# Patient Record
Sex: Female | Born: 1980
Health system: Southern US, Community
[De-identification: ages and names within clinical notes are randomized; demographics above are authoritative.]

## PROBLEM LIST (undated history)

## (undated) DIAGNOSIS — F3181 Bipolar II disorder: Secondary | ICD-10-CM

## (undated) DIAGNOSIS — J302 Other seasonal allergic rhinitis: Secondary | ICD-10-CM

## (undated) DIAGNOSIS — F319 Bipolar disorder, unspecified: Secondary | ICD-10-CM

## (undated) DIAGNOSIS — R519 Headache, unspecified: Secondary | ICD-10-CM

## (undated) DIAGNOSIS — F32A Depression, unspecified: Secondary | ICD-10-CM

## (undated) DIAGNOSIS — T7840XA Allergy, unspecified, initial encounter: Secondary | ICD-10-CM

## (undated) DIAGNOSIS — E785 Hyperlipidemia, unspecified: Secondary | ICD-10-CM

## (undated) DIAGNOSIS — F909 Attention-deficit hyperactivity disorder, unspecified type: Secondary | ICD-10-CM

## (undated) DIAGNOSIS — F419 Anxiety disorder, unspecified: Secondary | ICD-10-CM

## (undated) DIAGNOSIS — F329 Major depressive disorder, single episode, unspecified: Secondary | ICD-10-CM

## (undated) DIAGNOSIS — O139 Gestational [pregnancy-induced] hypertension without significant proteinuria, unspecified trimester: Secondary | ICD-10-CM

## (undated) DIAGNOSIS — B999 Unspecified infectious disease: Secondary | ICD-10-CM

## (undated) HISTORY — PX: APPENDECTOMY: SHX54

## (undated) HISTORY — DX: Attention-deficit hyperactivity disorder, unspecified type: F90.9

## (undated) HISTORY — DX: Depression, unspecified: F32.A

## (undated) HISTORY — PX: TONSILLECTOMY: SUR1361

## (undated) HISTORY — PX: BUNIONECTOMY: SHX129

## (undated) HISTORY — DX: Bipolar II disorder: F31.81

## (undated) HISTORY — PX: TUBAL LIGATION: SHX77

## (undated) HISTORY — DX: Other seasonal allergic rhinitis: J30.2

## (undated) HISTORY — DX: Hyperlipidemia, unspecified: E78.5

## (undated) HISTORY — DX: Anxiety disorder, unspecified: F41.9

## (undated) HISTORY — DX: Allergy, unspecified, initial encounter: T78.40XA

## (undated) HISTORY — DX: Major depressive disorder, single episode, unspecified: F32.9

---

## 1999-11-01 ENCOUNTER — Emergency Department (HOSPITAL_COMMUNITY): Admission: EM | Admit: 1999-11-01 | Discharge: 1999-11-01 | Payer: Self-pay | Admitting: Emergency Medicine

## 1999-11-01 ENCOUNTER — Encounter: Payer: Self-pay | Admitting: Emergency Medicine

## 2000-05-22 ENCOUNTER — Encounter (INDEPENDENT_AMBULATORY_CARE_PROVIDER_SITE_OTHER): Payer: Self-pay

## 2000-05-23 ENCOUNTER — Encounter: Payer: Self-pay | Admitting: Emergency Medicine

## 2000-05-23 ENCOUNTER — Inpatient Hospital Stay (HOSPITAL_COMMUNITY): Admission: EM | Admit: 2000-05-23 | Discharge: 2000-05-25 | Payer: Self-pay | Admitting: Emergency Medicine

## 2003-04-27 ENCOUNTER — Other Ambulatory Visit: Admission: RE | Admit: 2003-04-27 | Discharge: 2003-04-27 | Payer: Self-pay | Admitting: Unknown Physician Specialty

## 2004-05-21 ENCOUNTER — Other Ambulatory Visit: Admission: RE | Admit: 2004-05-21 | Discharge: 2004-05-21 | Payer: Self-pay | Admitting: Family Medicine

## 2005-09-16 ENCOUNTER — Other Ambulatory Visit: Admission: RE | Admit: 2005-09-16 | Discharge: 2005-09-16 | Payer: Self-pay | Admitting: Family Medicine

## 2006-09-13 ENCOUNTER — Other Ambulatory Visit: Admission: RE | Admit: 2006-09-13 | Discharge: 2006-09-13 | Payer: Self-pay | Admitting: Family Medicine

## 2007-04-05 ENCOUNTER — Ambulatory Visit (HOSPITAL_BASED_OUTPATIENT_CLINIC_OR_DEPARTMENT_OTHER): Admission: RE | Admit: 2007-04-05 | Discharge: 2007-04-05 | Payer: Self-pay | Admitting: Orthopedic Surgery

## 2007-10-24 ENCOUNTER — Other Ambulatory Visit: Admission: RE | Admit: 2007-10-24 | Discharge: 2007-10-24 | Payer: Self-pay | Admitting: Family Medicine

## 2007-12-26 ENCOUNTER — Other Ambulatory Visit: Admission: RE | Admit: 2007-12-26 | Discharge: 2007-12-26 | Payer: Self-pay | Admitting: Family Medicine

## 2008-10-29 ENCOUNTER — Other Ambulatory Visit: Admission: RE | Admit: 2008-10-29 | Discharge: 2008-10-29 | Payer: Self-pay | Admitting: Family Medicine

## 2009-03-11 ENCOUNTER — Encounter: Admission: RE | Admit: 2009-03-11 | Discharge: 2009-03-11 | Payer: Self-pay | Admitting: Family Medicine

## 2009-12-31 ENCOUNTER — Other Ambulatory Visit: Admission: RE | Admit: 2009-12-31 | Discharge: 2009-12-31 | Payer: Self-pay | Admitting: Family Medicine

## 2010-12-23 NOTE — Op Note (Signed)
Tanya Moore, Tanya Moore               ACCOUNT NO.:  [id number]   MEDICAL RECORD NO.:  [id number]          PATIENT TYPE:  AMB   LOCATION:  DSC                          FACILITY:  [facility]   PHYSICIAN:  Leonides Grills, M.D.     DATE OF BIRTH:  [birth year]   DATE OF PROCEDURE:  04/04/2007  DATE OF DISCHARGE:                               OPERATIVE REPORT   PREOPERATIVE DIAGNOSIS:  Left hallux valgus.   POSTOPERATIVE DIAGNOSIS:  Left hallux valgus.   OPERATION:  1. Left Chevron bunionectomy.  2. Stress x-rays, left foot.   ANESTHESIA:  General.   SURGEON:  Leonides Grills, MD   ASSISTANT:  Evlyn Kanner, P.A.-C   ESTIMATED BLOOD LOSS:  Minimal.   TOURNIQUET TIME:  Approximately 40 minutes.   COMPLICATIONS:  None.   DISPOSITION:  Stable to PAR.   INDICATIONS:  This is a 30 year old female who has had longstanding left  bunion pain that is interfering with her life.  We were contemplating  doing a proximal metatarsal osteotomy if her correction was not  adequately corrected with a distal metatarsal osteotomy.  She had an  elevated DMMA angle to the metatarsal head as well, and this would also  have to be factored into the correction.  She was consented for the  above procedure.  All risks, which include infection, nerve or vessel  injury, nonunion, malunion, hardware rotation, hardware failure,  persistent pain, worse pain, prolonged recovery, stiffness, and  arthritis, were all explained.  Questions were encouraged and answered.   OPERATION:  The patient was brought to the operating and placed in  supine position after adequate general endotracheal tube anesthesia was  administered as well as Ancef 1 g IV piggyback.  The left lower  extremity is then prepped and draped in a sterile manner over a  proximally-placed thigh tourniquet.  The limb was gravity-exsanguinated.  The tourniquet was elevated to 290 mmHg.  A longitudinal incision over  the medial midline over the medial aspect of  the left great toe MTP  joint was then made.  Dissection was carried down through skin.  Hemostasis was obtained.  Neurovascular structures were identified both  superiorly and inferiorly and protected throughout the case.  L-shaped  capsulotomy was then made.  Simple bunionectomy was then performed with  a sagittal saw.  Rocky Link Johnson's ridge was then rounded off with a  rongeur.  The area and joint were copiously irrigated with be normal  saline.  The lateral capsule was then released with a curved Beaver  blade.  This had an excellent release of the tight capsule.  The area  was copiously irrigated with normal saline.  We then found the center of  the head medially.  A Chevron osteotomy was then made with a sagittal  saw.  We then took off more bone medially to correct the DMMA angle, and  this closed down beautifully.  The osteotomy was then verified to be in  a proper position with x-ray.  We then fixed this was a 2.0 mm fully-  threaded cortical screw with a 1.5-mm drill hole, respectively.  This  was countersunk.  This had excellent purchase and maintenance of the  correction.  The redundant bone medially was then trimmed off with a  sagittal saw.  The joint and area were copiously irrigated with normal  saline.  The capsule was advanced both superiorly and proximally and  repaired with a 2-0 Vicryl stitch.  This had an Conservation officer, historic buildings.  We  then obtained stress x-ray in AP and lateral views that showed no gross  motion across the osteotomy site, fixation, proper position and  excellent alignment as well.  Sesamoids were located.  It was the  determination at this point that we made over-correct with a proximal  metatarsal osteotomy.  We decided not to perform this.  This was  discussed with the patient as well preoperatively.  The tourniquet was  deflated.  Hemostasis was obtained.  The skin was closed with 4-0 nylon  stitch.  Sterile dressing was applied.  Roger Mann dressing was  applied.  Hard-sole shoe was applied.  The patient was stable to the PAR.      Leonides Grills, M.D.  Electronically Signed     PB/MEDQ  D:  04/05/2007  T:  04/05/2007  Job:  [id number]

## 2010-12-26 NOTE — Op Note (Signed)
[facility]  Patient:    Tanya Moore, Tanya Moore                      MRN: [id number] Proc. Date: 05/23/00 Adm. Date:  [id number] Attending:  Carson Myrtle                           Operative Report  PREOPERATIVE DIAGNOSIS:  Acute appendicitis.  POSTOPERATIVE DIAGNOSIS:  Acute appendicitis.  PROCEDURE:  Laparoscopic appendectomy.  SURGEON:  Timothy E. Earlene Plater, M.D.  ANESTHESIA:  General.  INDICATION:  [name] was seen and evaluated in the emergency room, admitted directly to the operating room for appendectomy.  She is a 30 year old Caucasian female from [place], in school at [employer], otherwise healthy, no prior surgeries, and no contributing comorbidities.  DESCRIPTION OF PROCEDURE:  The patient was brought to the operating room, placed supine, and general endotracheal anesthesia administered, a Foley catheter and orogastric tube applied.  The abdomen was scrubbed, prepped and draped in the usual fashion.  Marcaine 0.25% was used prior to each puncture. The infraumbilical incision made horizontally, fascia identified, and opened vertically, peritoneum entered without complication.  Hasson catheter placed, tied in place, the abdomen insufflated.  Peritoneoscopy revealed some omental caking in the right lower quadrant over the cecum.  The gallbladder was intrahepatic.  The uterus and both tubes and ovaries appeared normal.  No other pathology was noted.  A 5 mm trocar placed in the right upper quadrant and a 12 mm trocar placed in the left lower quadrant.  The omentum was removed.  The appendix was inflamed at its midportion.  The appendix had some congenital adhesions to the right abdominal wall.  These were taken down sharply with cautery.  The appendix was then free.  A window was made in the mesentery and using two firings of the GIA staple device, the mesentery and then the appendix was cut across and divided.  The appendix was placed  in an Endocatch bag.  Then attention was turned to the operative sites.  There was a bit of bleeding at the suture line on the appendix.  This was touched with cautery and stopped immediately.  Copious irrigation was carried out.  The appendix was then removed in the Endocatch bag through the left lower quadrant puncture site.  There was some bleeding on the peritoneal side.  This was cauterized under direct vision.  Irrigation, CO2, instruments, and trocars all removed under direct vision.  The left lower quadrant site was then inspected. Some bleeding at the skin level was cauterized, and the fascia was closed with a single suture of #1 Vicryl.  The infraumbilical site was closed with a #1 Vicryl as well.  Some bleeding at the skin site there was cauterized, and some bleeding at the right upper quadrant puncture site was cauterized.  These wounds were now dry.  Counts were correct.  All the skin incisions were closed with interrupted 4-0 Vicryl.  Steri-Strips applied.  Dry sterile dressings applied.  She tolerated it well, was awakened, and sent to the recovery room in good condition. DD:  05/23/00 TD:  05/24/00 Job: [id number]

## 2012-01-25 ENCOUNTER — Other Ambulatory Visit: Payer: Self-pay | Admitting: Family Medicine

## 2012-01-25 ENCOUNTER — Other Ambulatory Visit (HOSPITAL_COMMUNITY)
Admission: RE | Admit: 2012-01-25 | Discharge: 2012-01-25 | Disposition: A | Discharge status: Home / Self Care | Payer: BC Managed Care – PPO | Source: Ambulatory Visit | Attending: Family Medicine | Admitting: Family Medicine

## 2012-01-25 DIAGNOSIS — Z124 Encounter for screening for malignant neoplasm of cervix: Secondary | ICD-10-CM | POA: Insufficient documentation

## 2012-01-25 DIAGNOSIS — Z1159 Encounter for screening for other viral diseases: Secondary | ICD-10-CM | POA: Insufficient documentation

## 2012-01-25 DIAGNOSIS — Z113 Encounter for screening for infections with a predominantly sexual mode of transmission: Secondary | ICD-10-CM | POA: Insufficient documentation

## 2013-05-06 ENCOUNTER — Ambulatory Visit (INDEPENDENT_AMBULATORY_CARE_PROVIDER_SITE_OTHER): Payer: BC Managed Care – PPO | Admitting: Family Medicine

## 2013-05-06 VITALS — BP 110/62 | HR 82 | Temp 98.9°F | Resp 16 | Wt 143.2 lb

## 2013-05-06 DIAGNOSIS — J329 Chronic sinusitis, unspecified: Secondary | ICD-10-CM

## 2013-05-06 MED ORDER — AMOXICILLIN 875 MG PO TABS: 875.0000 mg | ORAL_TABLET | Freq: Two times a day (BID) | ORAL | Status: DC

## 2013-05-06 MED ORDER — HYDROCODONE-HOMATROPINE 5-1.5 MG/5ML PO SYRP: 5.0000 mL | ORAL_SOLUTION | Freq: Three times a day (TID) | ORAL | Status: DC | PRN

## 2013-05-06 NOTE — Progress Notes (Signed)
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   The vomiting has cleared now, but nausea still comes on occasionally.  F/H:  Heart disease PMHx:  High cholesterol  Objective:  NAD HEENT:  Swollen nasal passages, mildly erythematous throat Chest:  No rales, few ronchi Heart:  Reg, no murmur Skin:  Diffuse tattoos, no rash  Assessment: sinus infection  Plan:  Amoxicillin and hydromet Off work until Monday. Sinusitis - Plan: amoxicillin (AMOXIL) 875 MG tablet, HYDROcodone-homatropine (HYCODAN) 5-1.5 MG/5ML syrup  Signed, Elvina Sidle, MD

## 2018-08-10 DIAGNOSIS — L409 Psoriasis, unspecified: Secondary | ICD-10-CM

## 2018-08-10 HISTORY — DX: Psoriasis, unspecified: L40.9

## 2018-10-26 ENCOUNTER — Telehealth: Payer: Self-pay | Admitting: Family Medicine

## 2018-10-26 DIAGNOSIS — F319 Bipolar disorder, unspecified: Secondary | ICD-10-CM

## 2018-10-26 NOTE — Telephone Encounter (Signed)
Patient called and left message on her phone explaining that we are trying to offer options to avoid unnecessary in person visits if there needs could be met over the phone given everything going on with a coronavirus.  Explained that we are clearly not refusing to see them that we are still welcome to see them in person if that is what they want but if they think that we can manage some issues over the phone they can call and we can reschedule her visit to a later time when there is less risk here in the office.  Dr.  

## 2018-10-26 NOTE — Telephone Encounter (Signed)
Patient was scheduled for establishing care visit as a new patient.  She had moved away years ago and has her new PCP for moving back in town.  Main concern was around trying to transition her primary mental health care, she is currently at [facility] but is not pleased with her experience there and would like to try and get a new psychiatrist.  She says that she is bipolar 1, says she is safe with no SI HI and is on multiple medications which she cannot remember all of them off the top of her head but that the psychiatric team will continue to manage her psychiatric medications.  I told her that we could place a referral for that and that it might take weeks to months to get a new person as she is on Medicaid and she is comfortable without currently and will continue with [facility] until that happens.  Only other medical term that she mentions is that she has a history of high blood cholesterol she said that she used to be on atorvastatin which she has not had lab work in years.  She does not think that this is an immediate concern enough that we need to stress over this in the next 5 or 6 weeks while the coronavirus crisis is going on.  So she will schedule an appointment at a later date to come in and get her establishing blood work set.  She understands that she is able to come and see [place] in person if she has a visit or need that should happen face-to-face she is comfortable with that at this time.  Dr. Parke Simmers

## 2018-10-28 ENCOUNTER — Ambulatory Visit: Payer: Self-pay | Admitting: Family Medicine

## 2018-11-07 ENCOUNTER — Telehealth: Payer: Self-pay | Admitting: Family Medicine

## 2018-11-07 DIAGNOSIS — Z3490 Encounter for supervision of normal pregnancy, unspecified, unspecified trimester: Secondary | ICD-10-CM

## 2018-11-07 NOTE — Telephone Encounter (Signed)
Patient calls in requesting referral to OB because she has found she is pregnant.  -referal placed  Dr. Parke Simmers

## 2018-11-07 NOTE — Telephone Encounter (Signed)
Patient called stating that she just found out that she's pregnant. Patient also stated that she doesn't have an OB and would like a referral.

## 2018-11-08 ENCOUNTER — Other Ambulatory Visit: Payer: Self-pay

## 2018-11-08 ENCOUNTER — Encounter (HOSPITAL_COMMUNITY): Payer: Self-pay | Admitting: *Deleted

## 2018-11-08 ENCOUNTER — Inpatient Hospital Stay (HOSPITAL_COMMUNITY)
Admission: AD | Admit: 2018-11-08 | Discharge: 2018-11-08 | Disposition: A | Discharge status: Home / Self Care | Payer: Medicaid Other | Attending: Obstetrics & Gynecology | Admitting: Obstetrics & Gynecology

## 2018-11-08 ENCOUNTER — Telehealth: Payer: Self-pay

## 2018-11-08 ENCOUNTER — Inpatient Hospital Stay (HOSPITAL_COMMUNITY): Payer: Medicaid Other

## 2018-11-08 DIAGNOSIS — Z882 Allergy status to sulfonamides status: Secondary | ICD-10-CM | POA: Diagnosis not present

## 2018-11-08 DIAGNOSIS — Z87891 Personal history of nicotine dependence: Secondary | ICD-10-CM | POA: Insufficient documentation

## 2018-11-08 DIAGNOSIS — F419 Anxiety disorder, unspecified: Secondary | ICD-10-CM | POA: Insufficient documentation

## 2018-11-08 DIAGNOSIS — E785 Hyperlipidemia, unspecified: Secondary | ICD-10-CM | POA: Diagnosis not present

## 2018-11-08 DIAGNOSIS — Z3A01 Less than 8 weeks gestation of pregnancy: Secondary | ICD-10-CM | POA: Insufficient documentation

## 2018-11-08 DIAGNOSIS — F319 Bipolar disorder, unspecified: Secondary | ICD-10-CM | POA: Diagnosis not present

## 2018-11-08 DIAGNOSIS — O4691 Antepartum hemorrhage, unspecified, first trimester: Secondary | ICD-10-CM | POA: Insufficient documentation

## 2018-11-08 DIAGNOSIS — Z79899 Other long term (current) drug therapy: Secondary | ICD-10-CM | POA: Diagnosis not present

## 2018-11-08 DIAGNOSIS — Z8042 Family history of malignant neoplasm of prostate: Secondary | ICD-10-CM | POA: Insufficient documentation

## 2018-11-08 DIAGNOSIS — O99281 Endocrine, nutritional and metabolic diseases complicating pregnancy, first trimester: Secondary | ICD-10-CM | POA: Insufficient documentation

## 2018-11-08 DIAGNOSIS — O99341 Other mental disorders complicating pregnancy, first trimester: Secondary | ICD-10-CM | POA: Insufficient documentation

## 2018-11-08 DIAGNOSIS — Z792 Long term (current) use of antibiotics: Secondary | ICD-10-CM | POA: Insufficient documentation

## 2018-11-08 DIAGNOSIS — Z6740 Type O blood, Rh positive: Secondary | ICD-10-CM | POA: Diagnosis not present

## 2018-11-08 DIAGNOSIS — O3680X0 Pregnancy with inconclusive fetal viability, not applicable or unspecified: Secondary | ICD-10-CM | POA: Insufficient documentation

## 2018-11-08 DIAGNOSIS — Z818 Family history of other mental and behavioral disorders: Secondary | ICD-10-CM | POA: Insufficient documentation

## 2018-11-08 DIAGNOSIS — O4690 Antepartum hemorrhage, unspecified, unspecified trimester: Secondary | ICD-10-CM

## 2018-11-08 HISTORY — DX: Bipolar disorder, unspecified: F31.9

## 2018-11-08 LAB — CBC
HCT: 40.8 % (ref 36.0–46.0)
Hemoglobin: 13.3 g/dL (ref 12.0–15.0)
MCH: 28.5 pg (ref 26.0–34.0)
MCHC: 32.6 g/dL (ref 30.0–36.0)
MCV: 87.4 fL (ref 80.0–100.0)
NRBC: 0 % (ref 0.0–0.2)
PLATELETS: 315 10*3/uL (ref 150–400)
RBC: 4.67 MIL/uL (ref 3.87–5.11)
RDW: 13 % (ref 11.5–15.5)
WBC: 9.3 10*3/uL (ref 4.0–10.5)

## 2018-11-08 LAB — URINALYSIS, ROUTINE W REFLEX MICROSCOPIC
BILIRUBIN URINE: NEGATIVE
Bacteria, UA: NONE SEEN
GLUCOSE, UA: NEGATIVE mg/dL
Ketones, ur: NEGATIVE mg/dL
Leukocytes,Ua: NEGATIVE
NITRITE: NEGATIVE
PH: 7 (ref 5.0–8.0)
Protein, ur: NEGATIVE mg/dL
SPECIFIC GRAVITY, URINE: 1.012 (ref 1.005–1.030)

## 2018-11-08 LAB — HCG, QUANTITATIVE, PREGNANCY: hCG, Beta Chain, Quant, S: 131 m[IU]/mL — ABNORMAL HIGH (ref <5)

## 2018-11-08 LAB — ABO/RH: ABO/RH(D): O POS

## 2018-11-08 LAB — WET PREP, GENITAL
Clue Cells Wet Prep HPF POC: NONE SEEN
SPERM: NONE SEEN
Trich, Wet Prep: NONE SEEN
Yeast Wet Prep HPF POC: NONE SEEN

## 2018-11-08 LAB — POCT PREGNANCY, URINE: Preg Test, Ur: POSITIVE — AB

## 2018-11-08 NOTE — MAU Note (Signed)
Is 6 wks preg. Has not been seen by a doc yet.  Has been spotting for the past wk and a half. Called primary for a referral , was instructed to come here. No pain, a little pressure.

## 2018-11-08 NOTE — MAU Provider Note (Signed)
History     CSN: [id number]  Arrival date and time: 11/08/18 1546   First Provider Initiated Contact with Patient 11/08/18 1641      Chief Complaint  Patient presents with  . Vaginal Bleeding   Tanya Moore is a 38 y.o. G2P1001 at [redacted]w[redacted]d (by lmp) who presents today with spotting and cramping.   Vaginal Bleeding  The patient's primary symptoms include vaginal bleeding. The patient's pertinent negatives include no pelvic pain. This is a new problem. Episode onset: two weeks  The problem occurs intermittently. The problem has been unchanged. The patient is experiencing no pain ("some uncomfortable sensation in lower abdomen"). The problem affects both sides. She is pregnant. Associated symptoms include nausea. Pertinent negatives include no chills, dysuria, fever, frequency or vomiting. The vaginal discharge was bloody and brown. The vaginal bleeding is spotting. She has not been passing clots. She has not been passing tissue. Nothing aggravates the symptoms. She has tried nothing for the symptoms. Contraceptive use: Mirena IUD removed in January  Her menstrual history has been regular (LMP 09/25/2018).    OB History    Gravida  2   Para  1   Term  1   Preterm      AB      Living  1     SAB      TAB      Ectopic      Multiple      Live Births              Past Medical History:  Diagnosis Date  . Allergy   . Anxiety   . Bipolar 1 disorder (HCC)   . Depression   . Hyperlipidemia     Past Surgical History:  Procedure Laterality Date  . APPENDECTOMY    . BUNIONECTOMY      Family History  Problem Relation Age of Onset  . Hypertension Mother   . Hypertension Father   . Cancer Father        prostate  . Depression Sister     Social History   Tobacco Use  . Smoking status: Former Smoker    Last attempt to quit: 2017    Years since quitting: 3.2  . Smokeless tobacco: Never Used  Substance Use Topics  . Alcohol use: No  . Drug use: No     Allergies:  Allergies  Allergen Reactions  . Lamictal [Lamotrigine] Rash    Patient reported    Medications Prior to Admission  Medication Sig Dispense Refill Last Dose  . amoxicillin (AMOXIL) 875 MG tablet Take 1 tablet (875 mg total) by mouth 2 (two) times daily. 20 tablet 0   . atorvastatin (LIPITOR) 10 MG tablet Take 10 mg by mouth daily.   Taking  . HYDROcodone-homatropine (HYCODAN) 5-1.5 MG/5ML syrup Take 5 mLs by mouth every 8 (eight) hours as needed for cough. 120 mL 0   . Multiple Vitamins-Minerals (MULTIVITAMIN WITH MINERALS) tablet Take 1 tablet by mouth daily.   Taking  . Norgestimate-Ethinyl Estradiol Triphasic (TRINESSA, 28,) 0.18/0.215/0.25 MG-35 MCG tablet Take 1 tablet by mouth daily.   Taking  . VITAMIN D, CHOLECALCIFEROL, PO Take by mouth.   Taking    Review of Systems  Constitutional: Negative for chills and fever.  Gastrointestinal: Positive for nausea. Negative for vomiting.  Genitourinary: Positive for vaginal bleeding. Negative for dysuria, frequency and pelvic pain.   Physical Exam   Blood pressure (!) 144/86, pulse 78, temperature 98.9 F (37.2 C), temperature  source Oral, resp. rate 18, height 5\' 4"  (1.626 m), last menstrual period 09/25/2018, SpO2 100 %.  Physical Exam  Nursing note and vitals reviewed. Constitutional: She is oriented to person, place, and time. She appears well-developed and well-nourished. No distress.  HENT:  Head: Normocephalic.  Cardiovascular: Normal rate.  Respiratory: Effort normal.  GI: Soft. There is no abdominal tenderness. There is no rebound.  Neurological: She is alert and oriented to person, place, and time.  Skin: Skin is warm and dry.  Psychiatric: She has a normal mood and affect.   Results for orders placed or performed during the hospital encounter of 11/08/18 (from the past 24 hour(s))  Urinalysis, Routine w reflex microscopic     Status: Abnormal   Collection Time: 11/08/18  4:04 PM  Result Value Ref  Range   Color, Urine YELLOW YELLOW   APPearance HAZY (A) CLEAR   Specific Gravity, Urine 1.012 1.005 - 1.030   pH 7.0 5.0 - 8.0   Glucose, UA NEGATIVE NEGATIVE mg/dL   Hgb urine dipstick MODERATE (A) NEGATIVE   Bilirubin Urine NEGATIVE NEGATIVE   Ketones, ur NEGATIVE NEGATIVE mg/dL   Protein, ur NEGATIVE NEGATIVE mg/dL   Nitrite NEGATIVE NEGATIVE   Leukocytes,Ua NEGATIVE NEGATIVE   RBC / HPF 0-5 0 - 5 RBC/hpf   WBC, UA 0-5 0 - 5 WBC/hpf   Bacteria, UA NONE SEEN NONE SEEN   Squamous Epithelial / LPF 0-5 0 - 5   Mucus PRESENT   Pregnancy, urine POC     Status: Abnormal   Collection Time: 11/08/18  4:13 PM  Result Value Ref Range   Preg Test, Ur POSITIVE (A) NEGATIVE  CBC     Status: None   Collection Time: 11/08/18  4:48 PM  Result Value Ref Range   WBC 9.3 4.0 - 10.5 K/uL   RBC 4.67 3.87 - 5.11 MIL/uL   Hemoglobin 13.3 12.0 - 15.0 g/dL   HCT 74.0 81.4 - 48.1 %   MCV 87.4 80.0 - 100.0 fL   MCH 28.5 26.0 - 34.0 pg   MCHC 32.6 30.0 - 36.0 g/dL   RDW 85.6 31.4 - 97.0 %   Platelets 315 150 - 400 K/uL   nRBC 0.0 0.0 - 0.2 %  hCG, quantitative, pregnancy     Status: Abnormal   Collection Time: 11/08/18  4:48 PM  Result Value Ref Range   hCG, Beta Chain, Quant, S 131 (H) <5 mIU/mL  Wet prep, genital     Status: Abnormal   Collection Time: 11/08/18  5:00 PM  Result Value Ref Range   Yeast Wet Prep HPF POC NONE SEEN NONE SEEN   Trich, Wet Prep NONE SEEN NONE SEEN   Clue Cells Wet Prep HPF POC NONE SEEN NONE SEEN   WBC, Wet Prep HPF POC FEW (A) NONE SEEN   Sperm NONE SEEN   ABO/Rh     Status: None   Collection Time: 11/08/18  5:18 PM  Result Value Ref Range   ABO/RH(D)      O POS Performed at [facility], [address], [place], [place] [address]    US Ob Less Than 14 Weeks With Ob Transvaginal  Result Date: 11/08/2018 CLINICAL DATA:  Vaginal bleeding in first trimester of pregnancy, spotting for 1.5 weeks; EGA [redacted] weeks 2 days by LMP of 09/25/2018; no quantitative  beta HCG for correlation EXAM: OBSTETRIC <14 WK [place] AND TRANSVAGINAL OB US TECHNIQUE: Both transabdominal and transvaginal ultrasound examinations were  performed for complete evaluation of the gestation as well as the maternal uterus, adnexal regions, and pelvic cul-de-sac. Transvaginal technique was performed to assess early pregnancy. COMPARISON:  None FINDINGS: Intrauterine gestational sac: None identified Yolk sac:  N/A Embryo:  N/A Cardiac Activity: N/A Heart Rate: N/A  bpm MSD:   mm    w     d CRL:    mm    w    d                  [place] EDC: Subchorionic hemorrhage:  N/A Maternal uterus/adnexae: Uterus normal morphology without mass or fluid collection. No gestational sac identified. LEFT ovary suboptimally visualized due to position, 2.0 x 1.4 x 1.4 cm without focal mass. RIGHT ovary normal size and morphology 2.0 x 2.3 x 1.3 cm. No adnexal masses. Trace free pelvic fluid. IMPRESSION: No intrauterine gestation identified. Findings are compatible with pregnancy of unknown location. Differential diagnosis includes early intrauterine pregnancy too early to visualize, spontaneous abortion, and ectopic pregnancy. Serial quantitative beta hCG and or followup ultrasound recommended to definitively exclude ectopic pregnancy. Electronically Signed   By: Ulyses Southward M.D.   On: 11/08/2018 17:48    MAU Course  Procedures  MDM   Assessment and Plan   1. Pregnancy, location unknown   2. Vaginal bleeding in pregnancy   3. Type O blood, Rh positive    DC home Comfort measures reviewed  1st Trimester precautions  Bleeding precautions Ectopic precautions RX: none  Return to MAU as needed Repeat hcg in 48 hours at the [facility]  Follow-up Information    [facility] Follow up.   Specialty:  Obstetrics and Gynecology Why:  FRIDAY 11/11/2018 FOR REPEAT BLOOD WORK  Contact information: [address] [place] [address] [phone]         Rosann Gorum DNP, CNM   11/08/18  6:32 PM

## 2018-11-08 NOTE — Discharge Instructions (Signed)

## 2018-11-08 NOTE — Telephone Encounter (Signed)
Pt called nurse line stating she is newly pregnant and has been having some "continuous" spotting. Pt has not established care here yet, due to covid. Informed pt she should go to MAU. Pt verbalized understanding.

## 2018-11-09 LAB — GC/CHLAMYDIA PROBE AMP (~~LOC~~) NOT AT ARMC
CHLAMYDIA, DNA PROBE: NEGATIVE
NEISSERIA GONORRHEA: NEGATIVE

## 2018-11-11 ENCOUNTER — Ambulatory Visit (INDEPENDENT_AMBULATORY_CARE_PROVIDER_SITE_OTHER): Payer: Medicaid Other

## 2018-11-11 ENCOUNTER — Other Ambulatory Visit: Payer: Self-pay

## 2018-11-11 ENCOUNTER — Inpatient Hospital Stay (HOSPITAL_COMMUNITY)
Admission: AD | Admit: 2018-11-11 | Discharge: 2018-11-11 | Disposition: A | Discharge status: Home / Self Care | Payer: Medicaid Other | Attending: Obstetrics & Gynecology | Admitting: Obstetrics & Gynecology

## 2018-11-11 DIAGNOSIS — F319 Bipolar disorder, unspecified: Secondary | ICD-10-CM | POA: Diagnosis not present

## 2018-11-11 DIAGNOSIS — Z6790 Unspecified blood type, Rh positive: Secondary | ICD-10-CM | POA: Insufficient documentation

## 2018-11-11 DIAGNOSIS — F419 Anxiety disorder, unspecified: Secondary | ICD-10-CM | POA: Insufficient documentation

## 2018-11-11 DIAGNOSIS — O3680X0 Pregnancy with inconclusive fetal viability, not applicable or unspecified: Secondary | ICD-10-CM

## 2018-11-11 DIAGNOSIS — O009 Unspecified ectopic pregnancy without intrauterine pregnancy: Secondary | ICD-10-CM | POA: Diagnosis not present

## 2018-11-11 DIAGNOSIS — Z3A01 Less than 8 weeks gestation of pregnancy: Secondary | ICD-10-CM

## 2018-11-11 DIAGNOSIS — O360910 Maternal care for other rhesus isoimmunization, first trimester, not applicable or unspecified: Secondary | ICD-10-CM | POA: Diagnosis not present

## 2018-11-11 LAB — COMPREHENSIVE METABOLIC PANEL
ALT: 19 U/L (ref 0–44)
AST: 20 U/L (ref 15–41)
Albumin: 4.1 g/dL (ref 3.5–5.0)
Alkaline Phosphatase: 78 U/L (ref 38–126)
Anion gap: 9 (ref 5–15)
BUN: 5 mg/dL — ABNORMAL LOW (ref 6–20)
CO2: 28 mmol/L (ref 22–32)
Calcium: 9.2 mg/dL (ref 8.9–10.3)
Chloride: 103 mmol/L (ref 98–111)
Creatinine, Ser: 0.67 mg/dL (ref 0.44–1.00)
GFR calc Af Amer: >60 mL/min (ref >60)
GFR calc non Af Amer: >60 mL/min (ref >60)
Glucose, Bld: 105 mg/dL — ABNORMAL HIGH (ref 70–99)
Potassium: 4.1 mmol/L (ref 3.5–5.1)
Sodium: 140 mmol/L (ref 135–145)
Total Bilirubin: 0.4 mg/dL (ref 0.3–1.2)
Total Protein: 6.9 g/dL (ref 6.5–8.1)

## 2018-11-11 LAB — BETA HCG QUANT (REF LAB): hCG Quant: 147 m[IU]/mL

## 2018-11-11 MED ORDER — METHOTREXATE FOR ECTOPIC PREGNANCY
50.0000 mg/m2 | Freq: Once | INTRAMUSCULAR | Status: AC
  Administered 2018-11-11: 90 mg via INTRAMUSCULAR
  Filled 2018-11-11: qty 1

## 2018-11-11 NOTE — Discharge Instructions (Signed)
Methotrexate Treatment for an Ectopic Pregnancy, Care After °This sheet gives you information about how to care for yourself after your procedure. Your health care provider may also give you more specific instructions. If you have problems or questions, contact your health care provider. °What can I expect after the procedure? °After the procedure, it is common to have: °· Abdominal cramping. °· Vaginal bleeding. °· Fatigue. °· Nausea. °· Vomiting. °· Diarrhea. °Blood tests will be taken at timed intervals for several days or weeks to check your pregnancy hormone levels. The blood tests will be done until the pregnancy hormone can no longer be detected in the blood. °Follow these instructions at home: °Activity °· Do not have sex until your health care provider approves. °· Limit activities that take a lot of effort as told by your health care provider. °Medicines °· Take over the counter and prescription medicines only as told by your health care provider. °· Do not take aspirin, ibuprofen, naproxen, or any other NSAIDs. °· Do not take folic acid, prenatal vitamins, or other vitamins that contain folic acid. °General instructions ° °· Do not drink alcohol. °· Follow instructions from your health care provider on how and when to report any symptoms that may indicate a ruptured ectopic pregnancy. °· Keep all follow-up visits as told by your health care provider. This is important. °Contact a health care provider if: °· You have persistent nausea and vomiting. °· You have persistent diarrhea. °· You are having a reaction to the medicine, such as: °? Tiredness. °? Skin rash. °? Hair loss. °Get help right away if: °· Your abdominal or pelvic pain gets worse. °· You have more vaginal bleeding. °· You feel light-headed or you faint. °· You have shortness of breath. °· Your heart rate increases. °· You develop a cough. °· You have chills. °· You have a fever. °Summary °· After the procedure, it is common to have symptoms  of abdominal cramping, vaginal bleeding and fatigue. You may also experience other symptoms. °· Blood tests will be taken at timed intervals for several days or weeks to check your pregnancy hormone levels. The blood tests will be done until the pregnancy hormone can no longer be detected in the blood. °· Limit strenuous activity as told by your health care provider. °· Follow instructions from your health care provider on how and when to report any symptoms that may indicate a ruptured ectopic pregnancy. °This information is not intended to replace advice given to you by your health care provider. Make sure you discuss any questions you have with your health care provider. °Document Released: 07/16/2011 Document Revised: 09/15/2016 Document Reviewed: 09/15/2016 °Elsevier Interactive Patient Education © 2019 [employer] ° °

## 2018-11-11 NOTE — MAU Provider Note (Addendum)
Tanya Moore  is a 38 y.o. G2P1001 at 47w5dwho presents to MAU today for MTX. [facility] rise in quant suspicious for ectopic. MTX was recommended by Dr. DRosana Hoes Pt reports light VB, no pain.   OB History  Gravida Para Term Preterm AB Living  2 1 1     1   SAB TAB Ectopic Multiple Live Births               # Outcome Date GA Lbr Len/2nd Weight Sex Delivery Anes PTL Lv  2 Current           1 Term      CS-Unspec       Past Medical History:  Diagnosis Date  . Allergy   . Anxiety   . Bipolar 1 disorder ([place]   . Depression   . Hyperlipidemia     ROS: + VB no pain  PE: BP 120/79   Pulse 91   Temp 98.3 F (36.8 C)   Resp 18   Ht 5' 4"  (1.626 m)   Wt 74.8 kg   LMP 09/25/2018   SpO2 100%   BMI 28.32 kg/m   CONSTITUTIONAL: Well-developed, well-nourished female in no acute distress.  MUSCULOSKELETAL: Normal range of motion.  CARDIOVASCULAR: Regular heart rate RESPIRATORY: Normal effort NEUROLOGICAL: Alert and oriented to person, place, and time.  SKIN: Not diaphoretic. No erythema. No pallor. PSYCH: Normal mood and affect. Normal behavior. Normal judgment and thought content.  Results for orders placed or performed during the hospital encounter of 11/11/18 (from the past 24 hour(s))  Comprehensive metabolic panel     Status: Abnormal   Collection Time: 11/11/18 12:49 PM  Result Value Ref Range   Sodium 140 135 - 145 mmol/L   Potassium 4.1 3.5 - 5.1 mmol/L   Chloride 103 98 - 111 mmol/L   CO2 28 22 - 32 mmol/L   Glucose, Bld 105 (H) 70 - 99 mg/dL   BUN 5 (L) 6 - 20 mg/dL   Creatinine, Ser 0.67 0.44 - 1.00 mg/dL   Calcium 9.2 8.9 - 10.3 mg/dL   Total Protein 6.9 6.5 - 8.1 g/dL   Albumin 4.1 3.5 - 5.0 g/dL   AST 20 15 - 41 U/L   ALT 19 0 - 44 U/L   Alkaline Phosphatase 78 38 - 126 U/L   Total Bilirubin 0.4 0.3 - 1.2 mg/dL   GFR calc non Af Amer >60 >60 mL/min   GFR calc Af Amer >60 >60 mL/min   Anion gap 9 5 - 15   MDM:   Orders Placed This Encounter  Procedures  . Comprehensive metabolic panel    Standing Status:   Standing    Number of Occurrences:   1  . Discharge patient    Order Specific Question:   Discharge disposition    Answer:   01-Home or Self Care [1]    Order Specific Question:   Discharge patient date    Answer:   11/11/2018   Meds ordered this encounter  Medications  . methotrexate ([facility] chemo injection kit 90 mg   Labs ordered and reviewed. MTX given. Stable for discharge home.  A: 1. Ectopic pregnancy without intrauterine pregnancy, unspecified location   2. Blood type, Rh positive    P: Discharge home Ectopic precautions discussed [place]  Post MTX instructions provided Patient may return to MAU as needed  or if her condition were to change or worsen   Julianne Handler, [place] 11/11/2018 2:07 PM

## 2018-11-11 NOTE — Progress Notes (Signed)
Pt here today for STAT Beta Lab.  Pt reports mild vaginal bleeding that she wears a panty liner.  Pt advised to wait in the car for two hours for her results and f/u.  Pt agreed.   Received critical value 147 for beta lab from [facility].  Notified Dr. Earlene Plater, who recommends pt to receive MTX tx.  Notified pt provider's recommendation.  MAU charge nurse notified.   Pt notified to go to MAU now for MTX tx.  Pt verbalized understanding with no further questions.    Addison Naegeli, RN

## 2018-11-11 NOTE — MAU Note (Signed)
Pt sent to MAU for methotrexate injection. Denies pain, scant amount of vaginal bleeding

## 2018-11-12 NOTE — Progress Notes (Signed)
I have reviewed this chart and agree with the RN/CMA assessment and management.    K. Meryl Kimi Bordeau, M.D. [facility] (Faculty Practice)   

## 2018-11-14 ENCOUNTER — Telehealth: Payer: Self-pay | Admitting: General Practice

## 2018-11-14 ENCOUNTER — Other Ambulatory Visit: Payer: Self-pay

## 2018-11-14 ENCOUNTER — Other Ambulatory Visit (INDEPENDENT_AMBULATORY_CARE_PROVIDER_SITE_OTHER): Payer: Medicaid Other | Admitting: General Practice

## 2018-11-14 DIAGNOSIS — O00109 Unspecified tubal pregnancy without intrauterine pregnancy: Secondary | ICD-10-CM

## 2018-11-14 LAB — BETA HCG QUANT (REF LAB): hCG Quant: 180 m[IU]/mL

## 2018-11-14 NOTE — Telephone Encounter (Signed)
Called patient with bhcg results & discussed follow up on 4/9 in MAU for day #7 labs. Discussed returning to MAU sooner for severe pain or heavy bleeding. Patient verbalized understanding & had no questions.

## 2018-11-14 NOTE — Progress Notes (Signed)
Patient presents to office today for stat bhcg day #4 labs following MTX on 3/31. Patient reports bleeding has stopped but has had some continued left lower quadrant cramping. Discussed with patient we are monitoring your bhcg levels today, results/history will be reviewed with a provider in the office and we will call her with results around 1030. Patient verbalized understanding & reports [phone] is the best contact number.   Reviewed results with Dr Adrian Blackwater who finds slight increase in bhcg levels- patient should follow up on 4/9 for day #7 labs. Will call patient with results/follow up information.  Chase Caller RN BSN 11/14/18

## 2018-11-17 ENCOUNTER — Inpatient Hospital Stay (HOSPITAL_COMMUNITY)
Admission: AD | Admit: 2018-11-17 | Discharge: 2018-11-17 | Disposition: A | Discharge status: Home / Self Care | Payer: Medicaid Other | Attending: Obstetrics & Gynecology | Admitting: Obstetrics & Gynecology

## 2018-11-17 ENCOUNTER — Other Ambulatory Visit: Payer: Self-pay

## 2018-11-17 DIAGNOSIS — O009 Unspecified ectopic pregnancy without intrauterine pregnancy: Secondary | ICD-10-CM | POA: Diagnosis not present

## 2018-11-17 DIAGNOSIS — O26891 Other specified pregnancy related conditions, first trimester: Secondary | ICD-10-CM | POA: Insufficient documentation

## 2018-11-17 DIAGNOSIS — R109 Unspecified abdominal pain: Secondary | ICD-10-CM | POA: Diagnosis not present

## 2018-11-17 DIAGNOSIS — O0281 Inappropriate change in quantitative human chorionic gonadotropin (hCG) in early pregnancy: Secondary | ICD-10-CM | POA: Insufficient documentation

## 2018-11-17 DIAGNOSIS — Z3A01 Less than 8 weeks gestation of pregnancy: Secondary | ICD-10-CM | POA: Diagnosis not present

## 2018-11-17 DIAGNOSIS — R252 Cramp and spasm: Secondary | ICD-10-CM | POA: Diagnosis not present

## 2018-11-17 LAB — COMPREHENSIVE METABOLIC PANEL
ALT: 17 U/L (ref 0–44)
AST: 25 U/L (ref 15–41)
Albumin: 4 g/dL (ref 3.5–5.0)
Alkaline Phosphatase: 73 U/L (ref 38–126)
Anion gap: 10 (ref 5–15)
BUN: 8 mg/dL (ref 6–20)
CO2: 26 mmol/L (ref 22–32)
Calcium: 9.3 mg/dL (ref 8.9–10.3)
Chloride: 104 mmol/L (ref 98–111)
Creatinine, Ser: 0.68 mg/dL (ref 0.44–1.00)
GFR calc Af Amer: >60 mL/min (ref >60)
GFR calc non Af Amer: >60 mL/min (ref >60)
Glucose, Bld: 99 mg/dL (ref 70–99)
Potassium: 3.6 mmol/L (ref 3.5–5.1)
Sodium: 140 mmol/L (ref 135–145)
Total Bilirubin: 0.7 mg/dL (ref 0.3–1.2)
Total Protein: 6.7 g/dL (ref 6.5–8.1)

## 2018-11-17 LAB — CBC WITH DIFFERENTIAL/PLATELET
Abs Immature Granulocytes: 0.04 10*3/uL (ref 0.00–0.07)
Basophils Absolute: 0.1 10*3/uL (ref 0.0–0.1)
Basophils Relative: 1 %
Eosinophils Absolute: 0.1 10*3/uL (ref 0.0–0.5)
Eosinophils Relative: 1 %
HCT: 39.8 % (ref 36.0–46.0)
Hemoglobin: 13.3 g/dL (ref 12.0–15.0)
Immature Granulocytes: 0 %
Lymphocytes Relative: 43 %
Lymphs Abs: 4 10*3/uL (ref 0.7–4.0)
MCH: 28.9 pg (ref 26.0–34.0)
MCHC: 33.4 g/dL (ref 30.0–36.0)
MCV: 86.3 fL (ref 80.0–100.0)
Monocytes Absolute: 0.5 10*3/uL (ref 0.1–1.0)
Monocytes Relative: 5 %
Neutro Abs: 4.5 10*3/uL (ref 1.7–7.7)
Neutrophils Relative %: 50 %
Platelets: 263 10*3/uL (ref 150–400)
RBC: 4.61 MIL/uL (ref 3.87–5.11)
RDW: 13.1 % (ref 11.5–15.5)
WBC: 9.2 10*3/uL (ref 4.0–10.5)
nRBC: 0 % (ref 0.0–0.2)

## 2018-11-17 LAB — HCG, QUANTITATIVE, PREGNANCY: hCG, Beta Chain, Quant, S: 193 m[IU]/mL — ABNORMAL HIGH (ref <5)

## 2018-11-17 MED ORDER — METHOTREXATE FOR ECTOPIC PREGNANCY
50.0000 mg/m2 | Freq: Once | INTRAMUSCULAR | Status: AC
  Administered 2018-11-17: 90 mg via INTRAMUSCULAR
  Filled 2018-11-17: qty 1

## 2018-11-17 NOTE — Discharge Instructions (Signed)
Methotrexate Treatment for an Ectopic Pregnancy, Care After °This sheet gives you information about how to care for yourself after your procedure. Your health care provider may also give you more specific instructions. If you have problems or questions, contact your health care provider. °What can I expect after the procedure? °After the procedure, it is common to have: °· Abdominal cramping. °· Vaginal bleeding. °· Fatigue. °· Nausea. °· Vomiting. °· Diarrhea. °Blood tests will be taken at timed intervals for several days or weeks to check your pregnancy hormone levels. The blood tests will be done until the pregnancy hormone can no longer be detected in the blood. °Follow these instructions at home: °Activity °· Do not have sex until your health care provider approves. °· Limit activities that take a lot of effort as told by your health care provider. °Medicines °· Take over the counter and prescription medicines only as told by your health care provider. °· Do not take aspirin, ibuprofen, naproxen, or any other NSAIDs. °· Do not take folic acid, prenatal vitamins, or other vitamins that contain folic acid. °General instructions ° °· Do not drink alcohol. °· Follow instructions from your health care provider on how and when to report any symptoms that may indicate a ruptured ectopic pregnancy. °· Keep all follow-up visits as told by your health care provider. This is important. °Contact a health care provider if: °· You have persistent nausea and vomiting. °· You have persistent diarrhea. °· You are having a reaction to the medicine, such as: °? Tiredness. °? Skin rash. °? Hair loss. °Get help right away if: °· Your abdominal or pelvic pain gets worse. °· You have more vaginal bleeding. °· You feel light-headed or you faint. °· You have shortness of breath. °· Your heart rate increases. °· You develop a cough. °· You have chills. °· You have a fever. °Summary °· After the procedure, it is common to have symptoms  of abdominal cramping, vaginal bleeding and fatigue. You may also experience other symptoms. °· Blood tests will be taken at timed intervals for several days or weeks to check your pregnancy hormone levels. The blood tests will be done until the pregnancy hormone can no longer be detected in the blood. °· Limit strenuous activity as told by your health care provider. °· Follow instructions from your health care provider on how and when to report any symptoms that may indicate a ruptured ectopic pregnancy. °This information is not intended to replace advice given to you by your health care provider. Make sure you discuss any questions you have with your health care provider. °Document Released: 07/16/2011 Document Revised: 09/15/2016 Document Reviewed: 09/15/2016 °Elsevier Interactive Patient Education © 2019 [employer] ° °

## 2018-11-17 NOTE — MAU Note (Signed)
Post MTX, has had cramping some pressure, and no nausea, has had NO bleeding since injection.

## 2018-11-17 NOTE — MAU Provider Note (Signed)
[name]  is a 38 y.o. G2P1001  at [redacted]w[redacted]d who presents to MAU today for follow-up quant hCG. This is her day 7 lab s/p MTX for ectopic pregnancy. Reports continued mild abdominal cramping & fullness. Denies vaginal bleeding.  Lower abdominal cramping and heaviness. Rates pain 4/10. Pain started a week ago & is intermittent. Nothing makes better or worse.   BP 123/86 (BP Location: Right Arm)   Pulse 83   Temp 98.6 F (37 C) (Oral)   Resp 16   LMP 09/25/2018   SpO2 100%   GENERAL: Well-developed, well-nourished female in no acute distress.  HEENT: Normocephalic, atraumatic.   LUNGS: Effort normal HEART: Regular rate  SKIN: Warm, dry and without erythema PSYCH: Normal mood and affect   A: 1. Ectopic pregnancy without intrauterine pregnancy, unspecified location   2. Inappropriate change in quantitative hCG in early pregnancy   -HCG with slight increase from day 4 to day 7 (180<193) -VSS & pt with minimal cramping. C/w Dr. Earlene Plater. Will give 2nd dose of MTX -rechecked CMP & CBC, reassuring -Discussed plan with patient, she is agreeable   P: Discharge home Ectopic precautions Pt to return to MAU on Sunday for her day 4 labs   Judeth Horn, NP  11/17/2018 6:02 PM

## 2018-11-20 ENCOUNTER — Other Ambulatory Visit: Payer: Self-pay

## 2018-11-20 ENCOUNTER — Inpatient Hospital Stay (HOSPITAL_COMMUNITY)
Admission: AD | Admit: 2018-11-20 | Discharge: 2018-11-20 | Disposition: A | Discharge status: Home / Self Care | Payer: Medicaid Other | Source: Ambulatory Visit | Attending: Obstetrics and Gynecology | Admitting: Obstetrics and Gynecology

## 2018-11-20 ENCOUNTER — Encounter (HOSPITAL_COMMUNITY): Payer: Self-pay | Admitting: Obstetrics and Gynecology

## 2018-11-20 DIAGNOSIS — R110 Nausea: Secondary | ICD-10-CM | POA: Diagnosis not present

## 2018-11-20 DIAGNOSIS — O009 Unspecified ectopic pregnancy without intrauterine pregnancy: Secondary | ICD-10-CM

## 2018-11-20 DIAGNOSIS — Z8759 Personal history of other complications of pregnancy, childbirth and the puerperium: Secondary | ICD-10-CM

## 2018-11-20 DIAGNOSIS — O26891 Other specified pregnancy related conditions, first trimester: Secondary | ICD-10-CM | POA: Insufficient documentation

## 2018-11-20 DIAGNOSIS — Z3A08 8 weeks gestation of pregnancy: Secondary | ICD-10-CM | POA: Diagnosis not present

## 2018-11-20 DIAGNOSIS — O00102 Left tubal pregnancy without intrauterine pregnancy: Secondary | ICD-10-CM | POA: Diagnosis not present

## 2018-11-20 HISTORY — DX: Personal history of other complications of pregnancy, childbirth and the puerperium: Z87.59

## 2018-11-20 LAB — HCG, QUANTITATIVE, PREGNANCY: hCG, Beta Chain, Quant, S: 153 m[IU]/mL — ABNORMAL HIGH (ref <5)

## 2018-11-20 NOTE — MAU Note (Signed)
Tanya Moore is a 38 y.o. at [redacted]w[redacted]d here in MAU reporting: here for follow up hcg after 2nd dose of MTX, no pain or bleeding today, reports some nausea  Pain score: 0/10  Vitals:   11/20/18 1131  BP: 108/81  Pulse: 73  Resp: 18  Temp: (!) 97.3 F (36.3 C)  SpO2: 100%      Lab orders placed from triage: hcg order released

## 2018-11-20 NOTE — Discharge Instructions (Signed)
Ectopic Pregnancy ° °An ectopic pregnancy is when the fertilized egg attaches (implants) outside the uterus. Most ectopic pregnancies occur in one of the tubes where eggs travel from the ovary to the uterus (fallopian tubes), but the implanting can occur in other locations. In rare cases, ectopic pregnancies occur on the ovary, intestine, pelvis, abdomen, or cervix. In an ectopic pregnancy, the fertilized egg does not have the ability to develop into a normal, healthy baby. °A ruptured ectopic pregnancy is one in which tearing or bursting of a fallopian tube causes internal bleeding. Often, there is intense lower abdominal pain, and vaginal bleeding sometimes occurs. Having an ectopic pregnancy can be life-threatening. If this dangerous condition is not treated, it can lead to blood loss, shock, or even death. °What are the causes? °The most common cause of this condition is damage to one of the fallopian tubes. A fallopian tube may be narrowed or blocked, and that keeps the fertilized egg from reaching the uterus. °What increases the risk? °This condition is more likely to develop in women of childbearing age who have different levels of risk. The levels of risk can be divided into three categories. °High risk °· You have gone through infertility treatment. °· You have had an ectopic pregnancy before. °· You have had surgery on the fallopian tubes, or another surgical procedure, such as an abortion. °· You have had surgery to have the fallopian tubes tied (tubal ligation). °· You have problems or diseases of the fallopian tubes. °· You have been exposed to diethylstilbestrol (DES). This medicine was used until 1971, and it had effects on babies whose mothers took the medicine. °· You become pregnant while using an IUD (intrauterine device) for birth control. °Moderate risk °· You have a history of infertility. °· You have had an STI (sexually transmitted infection). °· You have a history of pelvic inflammatory  disease (PID). °· You have scarring from endometriosis. °· You have multiple sexual partners. °· You smoke. °Low risk °· You have had pelvic surgery. °· You use vaginal douches. °· You became sexually active before age 18. °What are the signs or symptoms? °Common symptoms of this condition include normal pregnancy symptoms, such as missing a period, nausea, tiredness, abdominal pain, breast tenderness, and bleeding. However, ectopic pregnancy will have additional symptoms, such as: °· Pain with intercourse. °· Irregular vaginal bleeding or spotting. °· Cramping or pain on one side or in the lower abdomen. °· Fast heartbeat, low blood pressure, and sweating. °· Passing out while having a bowel movement. °Symptoms of a ruptured ectopic pregnancy and internal bleeding may include: °· Sudden, severe pain in the abdomen and pelvis. °· Dizziness, weakness, light-headedness, or fainting. °· Pain in the shoulder or neck area. °How is this diagnosed? °This condition is diagnosed by: °· A pelvic exam to locate pain or a mass in the abdomen. °· A pregnancy test. This blood test checks for the presence as well as the specific level of pregnancy hormone in the bloodstream. °· Ultrasound. This is performed if a pregnancy test is positive. In this test, a probe is inserted into the vagina. The probe will detect a fetus, possibly in a location other than the uterus. °· Taking a sample of uterus tissue (dilation and curettage, or D&C). °· Surgery to perform a visual exam of the inside of the abdomen using a thin, lighted tube that has a tiny camera on the end (laparoscope). °· Culdocentesis. This procedure involves inserting a needle at the top of   the vagina, behind the uterus. If blood is present in this area, it may indicate that a fallopian tube is torn. How is this treated? This condition is treated with medicine or surgery. Medicine  An injection of a medicine (methotrexate) may be given to cause the pregnancy tissue to be  absorbed. This medicine may save your fallopian tube. It may be given if: ? The diagnosis is made early, with no signs of active bleeding. ? The fallopian tube has not ruptured. ? You are considered to be a good candidate for the medicine. Usually, pregnancy hormone blood levels are checked after methotrexate treatment. This is to be sure that the medicine is effective. It may take 4-6 weeks for the pregnancy to be absorbed. Most pregnancies will be absorbed by 3 weeks. Surgery  A laparoscope may be used to remove the pregnancy tissue.  If severe internal bleeding occurs, a larger cut (incision) may be made in the lower abdomen (laparotomy) to remove the fetus and placenta. This is done to stop the bleeding.  Part or all of the fallopian tube may be removed (salpingectomy) along with the fetus and placenta. The fallopian tube may also be repaired during the surgery.  In very rare circumstances, removal of the uterus (hysterectomy) may be required.  After surgery, pregnancy hormone testing may be done to be sure that there is no pregnancy tissue left. Whether your treatment is medicine or surgery, you may receive a Rho (D) immune globulin shot to prevent problems with any future pregnancy. This shot may be given if:  You are Rh-negative and the baby's father is Rh-positive.  You are Rh-negative and you do not know the Rh type of the baby's father. Follow these instructions at home:  Rest and limit your activity after the procedure for as long as told by your health care provider.  Until your health care provider says that it is safe: ? Do not lift anything that is heavier than 10 lb (4.5 kg), or the limit that your health care provider tells you. ? Avoid physical exercise and any movement that requires effort (is strenuous).  To help prevent constipation: ? Eat a healthy diet that includes fruits, vegetables, and whole grains. ? Drink 6-8 glasses of water per day. Get help right away  if:  You develop worsening pain that is not relieved by medicine.  You have: ? A fever or chills. ? Vaginal bleeding. ? Redness and swelling at the incision site. ? Nausea and vomiting.  You feel dizzy or weak.  You feel light-headed or you faint. This information is not intended to replace advice given to you by your health care provider. Make sure you discuss any questions you have with your health care provider. Document Released: 09/03/2004 Document Revised: 03/25/2016 Document Reviewed: 02/26/2016 Elsevier Interactive Patient Education  2019 Elsevier Inc.   Methotrexate Treatment for an Ectopic Pregnancy  Methotrexate is a medicine that treats an ectopic pregnancy. An ectopic pregnancy is a pregnancy in which the fetus develops outside the uterus. This kind of pregnancy can be dangerous. Methotrexate works by stopping the growth of the fertilized egg. It also helps your body absorb tissue from the egg. This takes between 2-6 weeks. Most ectopic pregnancies can be successfully treated with methotrexate if they are diagnosed early. Tell a health care provider about:  Any allergies you have.  All medicines you are taking, including vitamins, herbs, eye drops, creams, and over-the-counter medicines.  Any medical conditions you have. What are the  risks? Generally, this is a safe treatment. However, problems may occur, including:  Nausea or vomiting or both.  Vaginal bleeding or spotting.  Diarrhea.  Abdominal cramping.  Dizziness or feeling lightheaded.  Mouth sores.  Swelling or irritation of the lining of your lungs (pneumonitis).  Liver damage.  Hair loss. There is a risk that methotrexate treatment will fail and your pregnancy will continue. There is also a risk that the ectopic pregnancy might rupture while you are using this medicine. What happens before the procedure?  Liver tests, kidney tests, and a complete blood test will be done.  Blood tests will be  done to measure the pregnancy hormone levels and to determine your blood type.  If you are Rh-negative and the father is Rh-positive or his Rh type is not known, you will be given a Rho (D) immune globulin shot. What happens during the procedure? Your health care provider may give you methotrexate by injection or in the form of a pill. Methotrexate may be given as a single dose of medicine or a series of doses, depending on your response to the treatment.  Methotrexate injections will be given by your health care provider. This is the most common way that methotrexate is used to treat an ectopic pregnancy.  If you are prescribed oral methotrexate, it is very important that you follow your health care provider's instructions on how to take oral methotrexate. Additional medicines may be needed to manage an ectopic pregnancy. The procedure may vary among health care providers and hospitals. What happens after the procedure?  You may have abdominal cramping, vaginal bleeding, and fatigue.  Blood tests will be taken at timed intervals for several days or weeks to check your pregnancy hormone levels. The blood tests will be done until the pregnancy hormone can no longer be detected in the blood.  You may need to have a surgical procedure to remove the ectopic pregnancy if methotrexate treatment fails.  Follow instructions from your health care provider on how and when to report any symptoms that may indicate a ruptured ectopic pregnancy. Summary  Methotrexate is a medicine that treats an ectopic pregnancy.  Methotrexate may be given in a single dose or a series of doses over time.  Blood tests will be taken at timed intervals for several days or weeks to check your pregnancy hormone levels. The blood tests will be done until no more pregnancy hormone is detected in the blood.  There is a risk that methotrexate treatment will fail and your pregnancy will continue. There is also a risk that the  ectopic pregnancy might rupture while you are using this medicine. This information is not intended to replace advice given to you by your health care provider. Make sure you discuss any questions you have with your health care provider. Document Released: 07/21/2001 Document Revised: 09/15/2016 Document Reviewed: 09/15/2016 Elsevier Interactive Patient Education  2019 Elsevier Inc.  **FOR INFORMATION ONLY**   Ruptured Ectopic Pregnancy  An ectopic pregnancy is when a fertilized egg attaches (implants) outside of the uterus, usually in a fallopian tube. A ruptured ectopic pregnancy is when the fallopian tube tears or bursts. This results in internal bleeding, intense abdominal pain, and sometimes, vaginal bleeding. Most ectopic pregnancies occur in the fallopian tube. In rare cases, it may occur on the ovary, intestine, pelvis, or cervix. An ectopic pregnancy does not have the ability to develop into a normal, healthy baby. A ruptured ectopic pregnancy can affect your ability to have children (fertility),  depending on damage it causes to your reproductive organs. Ruptured ectopic pregnancy is a medical emergency. If not treated immediately, it can lead to blood loss, shock, or even death. What are the causes? Most ectopic pregnancies are caused by damage to the fallopian tubes. The damage prevents the fertilized egg from implanting in the uterus. In some cases, the cause may not be known. What increases the risk? You are at increased risk for an ectopic pregnancy if:  You have had a previous ectopic pregnancy.  You have had previous fallopian tube surgery.  You have had previous surgery to have the fallopian tubes tied (tubal ligation).  You have had infertility treatments or have a history of infertility.  You have been exposed to DES. DES is a medicine that was used until 1971 and had effects on babies whose mothers took the medicine.  You use an IUD (intrauterine device) for birth  control.  You use progestin-only oral contraception for birth control.  You have a history of pelvic inflammatory disease (PID).  You have a history of endometriosis.  You smoke.  You became sexually active before 39 years of age.  You have multiple sexual partners. What are the signs or symptoms? Symptoms of a ruptured ectopic pregnancy and internal bleeding may include:  Sudden, severe pain in the abdomen and pelvis.  Dizziness or fainting.  Pain in the shoulder area.  Vaginal bleeding. How is this diagnosed? This condition is diagnosed based on your medical history, symptoms, a physical exam, and tests, which may include:  A pregnancy test.  An ultrasound.  Measuring the levels of the pregnancy hormone in the bloodstream.  Taking a sample of tissue from the uterus (dilation and curettage, D&C).  Surgery to visually examine the inside of the abdomen using a lighted tube (laparoscopy). How is this treated? This condition is treated with IV fluids and emergency surgery to remove the ectopic pregnancy and repair the area where the rupture occured. If you have lost a lot of blood, you may need a blood transfusion. If you are Rh negative and your baby's father is Rh positive, or the Rh type of the father is unknown, you may receive a Rho (D) immune globulin shot. This is to prevent Rh problems in future pregnancies. Additional medicines may be given. Get help right away if:  You are taking medicines to treat an ectopic pregnancy and you develop symptoms of a rupture. These include: ? Fever or chills. ? Shoulder pain. ? Vaginal bleeding. ? Nausea and vomiting. ? Severe abdominal pain or cramping. ? Feeling light-headed or fainting. Summary  An ectopic pregnancy is when a fertilized egg attaches (implants) outside of the uterus, usually in a fallopian tube. A ruptured ectopic pregnancy is when the fallopian tube tears or bursts.  Ruptured ectopic pregnancy is a medical  emergency. If not treated immediately, it can lead to blood loss, shock, or even death.  This condition is treated with IV fluids and emergency surgery to remove the ectopic pregnancy and repair the area where the rupture occured. If you have lost a lot of blood, you may need a blood transfusion. This information is not intended to replace advice given to you by your health care provider. Make sure you discuss any questions you have with your health care provider. Document Released: 07/24/2000 Document Revised: 10/14/2016 Document Reviewed: 10/14/2016 Elsevier Interactive Patient Education  2019 [employer].

## 2018-11-20 NOTE — MAU Provider Note (Signed)
History   Chief Complaint:  Follow-up   Tanya Moore is  38 y.o. G2P1001 Patient's last menstrual period was 09/25/2018.Marland Kitchen Patient is here for follow up of quantitative HCG for Day 4 post MTX injection #2.  She is [redacted]w[redacted]d weeks gestation.    Since her last visit, the patient is without new complaint.   The patient reports bleeding as  none now.  She denies pain.  General ROS:  positive for nausea  Her previous Quantitative HCG values are: (3/31) 131  ((4/3) 147  (4/6) 180  (4/9) 193  (4/12) 153   Physical Exam   Blood pressure 108/81, pulse 73, temperature (!) 97.3 F (36.3 C), temperature source Oral, resp. rate 18, height 5\' 4"  (1.626 m), weight 74.4 kg, last menstrual period 09/25/2018, SpO2 100 %.  Focused Gynecological Exam: normal external genitalia, vulva, vagina, cervix, uterus and adnexa, examination not indicated  Labs: Results for orders placed or performed during the hospital encounter of 11/20/18 (from the past 24 hour(s))  hCG, quantitative, pregnancy   Collection Time: 11/20/18 11:45 AM  Result Value Ref Range   hCG, Beta Chain, Quant, S 153 (H) <5 mIU/mL    Ultrasound Studies:   US Ob Less Than 14 Weeks With Ob Transvaginal  Result Date: 11/08/2018 CLINICAL DATA:  Vaginal bleeding in first trimester of pregnancy, spotting for 1.5 weeks; EGA [redacted] weeks 2 days by LMP of 09/25/2018; no quantitative beta HCG for correlation EXAM: OBSTETRIC <14 WK [place] AND TRANSVAGINAL OB US TECHNIQUE: Both transabdominal and transvaginal ultrasound examinations were performed for complete evaluation of the gestation as well as the maternal uterus, adnexal regions, and pelvic cul-de-sac. Transvaginal technique was performed to assess early pregnancy. COMPARISON:  None FINDINGS: Intrauterine gestational sac: None identified Yolk sac:  N/A Embryo:  N/A Cardiac Activity: N/A Heart Rate: N/A  bpm MSD:   mm    w     d CRL:    mm    w    d                  [place] EDC: Subchorionic hemorrhage:  N/A  Maternal uterus/adnexae: Uterus normal morphology without mass or fluid collection. No gestational sac identified. LEFT ovary suboptimally visualized due to position, 2.0 x 1.4 x 1.4 cm without focal mass. RIGHT ovary normal size and morphology 2.0 x 2.3 x 1.3 cm. No adnexal masses. Trace free pelvic fluid. IMPRESSION: No intrauterine gestation identified. Findings are compatible with pregnancy of unknown location. Differential diagnosis includes early intrauterine pregnancy too early to visualize, spontaneous abortion, and ectopic pregnancy. Serial quantitative beta hCG and or followup ultrasound recommended to definitively exclude ectopic pregnancy. Electronically Signed   By: Ulyses Southward M.D.   On: 11/08/2018 17:48    Assessment: [redacted]w[redacted]d weeks gestation   Ectopic Pregnancy S/P 2nd MTX (Day4)   Plan: The patient is instructed to have rpt HCG drawn in 3 days on Wednesday 11/23/2018 @ 0830 at [facility].  Raelyn Mora MSN, CNM 11/20/2018, 12:49 PM

## 2018-11-23 ENCOUNTER — Other Ambulatory Visit: Payer: Self-pay

## 2018-11-23 ENCOUNTER — Ambulatory Visit (INDEPENDENT_AMBULATORY_CARE_PROVIDER_SITE_OTHER): Payer: Medicaid Other

## 2018-11-23 DIAGNOSIS — O009 Unspecified ectopic pregnancy without intrauterine pregnancy: Secondary | ICD-10-CM

## 2018-11-23 LAB — BETA HCG QUANT (REF LAB): hCG Quant: 79 m[IU]/mL

## 2018-11-23 NOTE — Progress Notes (Signed)
Pt here today for STAT Beta Lab s/p second dose MTX tx day 7.  Pt denies any pain or bleeding at this time.  Pt stated that she needed to get back home because of child care issues.  I explained to the pt that she can leave however to make sure that she is by her phone for results and f/u in two hours.  Pt agreed.   Notified Dr. Vergie Living pt's beta results and sx's.  Provider recommendation is for pt to f/u beta level weekly until reach zero.  Notified pt provider's recommendation.  Pt stated that she will be able to come in for a lab draw on 11/30/18 @ 0800.  Front office notified.   Lambert Keto., RN

## 2018-11-28 ENCOUNTER — Telehealth: Payer: Self-pay | Admitting: *Deleted

## 2018-11-28 NOTE — Progress Notes (Signed)
I have reviewed the chart and agree with nursing staff's documentation of this patient's encounter.  [place], MD 11/28/2018 12:33 PM    

## 2018-11-28 NOTE — Telephone Encounter (Signed)
-----   Message from Oakley Bing, MD sent at 11/28/2018 12:23 PM EDT ----- hcg went down great. Please have her come in for another beta hcg (non stat) on 4/22. thanks

## 2018-11-28 NOTE — Telephone Encounter (Signed)
Attempted to call pt regarding bhcg level going down and the need for a follow up lab on 4/22.  Pt did not pick up.  Left message requesting pt contact the office.  Will send message to front office to schedule appointment.

## 2018-11-29 NOTE — Telephone Encounter (Signed)
Called pt to inform her that her bhcg level had decreased and that the provider requested a follow up lab on 4/22.  Pt verbalized understanding and stated that she has scheduled that appointment for 0800 on 4/22.

## 2018-11-30 ENCOUNTER — Other Ambulatory Visit: Payer: Self-pay

## 2018-11-30 ENCOUNTER — Other Ambulatory Visit: Payer: Medicaid Other

## 2018-11-30 DIAGNOSIS — O3680X0 Pregnancy with inconclusive fetal viability, not applicable or unspecified: Secondary | ICD-10-CM

## 2018-11-30 NOTE — Progress Notes (Unsigned)
betas

## 2018-12-01 LAB — BETA HCG QUANT (REF LAB): hCG Quant: 41 m[IU]/mL

## 2018-12-05 ENCOUNTER — Telehealth (HOSPITAL_COMMUNITY): Payer: Self-pay | Admitting: Lactation Services

## 2018-12-05 DIAGNOSIS — O00102 Left tubal pregnancy without intrauterine pregnancy: Secondary | ICD-10-CM

## 2018-12-05 NOTE — Telephone Encounter (Signed)
Pt called and wanted to know the results of her HCG on 4/22.

## 2018-12-08 NOTE — Telephone Encounter (Signed)
Per Dr Marice Potter, patient needs weekly bhcg.   Called patient and informed her of results & need for weekly bhcg draws until 0. Patient verbalized understanding and states she can come 5/4 @ 815. Patient had no questions.

## 2018-12-12 ENCOUNTER — Other Ambulatory Visit: Payer: Medicaid Other

## 2018-12-12 ENCOUNTER — Other Ambulatory Visit: Payer: Self-pay | Admitting: *Deleted

## 2018-12-12 ENCOUNTER — Other Ambulatory Visit: Payer: Self-pay

## 2018-12-12 DIAGNOSIS — O009 Unspecified ectopic pregnancy without intrauterine pregnancy: Secondary | ICD-10-CM

## 2018-12-13 LAB — BETA HCG QUANT (REF LAB): hCG Quant: 16 m[IU]/mL

## 2019-02-14 ENCOUNTER — Ambulatory Visit (INDEPENDENT_AMBULATORY_CARE_PROVIDER_SITE_OTHER): Payer: Medicaid Other | Admitting: Family Medicine

## 2019-02-14 ENCOUNTER — Other Ambulatory Visit: Payer: Self-pay

## 2019-02-14 VITALS — BP 124/80 | HR 92

## 2019-02-14 DIAGNOSIS — L989 Disorder of the skin and subcutaneous tissue, unspecified: Secondary | ICD-10-CM

## 2019-02-14 DIAGNOSIS — Z0000 Encounter for general adult medical examination without abnormal findings: Secondary | ICD-10-CM

## 2019-02-14 DIAGNOSIS — E7800 Pure hypercholesterolemia, unspecified: Secondary | ICD-10-CM | POA: Diagnosis not present

## 2019-02-14 DIAGNOSIS — O00102 Left tubal pregnancy without intrauterine pregnancy: Secondary | ICD-10-CM

## 2019-02-14 NOTE — Patient Instructions (Signed)
It was a pleasure to see you today! Thank you for choosing [facility] for your primary care. Laycee Fitzsimmons [place] was seen for physical. Come back to the clinic on Monday for ID.   1. Thank you coming in today we will get your lab results to you when they come back.  Do not forget to come in on Monday to get your IUD with Dr. Enid Derry.  If you don't hear from [place] in two weeks, please give [place] a call to verify your results. Otherwise, we look forward to seeing you again at your next visit. If you have any questions or concerns before then, please call the clinic at [phone].   Please bring all your medications to every doctors visit   Sign up for My Chart to have easy access to your labs results, and communication with your Primary care physician.     Please check-out at the front desk before leaving the clinic.     Best,  Dr. Sherene Sires FAMILY MEDICINE RESIDENT - PGY2 02/14/2019 3:47 PM

## 2019-02-15 LAB — LIPID PANEL
Chol/HDL Ratio: 5.1 ratio — ABNORMAL HIGH (ref 0.0–4.4)
Cholesterol, Total: 234 mg/dL — ABNORMAL HIGH (ref 100–199)
HDL: 46 mg/dL (ref >39)
LDL Calculated: 163 mg/dL — ABNORMAL HIGH (ref 0–99)
Triglycerides: 126 mg/dL (ref 0–149)
VLDL Cholesterol Cal: 25 mg/dL (ref 5–40)

## 2019-02-15 LAB — BETA HCG QUANT (REF LAB): hCG Quant: <1 m[IU]/mL

## 2019-02-19 DIAGNOSIS — L989 Disorder of the skin and subcutaneous tissue, unspecified: Secondary | ICD-10-CM | POA: Insufficient documentation

## 2019-02-19 DIAGNOSIS — E7800 Pure hypercholesterolemia, unspecified: Secondary | ICD-10-CM | POA: Insufficient documentation

## 2019-02-19 NOTE — Progress Notes (Signed)
    Subjective:  Tanya Moore is a 38 y.o. female who presents to the [facility] today with a chief complaint of annual checkup.   HPI: Patient presents for annual blood work at her request.  No new symptoms to report beyond a slightly increasing lesion on left deltoid.  No abdominal pain or abnormal bleeding since ectopic treated with methotrexatex2.Marland Kitchen Patient has since restarted normal periods.  She is requesting a cholesterol panel but has no chest pain or other obvious risk factors.  She would like time to think about birth control and is considering a longer term solution (not tubal at this time) but is not ready to make the decision.  She would like to come back and have that discussion at another time (maybe mirena).  [facility] will [facility] to get [facility] (no problems reported)  Objective:  Physical Exam: BP 124/80   Pulse 92   LMP 09/25/2018   SpO2 99%   Gen: NAD, resting comfortably CV: RRR with no murmurs appreciated Pulm: NWOB, CTAB with no crackles, wheezes, or rhonchi GI: Normal bowel sounds present. Soft, Nontender, Nondistended. MSK: no edema, cyanosis, or clubbing noted Skin: warm, dry. Likely sebaceous cyst ~1cm in lower portion of left delt tattoo.  Difficult to determine discoloration given tattoo pattern (pic in chart) Neuro: grossly normal, moves all extremities Psych: Normal affect and thought content  No results found for this or any previous visit (from the past 72 hour(s)).   Assessment/Plan:  Hypercholesteremia Elevated cholesterol on lipid panel requested by patient, ASCVD 20yr risk calculated at 1.1%, no indicationi for starting statin at this time  Ectopic pregnancy No symptoms, hcg quant now negative (had been lost to obgyn followup recently)  Health care maintenance She [facility], will try to get documentation (no problems reported)   Sherene Sires, [place] - PGY2 02/20/2019 2:08 PM

## 2019-02-19 NOTE — Assessment & Plan Note (Signed)
No symptoms, hcg quant now negative (had been lost to obgyn followup recently)

## 2019-02-19 NOTE — Assessment & Plan Note (Signed)
Elevated cholesterol on lipid panel requested by patient, ASCVD 92yr risk calculated at 1.1%, no indicationi for starting statin at this time

## 2019-02-20 ENCOUNTER — Ambulatory Visit: Payer: Medicaid Other | Admitting: Family Medicine

## 2019-02-20 NOTE — Assessment & Plan Note (Addendum)
[facility], will try to get documentation (no problems reported)

## 2019-05-03 ENCOUNTER — Inpatient Hospital Stay (HOSPITAL_COMMUNITY): Payer: Medicaid Other | Admitting: Anesthesiology

## 2019-05-03 ENCOUNTER — Encounter (HOSPITAL_COMMUNITY)
Admission: AD | Disposition: A | Discharge status: Home / Self Care | Payer: Self-pay | Source: Home / Self Care | Attending: Obstetrics and Gynecology

## 2019-05-03 ENCOUNTER — Other Ambulatory Visit: Payer: Self-pay | Admitting: Nurse Practitioner

## 2019-05-03 ENCOUNTER — Inpatient Hospital Stay (HOSPITAL_COMMUNITY): Payer: Medicaid Other

## 2019-05-03 ENCOUNTER — Encounter (HOSPITAL_COMMUNITY): Payer: Self-pay | Admitting: *Deleted

## 2019-05-03 ENCOUNTER — Ambulatory Visit (HOSPITAL_COMMUNITY)
Admission: AD | Admit: 2019-05-03 | Discharge: 2019-05-03 | Disposition: A | Discharge status: Home / Self Care | Payer: Medicaid Other | Attending: Obstetrics and Gynecology | Admitting: Obstetrics and Gynecology

## 2019-05-03 ENCOUNTER — Other Ambulatory Visit: Payer: Self-pay

## 2019-05-03 DIAGNOSIS — Z8759 Personal history of other complications of pregnancy, childbirth and the puerperium: Secondary | ICD-10-CM

## 2019-05-03 DIAGNOSIS — K661 Hemoperitoneum: Secondary | ICD-10-CM | POA: Diagnosis present

## 2019-05-03 DIAGNOSIS — Z20828 Contact with and (suspected) exposure to other viral communicable diseases: Secondary | ICD-10-CM | POA: Diagnosis not present

## 2019-05-03 DIAGNOSIS — O26891 Other specified pregnancy related conditions, first trimester: Secondary | ICD-10-CM

## 2019-05-03 DIAGNOSIS — O0091 Unspecified ectopic pregnancy with intrauterine pregnancy: Secondary | ICD-10-CM | POA: Diagnosis present

## 2019-05-03 DIAGNOSIS — O00111 Right tubal pregnancy with intrauterine pregnancy: Secondary | ICD-10-CM | POA: Insufficient documentation

## 2019-05-03 DIAGNOSIS — Z87891 Personal history of nicotine dependence: Secondary | ICD-10-CM | POA: Insufficient documentation

## 2019-05-03 DIAGNOSIS — O00101 Right tubal pregnancy without intrauterine pregnancy: Secondary | ICD-10-CM | POA: Diagnosis present

## 2019-05-03 DIAGNOSIS — R109 Unspecified abdominal pain: Secondary | ICD-10-CM

## 2019-05-03 HISTORY — DX: Right tubal pregnancy without intrauterine pregnancy: O00.101

## 2019-05-03 HISTORY — DX: Unspecified ectopic pregnancy with intrauterine pregnancy: O00.91

## 2019-05-03 HISTORY — DX: Hemoperitoneum: K66.1

## 2019-05-03 HISTORY — PX: DIAGNOSTIC LAPAROSCOPY WITH REMOVAL OF ECTOPIC PREGNANCY: SHX6449

## 2019-05-03 LAB — TYPE AND SCREEN
ABO/RH(D): O POS
Antibody Screen: NEGATIVE

## 2019-05-03 LAB — CBC
HCT: 39.5 % (ref 36.0–46.0)
Hemoglobin: 13.2 g/dL (ref 12.0–15.0)
MCH: 30.1 pg (ref 26.0–34.0)
MCHC: 33.4 g/dL (ref 30.0–36.0)
MCV: 90.2 fL (ref 80.0–100.0)
Platelets: 324 10*3/uL (ref 150–400)
RBC: 4.38 MIL/uL (ref 3.87–5.11)
RDW: 12.9 % (ref 11.5–15.5)
WBC: 21.7 10*3/uL — ABNORMAL HIGH (ref 4.0–10.5)
nRBC: 0 % (ref 0.0–0.2)

## 2019-05-03 LAB — URINALYSIS, ROUTINE W REFLEX MICROSCOPIC
Bilirubin Urine: NEGATIVE
Glucose, UA: NEGATIVE mg/dL
Hgb urine dipstick: NEGATIVE
Ketones, ur: 5 mg/dL — AB
Leukocytes,Ua: NEGATIVE
Nitrite: NEGATIVE
Protein, ur: NEGATIVE mg/dL
Specific Gravity, Urine: 1.018 (ref 1.005–1.030)
pH: 6 (ref 5.0–8.0)

## 2019-05-03 LAB — WET PREP, GENITAL
Clue Cells Wet Prep HPF POC: NONE SEEN
Sperm: NONE SEEN
Trich, Wet Prep: NONE SEEN
Yeast Wet Prep HPF POC: NONE SEEN

## 2019-05-03 LAB — HCG, QUANTITATIVE, PREGNANCY: hCG, Beta Chain, Quant, S: 83815 m[IU]/mL — ABNORMAL HIGH (ref <5)

## 2019-05-03 LAB — POCT PREGNANCY, URINE: Preg Test, Ur: POSITIVE — AB

## 2019-05-03 LAB — SARS CORONAVIRUS 2 BY RT PCR (HOSPITAL ORDER, PERFORMED IN ~~LOC~~ HOSPITAL LAB): SARS Coronavirus 2: NEGATIVE

## 2019-05-03 SURGERY — LAPAROSCOPY, WITH ECTOPIC PREGNANCY SURGICAL TREATMENT
Anesthesia: General

## 2019-05-03 MED ORDER — SUCCINYLCHOLINE CHLORIDE 200 MG/10ML IV SOSY
PREFILLED_SYRINGE | INTRAVENOUS | Status: AC
  Filled 2019-05-03: qty 10

## 2019-05-03 MED ORDER — ROCURONIUM BROMIDE 10 MG/ML (PF) SYRINGE
PREFILLED_SYRINGE | INTRAVENOUS | Status: AC
  Filled 2019-05-03: qty 10

## 2019-05-03 MED ORDER — BUPIVACAINE HCL (PF) 0.5 % IJ SOLN
INTRAMUSCULAR | Status: AC
  Filled 2019-05-03: qty 30

## 2019-05-03 MED ORDER — ACETAMINOPHEN 500 MG PO TABS: 1000.0000 mg | ORAL_TABLET | Freq: Once | ORAL | Status: DC | PRN

## 2019-05-03 MED ORDER — ROCURONIUM BROMIDE 10 MG/ML (PF) SYRINGE
PREFILLED_SYRINGE | INTRAVENOUS | Status: DC | PRN
  Administered 2019-05-03: 40 mg via INTRAVENOUS

## 2019-05-03 MED ORDER — FENTANYL CITRATE (PF) 100 MCG/2ML IJ SOLN
25.0000 ug | INTRAMUSCULAR | Status: DC | PRN
  Administered 2019-05-03 (×2): 50 ug via INTRAVENOUS

## 2019-05-03 MED ORDER — FENTANYL CITRATE (PF) 250 MCG/5ML IJ SOLN
INTRAMUSCULAR | Status: DC | PRN
  Administered 2019-05-03: 100 ug via INTRAVENOUS
  Administered 2019-05-03 (×3): 50 ug via INTRAVENOUS

## 2019-05-03 MED ORDER — OXYCODONE HCL 5 MG/5ML PO SOLN: 5.0000 mg | Freq: Once | ORAL | Status: AC | PRN

## 2019-05-03 MED ORDER — FENTANYL CITRATE (PF) 100 MCG/2ML IJ SOLN
INTRAMUSCULAR | Status: AC
  Filled 2019-05-03: qty 2

## 2019-05-03 MED ORDER — LIDOCAINE 2% (20 MG/ML) 5 ML SYRINGE
INTRAMUSCULAR | Status: AC
  Filled 2019-05-03: qty 5

## 2019-05-03 MED ORDER — BUPIVACAINE HCL (PF) 0.5 % IJ SOLN
INTRAMUSCULAR | Status: DC | PRN
  Administered 2019-05-03: 11 mL

## 2019-05-03 MED ORDER — CEFAZOLIN SODIUM-DEXTROSE 2-3 GM-%(50ML) IV SOLR
INTRAVENOUS | Status: DC | PRN
  Administered 2019-05-03: 2 g via INTRAVENOUS

## 2019-05-03 MED ORDER — ONDANSETRON HCL 4 MG/2ML IJ SOLN
INTRAMUSCULAR | Status: AC
  Filled 2019-05-03: qty 2

## 2019-05-03 MED ORDER — LIDOCAINE 2% (20 MG/ML) 5 ML SYRINGE
INTRAMUSCULAR | Status: DC | PRN
  Administered 2019-05-03: 50 mg via INTRAVENOUS

## 2019-05-03 MED ORDER — FENTANYL CITRATE (PF) 250 MCG/5ML IJ SOLN
INTRAMUSCULAR | Status: AC
  Filled 2019-05-03: qty 5

## 2019-05-03 MED ORDER — PROPOFOL 10 MG/ML IV BOLUS
INTRAVENOUS | Status: AC
  Filled 2019-05-03: qty 20

## 2019-05-03 MED ORDER — FENTANYL CITRATE (PF) 100 MCG/2ML IJ SOLN
100.0000 ug | Freq: Once | INTRAMUSCULAR | Status: AC
  Administered 2019-05-03: 19:00:00 100 ug via INTRAVENOUS

## 2019-05-03 MED ORDER — OXYCODONE HCL 5 MG PO CAPS: 5.0000 mg | ORAL_CAPSULE | ORAL | 0 refills | Status: DC | PRN

## 2019-05-03 MED ORDER — ACETAMINOPHEN 160 MG/5ML PO SOLN: 1000.0000 mg | Freq: Once | ORAL | Status: DC | PRN

## 2019-05-03 MED ORDER — DEXAMETHASONE SODIUM PHOSPHATE 10 MG/ML IJ SOLN
INTRAMUSCULAR | Status: AC
  Filled 2019-05-03: qty 1

## 2019-05-03 MED ORDER — ACETAMINOPHEN 10 MG/ML IV SOLN
1000.0000 mg | Freq: Once | INTRAVENOUS | Status: DC | PRN
  Administered 2019-05-03: 1000 mg via INTRAVENOUS

## 2019-05-03 MED ORDER — LACTATED RINGERS IV SOLN
INTRAVENOUS | Status: DC
  Administered 2019-05-03: 18:00:00 via INTRAVENOUS

## 2019-05-03 MED ORDER — DEXAMETHASONE SODIUM PHOSPHATE 10 MG/ML IJ SOLN
INTRAMUSCULAR | Status: DC | PRN
  Administered 2019-05-03: 10 mg via INTRAVENOUS

## 2019-05-03 MED ORDER — OXYCODONE HCL 5 MG PO TABS
ORAL_TABLET | ORAL | Status: AC
  Filled 2019-05-03: qty 1

## 2019-05-03 MED ORDER — FENTANYL CITRATE (PF) 100 MCG/2ML IJ SOLN
INTRAMUSCULAR | Status: AC
  Administered 2019-05-03: 19:00:00 100 ug via INTRAVENOUS
  Filled 2019-05-03: qty 2

## 2019-05-03 MED ORDER — SODIUM CHLORIDE 0.9 % IR SOLN
Status: DC | PRN
  Administered 2019-05-03: 3000 mL

## 2019-05-03 MED ORDER — ACETAMINOPHEN 10 MG/ML IV SOLN
INTRAVENOUS | Status: AC
  Filled 2019-05-03: qty 100

## 2019-05-03 MED ORDER — DOCUSATE SODIUM 100 MG PO CAPS: 100.0000 mg | ORAL_CAPSULE | Freq: Two times a day (BID) | ORAL | 2 refills | Status: DC | PRN

## 2019-05-03 MED ORDER — OXYCODONE HCL 5 MG PO TABS
5.0000 mg | ORAL_TABLET | Freq: Once | ORAL | Status: AC | PRN
  Administered 2019-05-03: 21:00:00 5 mg via ORAL

## 2019-05-03 MED ORDER — LACTATED RINGERS IV SOLN
INTRAVENOUS | Status: DC
  Administered 2019-05-03 (×2): via INTRAVENOUS

## 2019-05-03 MED ORDER — PROPOFOL 10 MG/ML IV BOLUS
INTRAVENOUS | Status: DC | PRN
  Administered 2019-05-03: 160 mg via INTRAVENOUS

## 2019-05-03 MED ORDER — SUCCINYLCHOLINE CHLORIDE 200 MG/10ML IV SOSY
PREFILLED_SYRINGE | INTRAVENOUS | Status: DC | PRN
  Administered 2019-05-03: 80 mg via INTRAVENOUS

## 2019-05-03 MED ORDER — ONDANSETRON HCL 4 MG/2ML IJ SOLN
INTRAMUSCULAR | Status: DC | PRN
  Administered 2019-05-03: 4 mg via INTRAVENOUS

## 2019-05-03 MED ORDER — SUGAMMADEX SODIUM 200 MG/2ML IV SOLN
INTRAVENOUS | Status: DC | PRN
  Administered 2019-05-03: 200 mg via INTRAVENOUS

## 2019-05-03 SURGICAL SUPPLY — 30 items
ADH SKN CLS APL DERMABOND .7 (GAUZE/BANDAGES/DRESSINGS) ×1
BAG SPEC RTRVL LRG 6X4 10 (ENDOMECHANICALS) ×1
CABLE HIGH FREQUENCY MONO STRZ (ELECTRODE) IMPLANT
CATH ROBINSON RED A/P 16FR (CATHETERS) ×2 IMPLANT
DERMABOND ADVANCED (GAUZE/BANDAGES/DRESSINGS) ×1
DERMABOND ADVANCED .7 DNX12 (GAUZE/BANDAGES/DRESSINGS) ×1 IMPLANT
DURAPREP 26ML APPLICATOR (WOUND CARE) ×2 IMPLANT
GLOVE BIO SURGEON STRL SZ 6.5 (GLOVE) ×2 IMPLANT
GLOVE BIOGEL PI IND STRL 7.0 (GLOVE) ×3 IMPLANT
GLOVE BIOGEL PI INDICATOR 7.0 (GLOVE) ×3
GLOVE ECLIPSE 7.0 STRL STRAW (GLOVE) ×2 IMPLANT
GOWN STRL REUS W/ TWL LRG LVL3 (GOWN DISPOSABLE) ×2 IMPLANT
GOWN STRL REUS W/TWL LRG LVL3 (GOWN DISPOSABLE) ×4
KIT TURNOVER KIT B (KITS) ×2 IMPLANT
NS IRRIG 1000ML POUR BTL (IV SOLUTION) ×2 IMPLANT
PACK LAPAROSCOPY BASIN (CUSTOM PROCEDURE TRAY) ×2 IMPLANT
PACK TRENDGUARD 450 HYBRID PRO (MISCELLANEOUS) IMPLANT
POUCH SPECIMEN RETRIEVAL 10MM (ENDOMECHANICALS) ×1 IMPLANT
PROTECTOR NERVE ULNAR (MISCELLANEOUS) ×4 IMPLANT
SET IRRIG TUBING LAPAROSCOPIC (IRRIGATION / IRRIGATOR) ×2 IMPLANT
SET TUBE SMOKE EVAC HIGH FLOW (TUBING) ×2 IMPLANT
SHEARS HARMONIC ACE PLUS 36CM (ENDOMECHANICALS) ×1 IMPLANT
SLEEVE ENDOPATH XCEL 5M (ENDOMECHANICALS) ×2 IMPLANT
SUT MNCRL AB 4-0 PS2 18 (SUTURE) ×2 IMPLANT
SUT VICRYL 0 UR6 27IN ABS (SUTURE) IMPLANT
TOWEL GREEN STERILE FF (TOWEL DISPOSABLE) ×4 IMPLANT
TRAY FOLEY W/BAG SLVR 14FR (SET/KITS/TRAYS/PACK) ×1 IMPLANT
TRENDGUARD 450 HYBRID PRO PACK (MISCELLANEOUS) ×2
TROCAR XCEL NON-BLD 11X100MML (ENDOMECHANICALS) ×1 IMPLANT
TROCAR XCEL NON-BLD 5MMX100MML (ENDOMECHANICALS) ×2 IMPLANT

## 2019-05-03 NOTE — Op Note (Signed)
Tiombe D Moore PROCEDURE DATE: 05/03/2019  PREOPERATIVE DIAGNOSIS: Possible ruptured right fallopian tube ectopic pregnancy with intrauterine pregnancy POSTOPERATIVE DIAGNOSIS: Ruptured right fallopian tube ectopic pregnancy with intrauterine pregnancy PROCEDURE: Laparoscopic right salpingectomy and removal of ectopic pregnancy SURGEON:  Dr. Verita Schneiders ANESTHESIOLOGY TEAM: Anesthesiologist: Oleta Mouse, MD CRNA: Teressa Lower., CRNA  INDICATIONS: 38 y.o. [phone] at [redacted]w[redacted]d here with the preoperative diagnoses as listed above.  Please refer to preoperative notes for more details. Patient was counseled regarding need for laparoscopic salpingectomy. Risks of surgery including bleeding which may require transfusion or reoperation, infection, injury to bowel or other surrounding organs, need for additional procedures including laparotomy and other postoperative/anesthesia complications were explained to patient.  Written informed consent was obtained.  FINDINGS:  Moderate amount of hemoperitoneum estimated to be about 300 ml of blood and clots.  Dilated right fallopian tube containing ectopic gestation. Small normal appearing uterus, normal left fallopian tube, right ovary and left ovary.  ANESTHESIA: General ESTIMATED BLOOD LOSS: 300 ml of hemoperitoneum, less than 10 ml of operative blood loss SPECIMENS: Right fallopian tube containing ectopic gestation COMPLICATIONS: None immediate  PROCEDURE IN DETAIL:  The patient was taken to the operating room where general anesthesia was administered and was found to be adequate.  She was placed in the dorsal lithotomy position, and was prepped and draped in a sterile manner.  A Foley catheter was inserted into her bladder and attached to constant drainage.    After an adequate timeout was performed, attention was turned to the abdomen where an umbilical incision was made with the scalpel.  The Optiview 11-mm trocar and sleeve were then  advanced without difficulty with the laparoscope under direct visualization into the abdomen.  The abdomen was then insufflated with carbon dioxide gas and adequate pneumoperitoneum was obtained.  A survey of the patient's pelvis and abdomen revealed the findings above.  Two 5-mm left lower quadrant ports were then placed under direct visualization.  The Nezhat suction irrigator was then used to suction the hemoperitoneum and irrigate the pelvis.  Attention was then turned to the right fallopian tube which was grasped and ligated from the underlying mesosalpinx and uterine attachment using the Harmonic instrument.  Good hemostasis was noted.  The specimen was placed in an EndoCatch bag and removed from the abdomen intact.  The abdomen was desufflated, and all instruments were removed.  The fascial incision of the 11-mm site was reapproximated with a 0 Vicryl figure-of-eight stitch; and all skin incisions were closed with 4-0 Monocryl and Dermabond. The patient tolerated the procedure well.  Sponge, lap, and needle counts were correct times three.  The patient was then taken to the recovery room awake, extubated and in stable condition.   The patient will be discharged to home as per PACU criteria.  Routine postoperative instructions given.  She was prescribed Percocet and Colace.  She will follow up in the clinic in about 2-3 weeks for postoperative evaluation and for her initial prenatal visit.   Verita Schneiders, MD, [place] for [employer], [place]

## 2019-05-03 NOTE — Op Note (Deleted)
Consent for procedure signed and witnessed. Dr. Harolyn Rutherford to the bedside to explain procedure to patient. Risks/beneifts discussed and all questions answered. IV started. T/S sent. COVID swab sent.

## 2019-05-03 NOTE — Progress Notes (Signed)
Consent for procedure signed and witnessed. Dr. Anyanwu to the bedside to explain procedure to patient. Risks/beneifts discussed and all questions answered. IV started. T/S sent. COVID swab sent.  

## 2019-05-03 NOTE — MAU Provider Note (Addendum)
History     CSN: [id number]  Arrival date and time: 05/03/19 1317   First Provider Initiated Contact with Patient 05/03/19 1616      Chief Complaint  Patient presents with  . Abdominal Pain  . Possible Pregnancy   HPI Tanya Moore 38 y.o. 88w2dComes to MAU today with severe RLQ pain which started this morning.  Has vomited twice today.  Pain has become more severe than her last labor and birth.  History of ectopic pregnancy treated with 2 doses of methotrexate.  Is worried that this is an ectopic pregnancy.  Had normal BM this morning x 2.  OB History    Gravida  4   Para  1   Term  1   Preterm      AB  1   Living  1     SAB      TAB  1   Ectopic      Multiple      Live Births              Past Medical History:  Diagnosis Date  . Allergy   . Anxiety   . Bipolar 1 disorder ([place]   . Depression   . Hyperlipidemia     Past Surgical History:  Procedure Laterality Date  . APPENDECTOMY    . BUNIONECTOMY    . CESAREAN SECTION  2017  . TONSILLECTOMY      Family History  Problem Relation Age of Onset  . Hypertension Mother   . Hypertension Father   . Cancer Father        prostate  . Depression Sister     Social History   Tobacco Use  . Smoking status: Former Smoker    Quit date: 2017    Years since quitting: 3.7  . Smokeless tobacco: Never Used  Substance Use Topics  . Alcohol use: No  . Drug use: No    Allergies:  Allergies  Allergen Reactions  . Lamictal [Lamotrigine] Rash    Patient reported    No medications prior to admission.    Review of Systems  Constitutional: Negative for fever.  Respiratory: Negative for cough and shortness of breath.   Gastrointestinal: Positive for abdominal pain, nausea and vomiting.  Genitourinary: Negative for dysuria, vaginal bleeding and vaginal discharge.   Physical Exam   Blood pressure 110/84, pulse 84, temperature 98.1 F (36.7 C), temperature source Oral, resp. rate 18, height 5'  4" (1.626 m), weight 69.9 kg, last menstrual period 03/13/2019, SpO2 100 %, unknown if currently breastfeeding.  Physical Exam  Nursing note and vitals reviewed. Constitutional: She is oriented to person, place, and time. She appears well-developed and well-nourished.  HENT:  Head: Normocephalic.  Eyes: EOM are normal.  Neck: Neck supple.  GI: Soft. Bowel sounds are normal. There is abdominal tenderness. There is guarding. There is no rebound.  RLQ tenderness.  Also has tenderness in RLQ area when LLQ is palpated.  Very tender to the most gentle exam.  Genitourinary:    Genitourinary Comments: Speculum exam: Vagina - Mod amount of watery blood, some pooling Cervix - No contact bleeding Bimanual exam: Cervix closed Uterus non tender, normal size Adnexa RLQ exquisitely tender and client tearful with the most gentle exam GC/Chlam, wet prep done Chaperone present for exam.    Musculoskeletal: Normal range of motion.  Neurological: She is alert and oriented to person, place, and time.  Skin: Skin is warm and dry.  Psychiatric: She  has a normal mood and affect.    MAU Course  Procedures Results for orders placed or performed during the hospital encounter of 05/03/19 (from the past 24 hour(s))  Pregnancy, urine POC     Status: Abnormal   Collection Time: 05/03/19  2:05 PM  Result Value Ref Range   Preg Test, Ur POSITIVE (A) NEGATIVE  Urinalysis, Routine w reflex microscopic     Status: Abnormal   Collection Time: 05/03/19  2:33 PM  Result Value Ref Range   Color, Urine YELLOW YELLOW   APPearance HAZY (A) CLEAR   Specific Gravity, Urine 1.018 1.005 - 1.030   pH 6.0 5.0 - 8.0   Glucose, UA NEGATIVE NEGATIVE mg/dL   Hgb urine dipstick NEGATIVE NEGATIVE   Bilirubin Urine NEGATIVE NEGATIVE   Ketones, ur 5 (A) NEGATIVE mg/dL   Protein, ur NEGATIVE NEGATIVE mg/dL   Nitrite NEGATIVE NEGATIVE   Leukocytes,Ua NEGATIVE NEGATIVE  CBC     Status: Abnormal   Collection Time: 05/03/19   3:06 PM  Result Value Ref Range   WBC 21.7 (H) 4.0 - 10.5 K/uL   RBC 4.38 3.87 - 5.11 MIL/uL   Hemoglobin 13.2 12.0 - 15.0 g/dL   HCT 39.5 36.0 - 46.0 %   MCV 90.2 80.0 - 100.0 fL   MCH 30.1 26.0 - 34.0 pg   MCHC 33.4 30.0 - 36.0 g/dL   RDW 12.9 11.5 - 15.5 %   Platelets 324 150 - 400 K/uL   nRBC 0.0 0.0 - 0.2 %  hCG, quantitative, pregnancy     Status: Abnormal   Collection Time: 05/03/19  3:06 PM  Result Value Ref Range   hCG, Beta Chain, Quant, S 83,815 (H) <5 mIU/mL  Wet prep, genital     Status: Abnormal   Collection Time: 05/03/19  4:30 PM   Specimen: PATH Cytology Cervicovaginal Ancillary Only  Result Value Ref Range   Yeast Wet Prep HPF POC NONE SEEN NONE SEEN   Trich, Wet Prep NONE SEEN NONE SEEN   Clue Cells Wet Prep HPF POC NONE SEEN NONE SEEN   WBC, Wet Prep HPF POC MANY (A) NONE SEEN   Sperm NONE SEEN   Type and screen [place]     Status: None   Collection Time: 05/03/19  5:40 PM  Result Value Ref Range   ABO/RH(D) O POS    Antibody Screen NEG    Sample Expiration      05/06/2019,2359 Performed at [facility], [address], [place], [place] [address]    CLINICAL DATA:  Abdominal pain in pregnancy.  EXAM: OBSTETRIC <14 WK [place] AND TRANSVAGINAL OB US  TECHNIQUE: Both transabdominal and transvaginal ultrasound examinations were performed for complete evaluation of the gestation as well as the maternal uterus, adnexal regions, and pelvic cul-de-sac. Transvaginal technique was performed to assess early pregnancy.  COMPARISON:  None.  FINDINGS: Intrauterine gestational sac: Single  Yolk sac:  Visualized.  Embryo:  Visualized.  Cardiac Activity: Visualized.  Heart Rate: 138 bpm  CRL:  12.7 mm   7 w   3 d                  [place] EDC: 12/17/2019  Maternal uterus/adnexae:  Subchorionic hemorrhage: None  Right ovary: Not visualized  Left ovary: Not visualized  Other :There is a complex, heterogeneous mass  with internal vascularity measuring 7.0 x 4.3 x 4.3 cm. A normal right ovary is not visualized.  Free fluid:  Small volume of free fluid noted within the pelvis.  IMPRESSION: 1. Single living intrauterine gestation with an estimated gestational age of [redacted] weeks and 3 days. 2. Large complex heterogeneous mass with internal vascularity is identified within the right ovary. Heterotopic pregnancy with possible rupture cannot be excluded. 3. Small volume of free fluid noted within the pelvis.  Critical Value/emergent results were called by telephone at the time of interpretation on 05/03/2019 at 5:18 pm to providerterri  , who verbally acknowledged these results.   MDM Exam very suspicious for ectopic pregnancy.  Client very tender.  Bleeding noted on exam but had not been noticed by the client before. Last intake today was around 11 am - cider and bacon.  Nothing to eat or drink after than. Radiology called about the ultrasound. Dr. Harolyn Rutherford notified.  Covid test done.  IVF started. Blood type O positive  Assessment and Plan  Possible heterotopic pregnancy - IUP with FHT and ruptured Right ectopic pregnancy Plan Surgery  Virginia Rochester, NP 05/03/2019, 4:31 PM   Attestation of Attending Supervision of Advanced Practice Provider (PA/CNM/NP): Evaluation and management procedures were performed by the Advanced Practice Provider under my supervision and collaboration.  I have reviewed the Advanced Practice Provider's note and chart, and I agree with the management and plan.  Reviewed images and report with radiologist; the differential diagnoses include ruptured ectopic (heterotopic) pregnancy or ruptured hemorrhagic cyst.  Images do not help in differentiating these.  Patient met and examined, she has significant pain in RLQ with some guarding noted.  Reviewed findings of ultrasound with patient, explained unclear diagnosis but need for diagnostic laparoscopy given her symptoms  and her history. Patient agrees with this plan.  If ectopic pregnancy is seen, will proceed with salpingectomy and removal of ectopic pregnancy.  Risks of surgery including bleeding which may require transfusion or reoperation, infection, injury to bowel or other surrounding organs, need for additional procedures including laparotomy were explained to patient and written informed consent was obtained.  Patient has been NPO since 1100 and she will remain NPO for procedure. Anesthesiology (Dr. Laurie Panda) and OR aware.  Preoperative prophylactic antibiotics and SCDs ordered on call to the OR.  To OR when ready.   Verita Schneiders, MD, [place] for [employer], [place]

## 2019-05-03 NOTE — Anesthesia Procedure Notes (Signed)
Procedure Name: Intubation Date/Time: 05/03/2019 7:55 PM Performed by: Teressa Lower., CRNA Pre-anesthesia Checklist: Patient identified, Emergency Drugs available, Suction available and Patient being monitored Patient Re-evaluated:Patient Re-evaluated prior to induction Oxygen Delivery Method: Circle system utilized Preoxygenation: Pre-oxygenation with 100% oxygen Induction Type: IV induction, Rapid sequence and Cricoid Pressure applied Laryngoscope Size: Miller and 2 Grade View: Grade I Tube type: Oral Tube size: 7.0 mm Number of attempts: 1 Airway Equipment and Method: Stylet and Oral airway Placement Confirmation: ETT inserted through vocal cords under direct vision,  positive ETCO2 and breath sounds checked- equal and bilateral Secured at: 21 cm Tube secured with: Tape Dental Injury: Teeth and Oropharynx as per pre-operative assessment

## 2019-05-03 NOTE — Discharge Instructions (Signed)
Laparoscopic Surgery - Care After Laparoscopy is a surgical procedure. It is used to diagnose and treat diseases inside the belly(abdomen). It is usually a brief, common, and relatively simple procedure. The laparoscopeis a thin, lighted, pencil-sized instrument. It is like a telescope. It is inserted into your abdomen through a small cut (incision). Your caregiver can look at the organs inside your body through this instrument.  She can see if there is anything abnormal. Laparoscopy can be done either in a hospital or outpatient clinic. You may be given a mild sedative to help you relax before the procedure. Once in the operating room, you will be given a drug to make you sleep (general anesthesia). Laparoscopy usually lasts about 1 hour. After the procedure, you will be monitored in a recovery area until you are stable and doing well. Once you are home, it may take 3 to 7 days to fully recover.  Laparoscopy has relatively few risks. Your caregiver will discuss the risks with you before the procedure. Some problems that can occur include: RISKS AND COMPLICATIONS  Allergies to medicines. Difficulty breathing. Bleeding. Infection. Damage to other surrounding structures HOME CARE INSTRUCTIONS  Infection. Bleeding. Damage to other organs. Anesthetic side effects.  Need for additional procedures such as open procedures/laparotomy PROCEDURE Once you receive anesthesia, your surgeon inflates the abdomen with a harmless gas (carbon dioxide). This makes the organs easier to see. The laparoscope is inserted into the abdomen through a small incision. This allows your surgeon to see into the abdomen. Other small instruments are also inserted into the abdomen through other small openings. Many surgeons attach a video camera to the laparoscope to enlarge the view. During a laparoscopy, the surgeon may be looking for inflammation, infection, or cancer.  The surgeon may also need to take out certain organs or  take tissue samples (biopsies). The specimens are sent to a specialist in looking at cells and tissue samples (pathologist). The pathologist examines them under a microscope to help to diagnose or confirm a disease. AFTER THE PROCEDURE  The incisions are closed with stitches (sutures) and Dermabond. Because these incisions are small (usually less than 1/2 inch), there is usually minimal discomfort after the procedure. There may also be discomfort from the instrument placement incisions in the abdomen. You will be given pain medicine to ease any discomfort. You will rest in a recovery room for 1-2 hours until you are stable and doing well. You may have some mild discomfort in the throat. This is from the tube placed in your throat while you were sleeping. You may experience discomfort in the shoulder area from some trapped air between the liver and diaphragm. This sensation is normal and will slowly go away on its own. The recovery time is shortened as long as there are no complications. You will rest in a recovery room until stable and doing well. As long as there are no complications, you may be allowed to go home. Someone will need to drive you home and be with you for at least 24 hours once home. FINDING OUT THE RESULTS You will be called with the results of the pathology and will discuss these results with  your caregiver during your postoperative appointment. Do not assume everything is normal if you have not heard from your caregiver or the medical facility. It is important for you to follow up on all of your results. HOME CARE INSTRUCTIONS  Take all medicines as directed. Only take over-the-counter or prescription medicines for pain, discomfort,   or fever as directed by your caregiver. Resume daily activities as directed. Showers are preferred over baths. You may resume sexual activities in 1 week or as directed. Do not drive while taking narcotics. SEEK MEDICAL CARE IF:  There is increasing  abdominal pain. You feel lightheaded or faint. You have the chills. You have an oral temperature above 102 F (38.9 C). There is pus-like (purulent) drainage from any of the wounds. You are unable to pass gas or have a bowel movement. You feel sick to your stomach (nauseous) or throw up (vomit). MAKE SURE YOU:  Understand these instructions. Will watch your condition. Will get help right away if you are not doing well or get worse.  ExitCare Patient Information 2013 [employer].  

## 2019-05-03 NOTE — Transfer of Care (Signed)
Immediate Anesthesia Transfer of Care Note  Patient: Tanya Moore  Procedure(s) Performed: RIGHT SALPINGECTOMY AND REMOVAL OF ECTOPIC PREGNANCY (N/A )  Patient Location: PACU  Anesthesia Type:General  Level of Consciousness: awake, alert  and oriented  Airway & Oxygen Therapy: Patient Spontanous Breathing and Patient connected to face mask oxygen  Post-op Assessment: Report given to RN and Post -op Vital signs reviewed and stable  Post vital signs: Reviewed and stable  Last Vitals:  Vitals Value Taken Time  BP 105/48 05/03/19 2040  Temp    Pulse 108 05/03/19 2041  Resp 17 05/03/19 2041  SpO2 100 % 05/03/19 2041  Vitals shown include unvalidated device data.  Last Pain:  Vitals:   05/03/19 1912  TempSrc:   PainSc: 8          Complications: No apparent anesthesia complications

## 2019-05-03 NOTE — MAU Note (Signed)
Pain in lower abdomen since earlier today, shoots up the right side

## 2019-05-03 NOTE — Progress Notes (Signed)
Report to [place], CRNA in the main OR. Discussed COVID SWAB RESULT (test still in progress)  Instructed to send patient. Transfer arranged.

## 2019-05-03 NOTE — MAU Note (Signed)
Is 7 wks preg, is having severe pain from the bottom of her uterus up and on the rt side. Started a couple of hours ago.  Thought it was maybe gas or constipation and that was not the case. preg has not been confirmed.

## 2019-05-03 NOTE — Anesthesia Preprocedure Evaluation (Signed)
Anesthesia Evaluation  Patient identified by MRN, date of birth, ID band Patient awake    Reviewed: Allergy & Precautions, NPO status , Patient's Chart, lab work & pertinent test results  History of Anesthesia Complications Negative for: history of anesthetic complications  Airway Mallampati: I  TM Distance: >3 FB Neck ROM: Full    Dental  (+) Dental Advisory Given, Teeth Intact   Pulmonary neg recent URI, former smoker,    breath sounds clear to auscultation       Cardiovascular negative cardio ROS   Rhythm:Regular     Neuro/Psych PSYCHIATRIC DISORDERS Anxiety Depression Bipolar Disorder negative neurological ROS     GI/Hepatic negative GI ROS, Neg liver ROS,   Endo/Other  negative endocrine ROS  Renal/GU negative Renal ROS     Musculoskeletal   Abdominal   Peds  Hematology negative hematology ROS (+)   Anesthesia Other Findings   Reproductive/Obstetrics (+) Pregnancy Intrauterine pregnancy with ectopic rupture per [place]                             Anesthesia Physical Anesthesia Plan  ASA: II  Anesthesia Plan: General   Post-op Pain Management:    Induction: Intravenous, Rapid sequence and Cricoid pressure planned  PONV Risk Score and Plan: 3 and Ondansetron and Dexamethasone  Airway Management Planned: Oral ETT  Additional Equipment: None  Intra-op Plan:   Post-operative Plan: Extubation in OR  Informed Consent: I have reviewed the patients History and Physical, chart, labs and discussed the procedure including the risks, benefits and alternatives for the proposed anesthesia with the patient or authorized representative who has indicated his/her understanding and acceptance.     Dental advisory given  Plan Discussed with: CRNA and Surgeon  Anesthesia Plan Comments:         Anesthesia Quick Evaluation

## 2019-05-03 NOTE — H&P (Signed)
History      CSN: [id number]   Arrival date and time: 05/03/19 1317    First Provider Initiated Contact with Patient 05/03/19 1616          Chief Complaint  Patient presents with  . Abdominal Pain  . Possible Pregnancy   HPI Tanya Moore 38 y.o. 57w2dComes to MAU today with severe RLQ pain which started this morning.  Has vomited twice today.  Pain has become more severe than her last labor and birth.  History of ectopic pregnancy treated with 2 doses of methotrexate.  Is worried that this is an ectopic pregnancy.  Had normal BM this morning x 2.           OB History     Gravida  4   Para  1   Term  1   Preterm      AB  1   Living  1      SAB      TAB  1   Ectopic      Multiple      Live Births                     Past Medical History:  Diagnosis Date  . Allergy    . Anxiety    . Bipolar 1 disorder ([place]    . Depression    . Hyperlipidemia       Past Surgical History:  Procedure Laterality Date  . APPENDECTOMY      . BUNIONECTOMY      . CESAREAN SECTION   2017  . TONSILLECTOMY              Family History  Problem Relation Age of Onset  . Hypertension Mother    . Hypertension Father    . Cancer Father          prostate  . Depression Sister       Social History         Tobacco Use  . Smoking status: Former Smoker      Quit date: 2017      Years since quitting: 3.7  . Smokeless tobacco: Never Used  Substance Use Topics  . Alcohol use: No  . Drug use: No     Allergies:       Allergies  Allergen Reactions  . Lamictal [Lamotrigine] Rash      Patient reported     No medications prior to admission.     Review of Systems  Constitutional: Negative for fever.  Respiratory: Negative for cough and shortness of breath.   Gastrointestinal: Positive for abdominal pain, nausea and vomiting.  Genitourinary: Negative for dysuria, vaginal bleeding and vaginal discharge.    Physical Exam    Blood pressure 110/84, pulse 84,  temperature 98.1 F (36.7 C), temperature source Oral, resp. rate 18, height 5' 4"  (1.626 m), weight 69.9 kg, last menstrual period 03/13/2019, SpO2 100 %, unknown if currently breastfeeding.   Physical Exam  Nursing note and vitals reviewed. Constitutional: She is oriented to person, place, and time. She appears well-developed and well-nourished.  HENT:  Head: Normocephalic.  Eyes: EOM are normal.  Neck: Neck supple.  GI: Soft. Bowel sounds are normal. There is abdominal tenderness. There is guarding. There is no rebound.  RLQ tenderness.  Also has tenderness in RLQ area when LLQ is palpated.  Very tender to the most gentle exam.  Genitourinary:    Genitourinary Comments: Speculum exam:  Vagina - Mod amount of watery blood, some pooling Cervix - No contact bleeding Bimanual exam: Cervix closed Uterus non tender, normal size Adnexa RLQ exquisitely tender and client tearful with the most gentle exam GC/Chlam, wet prep done Chaperone present for exam.    Musculoskeletal: Normal range of motion.  Neurological: She is alert and oriented to person, place, and time.  Skin: Skin is warm and dry.  Psychiatric: She has a normal mood and affect.      MAU Course  Procedures Lab Results Last 24 Hours        Results for orders placed or performed during the hospital encounter of 05/03/19 (from the past 24 hour(s))  Pregnancy, urine POC     Status: Abnormal    Collection Time: 05/03/19  2:05 PM  Result Value Ref Range    Preg Test, Ur POSITIVE (A) NEGATIVE  Urinalysis, Routine w reflex microscopic     Status: Abnormal    Collection Time: 05/03/19  2:33 PM  Result Value Ref Range    Color, Urine YELLOW YELLOW    APPearance HAZY (A) CLEAR    Specific Gravity, Urine 1.018 1.005 - 1.030    pH 6.0 5.0 - 8.0    Glucose, UA NEGATIVE NEGATIVE mg/dL    Hgb urine dipstick NEGATIVE NEGATIVE    Bilirubin Urine NEGATIVE NEGATIVE    Ketones, ur 5 (A) NEGATIVE mg/dL    Protein, ur NEGATIVE NEGATIVE  mg/dL    Nitrite NEGATIVE NEGATIVE    Leukocytes,Ua NEGATIVE NEGATIVE  CBC     Status: Abnormal    Collection Time: 05/03/19  3:06 PM  Result Value Ref Range    WBC 21.7 (H) 4.0 - 10.5 K/uL    RBC 4.38 3.87 - 5.11 MIL/uL    Hemoglobin 13.2 12.0 - 15.0 g/dL    HCT 39.5 36.0 - 46.0 %    MCV 90.2 80.0 - 100.0 fL    MCH 30.1 26.0 - 34.0 pg    MCHC 33.4 30.0 - 36.0 g/dL    RDW 12.9 11.5 - 15.5 %    Platelets 324 150 - 400 K/uL    nRBC 0.0 0.0 - 0.2 %  hCG, quantitative, pregnancy     Status: Abnormal    Collection Time: 05/03/19  3:06 PM  Result Value Ref Range    hCG, Beta Chain, Quant, S 83,815 (H) <5 mIU/mL  Wet prep, genital     Status: Abnormal    Collection Time: 05/03/19  4:30 PM    Specimen: PATH Cytology Cervicovaginal Ancillary Only  Result Value Ref Range    Yeast Wet Prep HPF POC NONE SEEN NONE SEEN    Trich, Wet Prep NONE SEEN NONE SEEN    Clue Cells Wet Prep HPF POC NONE SEEN NONE SEEN    WBC, Wet Prep HPF POC MANY (A) NONE SEEN    Sperm NONE SEEN    Type and screen [place]     Status: None    Collection Time: 05/03/19  5:40 PM  Result Value Ref Range    ABO/RH(D) O POS      Antibody Screen NEG      Sample Expiration          05/06/2019,2359 Performed at [facility], [address] [place], [place] [address]       CLINICAL DATA:  Abdominal pain in pregnancy.   EXAM: OBSTETRIC <14 WK [place] AND TRANSVAGINAL OB US   TECHNIQUE: Both transabdominal and  transvaginal ultrasound examinations were performed for complete evaluation of the gestation as well as the maternal uterus, adnexal regions, and pelvic cul-de-sac. Transvaginal technique was performed to assess early pregnancy.   COMPARISON:  None.   FINDINGS: Intrauterine gestational sac: Single   Yolk sac:  Visualized.   Embryo:  Visualized.   Cardiac Activity: Visualized.   Heart Rate: 138 bpm   CRL:  12.7 mm   7 w   3 d                  [place] EDC: 12/17/2019   Maternal  uterus/adnexae:   Subchorionic hemorrhage: None   Right ovary: Not visualized   Left ovary: Not visualized   Other :There is a complex, heterogeneous mass with internal vascularity measuring 7.0 x 4.3 x 4.3 cm. A normal right ovary is not visualized.   Free fluid:  Small volume of free fluid noted within the pelvis.   IMPRESSION: 1. Single living intrauterine gestation with an estimated gestational age of [redacted] weeks and 3 days. 2. Large complex heterogeneous mass with internal vascularity is identified within the right ovary. Heterotopic pregnancy with possible rupture cannot be excluded. 3. Small volume of free fluid noted within the pelvis.   Critical Value/emergent results were called by telephone at the time of interpretation on 05/03/2019 at 5:18 pm to providerterri burleson , who verbally acknowledged these results.     MDM Exam very suspicious for ectopic pregnancy.  Client very tender.  Bleeding noted on exam but had not been noticed by the client before. Last intake today was around 11 am - cider and bacon.  Nothing to eat or drink after than. Radiology called about the ultrasound. Dr. Harolyn Rutherford notified.  Covid test done.  IVF started. Blood type O positive   Assessment and Plan  Possible heterotopic pregnancy - IUP with FHT and ruptured Right ectopic pregnancy Plan Surgery   Virginia Rochester, NP 05/03/2019, 4:31 PM    Attestation of Attending Supervision of Advanced Practice Provider (PA/CNM/NP): Evaluation and management procedures were performed by the Advanced Practice Provider under my supervision and collaboration.  I have reviewed the Advanced Practice Provider's note and chart, and I agree with the management and plan.   Reviewed images and report with radiologist; the differential diagnoses include ruptured ectopic (heterotopic) pregnancy or ruptured hemorrhagic cyst.  Images do not help in differentiating these.  Patient met and examined, she has significant  pain in RLQ with some guarding noted.  Reviewed findings of ultrasound with patient, explained unclear diagnosis but need for diagnostic laparoscopy given her symptoms and her history. Patient agrees with this plan.  If ectopic pregnancy is seen, will proceed with salpingectomy and removal of ectopic pregnancy.  Risks of surgery including bleeding which may require transfusion or reoperation, infection, injury to bowel or other surrounding organs, need for additional procedures including laparotomy were explained to patient and written informed consent was obtained.  Patient has been NPO since 1100 and she will remain NPO for procedure. Anesthesiology (Dr. Laurie Panda) and OR aware.  Preoperative prophylactic antibiotics and SCDs ordered on call to the OR.  To OR when ready.     Verita Schneiders, MD, [place] for [employer], [place]

## 2019-05-04 ENCOUNTER — Encounter (HOSPITAL_COMMUNITY): Payer: Self-pay | Admitting: Obstetrics & Gynecology

## 2019-05-04 LAB — RPR: RPR Ser Ql: NONREACTIVE

## 2019-05-04 LAB — HIV ANTIBODY (ROUTINE TESTING W REFLEX): HIV Screen 4th Generation wRfx: NONREACTIVE

## 2019-05-04 NOTE — Anesthesia Postprocedure Evaluation (Signed)
Anesthesia Post Note  Patient: Tanya Moore  Procedure(s) Performed: RIGHT SALPINGECTOMY AND REMOVAL OF ECTOPIC PREGNANCY (N/A )     Patient location during evaluation: PACU Anesthesia Type: General Level of consciousness: awake and alert Pain management: pain level controlled Vital Signs Assessment: post-procedure vital signs reviewed and stable Respiratory status: spontaneous breathing, nonlabored ventilation, respiratory function stable and patient connected to nasal cannula oxygen Cardiovascular status: blood pressure returned to baseline and stable Postop Assessment: no apparent nausea or vomiting Anesthetic complications: no    Last Vitals:  Vitals:   05/03/19 2115 05/03/19 2130  BP: 109/71 100/69  Pulse: (!) 103 (!) 101  Resp: (!) 22 (!) 21  Temp:  36.6 C  SpO2: 100% 100%    Last Pain:  Vitals:   05/03/19 2130  TempSrc:   PainSc: 4                  Hadasah Brugger

## 2019-05-05 LAB — SURGICAL PATHOLOGY

## 2019-05-05 LAB — CERVICOVAGINAL ANCILLARY ONLY
Chlamydia: NEGATIVE
Neisseria Gonorrhea: NEGATIVE

## 2019-05-26 ENCOUNTER — Other Ambulatory Visit: Payer: Self-pay

## 2019-05-26 ENCOUNTER — Ambulatory Visit (INDEPENDENT_AMBULATORY_CARE_PROVIDER_SITE_OTHER): Payer: Medicaid Other | Admitting: Obstetrics & Gynecology

## 2019-05-26 ENCOUNTER — Other Ambulatory Visit (HOSPITAL_COMMUNITY)
Admission: RE | Admit: 2019-05-26 | Discharge: 2019-05-26 | Disposition: A | Discharge status: Home / Self Care | Payer: Medicaid Other | Source: Ambulatory Visit | Attending: Obstetrics & Gynecology | Admitting: Obstetrics & Gynecology

## 2019-05-26 ENCOUNTER — Encounter: Payer: Self-pay | Admitting: Obstetrics & Gynecology

## 2019-05-26 VITALS — BP 108/71 | HR 84 | Temp 98.3°F | Wt 159.1 lb

## 2019-05-26 DIAGNOSIS — O0991 Supervision of high risk pregnancy, unspecified, first trimester: Secondary | ICD-10-CM

## 2019-05-26 DIAGNOSIS — O0990 Supervision of high risk pregnancy, unspecified, unspecified trimester: Secondary | ICD-10-CM | POA: Insufficient documentation

## 2019-05-26 DIAGNOSIS — Z98891 History of uterine scar from previous surgery: Secondary | ICD-10-CM

## 2019-05-26 DIAGNOSIS — O00111 Right tubal pregnancy with intrauterine pregnancy: Secondary | ICD-10-CM | POA: Diagnosis present

## 2019-05-26 DIAGNOSIS — Z23 Encounter for immunization: Secondary | ICD-10-CM | POA: Diagnosis not present

## 2019-05-26 DIAGNOSIS — Z3A10 10 weeks gestation of pregnancy: Secondary | ICD-10-CM

## 2019-05-26 NOTE — Progress Notes (Signed)
History:   Tanya Moore is a 38 y.o. G4P1011 at [redacted]w[redacted]d by LMP, early ultrasound being seen today for her first obstetrical visit.  Her obstetrical history is significant for this pregnancy being a heterotopic pregnancy; she had a ruptured right tubal ectopic pregnancy and underwent salpingectomy on 05/03/19. History of previous ectopic pregnancy prior to this.  Also had a term cesarean section, done for failure to progress. Desires elective repeat cesarean section this time. From a postoperative stanpoint, she has minimal pain, no bleeding or other concerns. Pregnancy history fully reviewed.  Patient reports no complaints.      HISTORY: OB History  Gravida Para Term Preterm AB Living  4 1 1  0 1 1  SAB TAB Ectopic Multiple Live Births  0 1 0 0 0    # Outcome Date GA Lbr Len/2nd Weight Sex Delivery Anes PTL Lv  4 Current           3 Gravida           2 Term      CS-Unspec     1 TAB             Last pap smear was done 2018 and was normal  Past Medical History:  Diagnosis Date  . Allergy   . Anxiety   . Bipolar 1 disorder (H[facility])   . Depression   . Hyperlipidemia    Past Surgical History:  Procedure Laterality Date  . APPENDECTOMY    . BUNIONECTOMY    . CESAREAN SECTION  2017  . DIAGNOSTIC LAPAROSCOPY WITH REMOVAL OF ECTOPIC PREGNANCY N/A 05/03/2019   Procedure: RIGHT SALPINGECTOMY AND REMOVAL OF ECTOPIC PREGNANCY;  Surgeon: 05/05/2019, MD;  Location: [facility] OR;  Service: Gynecology;  Laterality: N/A;  . TONSILLECTOMY     Family History  Problem Relation Age of Onset  . Hypertension Mother   . Hypertension Father   . Cancer Father        prostate  . Depression Sister    Social History   Tobacco Use  . Smoking status: Former Smoker    Quit date: 2017    Years since quitting: 3.7  . Smokeless tobacco: Never Used  Substance Use Topics  . Alcohol use: No  . Drug use: No   Allergies  Allergen Reactions  . Lamictal [Lamotrigine] Rash    Patient reported    Current Outpatient Medications on File Prior to Visit  Medication Sig Dispense Refill  . Prenatal Vit-Fe Fumarate-FA (PRENATAL MULTIVITAMIN) TABS tablet Take 1 tablet by mouth daily at 12 noon.     No current facility-administered medications on file prior to visit.     Review of Systems Pertinent items noted in HPI and remainder of comprehensive ROS otherwise negative. Physical Exam:   Vitals:   05/26/19 0843  BP: 108/71  Pulse: 84  Temp: 98.3 F (36.8 C)  Weight: 159 lb 1.6 oz (72.2 kg)   Fetal Heart Rate (bpm): 155 on Bedside Ultrasound for FHR check: Patient informed that the ultrasound is considered a limited obstetric ultrasound and is not intended to be a complete ultrasound exam.  Patient also informed that the ultrasound is not being completed with the intent of assessing for fetal or placental anomalies or any pelvic abnormalities.  Explained that the purpose of today's ultrasound is to assess for fetal heart rate.  Patient acknowledges the purpose of the exam and the limitations of the study.  Uterus:     Pelvic Exam: Perineum:  no hemorrhoids, normal perineum   Vulva: normal external genitalia, no lesions   Vagina:  normal mucosa, normal discharge   Cervix: no lesions and normal, pap smear done. Significant bleeding during pap smear, worried about adequacy of specimen due to blood. Had to use silver nitrate to help with hemostasis.    Adnexa: normal adnexa and no mass, fullness, tenderness   Bony Pelvis: average  System: General: well-developed, well-nourished female in no acute distress   Breasts:  normal appearance, no masses or tenderness bilaterally   Skin: normal coloration and turgor, no rashes   Neurologic: oriented, normal, negative, normal mood   Extremities: normal strength, tone, and muscle mass, ROM of all joints is normal   HEENT PERRLA, extraocular movement intact and sclera clear, anicteric   Mouth/Teeth mucous membranes moist, pharynx normal without  lesions and dental hygiene good   Neck supple and no masses   Cardiovascular: regular rate and rhythm   Respiratory:  no respiratory distress, normal breath sounds   Abdomen: soft, non-tender; bowel sounds normal; no masses,  no organomegaly      Assessment:    Pregnancy: [id number] Patient Active Problem List   Diagnosis Date Noted  . Supervision of high risk pregnancy, antepartum 05/26/2019  . History of low transverse cesarean section 05/26/2019  . Hemoperitoneum due to rupture of right tubal ectopic pregnancy 05/03/2019  . Ectopic pregnancy with intrauterine pregnancy/Heterotopic Pregnancy 05/03/2019  . Hypercholesteremia 02/19/2019  . Skin lesion of left arm 02/19/2019  . History of ectopic pregnancy in 11/2018 11/20/2018     Plan:    1. Right tubal pregnancy with intrauterine pregnancy s/p salpingectomy Doing well, no concerns.    2. History of low transverse cesarean section Counseled regarding TOLAC vs RCS. All questions answered.  Patient elects for ERCS.   3. Supervision of high risk pregnancy, antepartum - Culture, OB Urine - Genetic Screening - Obstetric Panel, Including HIV - [place] MFM OB DETAIL +14 WK; Future - [facility] AMB BABYSCRIPTS OPT IN - Flu Vaccine QUAD 36+ mos IM - Hemoglobin A1c - Cytology - PAP( [place]) - [place] MFM Fetal Nuchal Translucency; Future  Initial labs drawn. Continue prenatal vitamins. Genetic Screening discussed, First trimester screen and NIPS: ordered. Ultrasound discussed; fetal anatomic survey: ordered. Problem list reviewed and updated. The nature of [facility] with multiple MDs and other Advanced Practice Providers was explained to patient; also emphasized that residents, students are part of our team. Routine obstetric precautions reviewed. Return in about 4 weeks (around 06/23/2019) for Virtual OB Visit.     Verita Schneiders, MD, [place] for  [employer], [place]

## 2019-05-26 NOTE — Patient Instructions (Signed)
First Trimester of Pregnancy The first trimester of pregnancy is from week 1 until the end of week 13 (months 1 through 3). A week after a sperm fertilizes an egg, the egg will implant on the wall of the uterus. This embryo will begin to develop into a baby. Genes from you and your partner will form the baby. The female genes will determine whether the baby will be a boy or a girl. At 6-8 weeks, the eyes and face will be formed, and the heartbeat can be seen on ultrasound. At the end of 12 weeks, all the baby's organs will be formed. Now that you are pregnant, you will want to do everything you can to have a healthy baby. Two of the most important things are to get good prenatal care and to follow your health care provider's instructions. Prenatal care is all the medical care you receive before the baby's birth. This care will help prevent, find, and treat any problems during the pregnancy and childbirth. Body changes during your first trimester Your body goes through many changes during pregnancy. The changes vary from woman to woman.  You may gain or lose a couple of pounds at first.  You may feel sick to your stomach (nauseous) and you may throw up (vomit). If the vomiting is uncontrollable, call your health care provider.  You may tire easily.  You may develop headaches that can be relieved by medicines. All medicines should be approved by your health care provider.  You may urinate more often. Painful urination may mean you have a bladder infection.  You may develop heartburn as a result of your pregnancy.  You may develop constipation because certain hormones are causing the muscles that push stool through your intestines to slow down.  You may develop hemorrhoids or swollen veins (varicose veins).  Your breasts may begin to grow larger and become tender. Your nipples may stick out more, and the tissue that surrounds them (areola) may become darker.  Your gums may bleed and may be  sensitive to brushing and flossing.  Dark spots or blotches (chloasma, mask of pregnancy) may develop on your face. This will likely fade after the baby is born.  Your menstrual periods will stop.  You may have a loss of appetite.  You may develop cravings for certain kinds of food.  You may have changes in your emotions from day to day, such as being excited to be pregnant or being concerned that something may go wrong with the pregnancy and baby.  You may have more vivid and strange dreams.  You may have changes in your hair. These can include thickening of your hair, rapid growth, and changes in texture. Some women also have hair loss during or after pregnancy, or hair that feels dry or thin. Your hair will most likely return to normal after your baby is born. What to expect at prenatal visits During a routine prenatal visit:  You will be weighed to make sure you and the baby are growing normally.  Your blood pressure will be taken.  Your abdomen will be measured to track your baby's growth.  The fetal heartbeat will be listened to between weeks 10 and 14 of your pregnancy.  Test results from any previous visits will be discussed. Your health care provider may ask you:  How you are feeling.  If you are feeling the baby move.  If you have had any abnormal symptoms, such as leaking fluid, bleeding, severe headaches, or abdominal   cramping.  If you are using any tobacco products, including cigarettes, chewing tobacco, and electronic cigarettes.  If you have any questions. Other tests that may be performed during your first trimester include:  Blood tests to find your blood type and to check for the presence of any previous infections. The tests will also be used to check for low iron levels (anemia) and protein on red blood cells (Rh antibodies). Depending on your risk factors, or if you previously had diabetes during pregnancy, you may have tests to check for high blood sugar  that affects pregnant women (gestational diabetes).  Urine tests to check for infections, diabetes, or protein in the urine.  An ultrasound to confirm the proper growth and development of the baby.  Fetal screens for spinal cord problems (spina bifida) and Down syndrome.  HIV (human immunodeficiency virus) testing. Routine prenatal testing includes screening for HIV, unless you choose not to have this test.  You may need other tests to make sure you and the baby are doing well. Follow these instructions at home: Medicines  Follow your health care provider's instructions regarding medicine use. Specific medicines may be either safe or unsafe to take during pregnancy.  Take a prenatal vitamin that contains at least 600 micrograms (mcg) of folic acid.  If you develop constipation, try taking a stool softener if your health care provider approves. Eating and drinking   Eat a balanced diet that includes fresh fruits and vegetables, whole grains, good sources of protein such as meat, eggs, or tofu, and low-fat dairy. Your health care provider will help you determine the amount of weight gain that is right for you.  Avoid raw meat and uncooked cheese. These carry germs that can cause birth defects in the baby.  Eating four or five small meals rather than three large meals a day may help relieve nausea and vomiting. If you start to feel nauseous, eating a few soda crackers can be helpful. Drinking liquids between meals, instead of during meals, also seems to help ease nausea and vomiting.  Limit foods that are high in fat and processed sugars, such as fried and sweet foods.  To prevent constipation: ? Eat foods that are high in fiber, such as fresh fruits and vegetables, whole grains, and beans. ? Drink enough fluid to keep your urine clear or pale yellow. Activity  Exercise only as directed by your health care provider. Most women can continue their usual exercise routine during  pregnancy. Try to exercise for 30 minutes at least 5 days a week. Exercising will help you: ? Control your weight. ? Stay in shape. ? Be prepared for labor and delivery.  Experiencing pain or cramping in the lower abdomen or lower back is a good sign that you should stop exercising. Check with your health care provider before continuing with normal exercises.  Try to avoid standing for long periods of time. Move your legs often if you must stand in one place for a long time.  Avoid heavy lifting.  Wear low-heeled shoes and practice good posture.  You may continue to have sex unless your health care provider tells you not to. Relieving pain and discomfort  Wear a good support bra to relieve breast tenderness.  Take warm sitz baths to soothe any pain or discomfort caused by hemorrhoids. Use hemorrhoid cream if your health care provider approves.  Rest with your legs elevated if you have leg cramps or low back pain.  If you develop varicose veins in   your legs, wear support hose. Elevate your feet for 15 minutes, 3-4 times a day. Limit salt in your diet. Prenatal care  Schedule your prenatal visits by the twelfth week of pregnancy. They are usually scheduled monthly at first, then more often in the last 2 months before delivery.  Write down your questions. Take them to your prenatal visits.  Keep all your prenatal visits as told by your health care provider. This is important. Safety  Wear your seat belt at all times when driving.  Make a list of emergency phone numbers, including numbers for family, friends, the hospital, and police and fire departments. General instructions  Ask your health care provider for a referral to a local prenatal education class. Begin classes no later than the beginning of month 6 of your pregnancy.  Ask for help if you have counseling or nutritional needs during pregnancy. Your health care provider can offer advice or refer you to specialists for help  with various needs.  Do not use hot tubs, steam rooms, or saunas.  Do not douche or use tampons or scented sanitary pads.  Do not cross your legs for long periods of time.  Avoid cat litter boxes and soil used by cats. These carry germs that can cause birth defects in the baby and possibly loss of the fetus by miscarriage or stillbirth.  Avoid all smoking, herbs, alcohol, and medicines not prescribed by your health care provider. Chemicals in these products affect the formation and growth of the baby.  Do not use any products that contain nicotine or tobacco, such as cigarettes and e-cigarettes. If you need help quitting, ask your health care provider. You may receive counseling support and other resources to help you quit.  Schedule a dentist appointment. At home, brush your teeth with a soft toothbrush and be gentle when you floss. Contact a health care provider if:  You have dizziness.  You have mild pelvic cramps, pelvic pressure, or nagging pain in the abdominal area.  You have persistent nausea, vomiting, or diarrhea.  You have a bad smelling vaginal discharge.  You have pain when you urinate.  You notice increased swelling in your face, hands, legs, or ankles.  You are exposed to fifth disease or chickenpox.  You are exposed to German measles (rubella) and have never had it. Get help right away if:  You have a fever.  You are leaking fluid from your vagina.  You have spotting or bleeding from your vagina.  You have severe abdominal cramping or pain.  You have rapid weight gain or loss.  You vomit blood or material that looks like coffee grounds.  You develop a severe headache.  You have shortness of breath.  You have any kind of trauma, such as from a fall or a car accident. Summary  The first trimester of pregnancy is from week 1 until the end of week 13 (months 1 through 3).  Your body goes through many changes during pregnancy. The changes vary from  woman to woman.  You will have routine prenatal visits. During those visits, your health care provider will examine you, discuss any test results you may have, and talk with you about how you are feeling. This information is not intended to replace advice given to you by your health care provider. Make sure you discuss any questions you have with your health care provider. Document Released: 07/21/2001 Document Revised: 07/09/2017 Document Reviewed: 07/08/2016 Elsevier Patient Education  2020 Elsevier Inc.  

## 2019-05-27 LAB — OBSTETRIC PANEL, INCLUDING HIV
Antibody Screen: NEGATIVE
Basophils Absolute: 0.1 10*3/uL (ref 0.0–0.2)
Basos: 1 %
EOS (ABSOLUTE): 0.1 10*3/uL (ref 0.0–0.4)
Eos: 1 %
HIV Screen 4th Generation wRfx: NONREACTIVE
Hematocrit: 38.7 % (ref 34.0–46.6)
Hemoglobin: 12.5 g/dL (ref 11.1–15.9)
Hepatitis B Surface Ag: NEGATIVE
Immature Grans (Abs): 0 10*3/uL (ref 0.0–0.1)
Immature Granulocytes: 0 %
Lymphocytes Absolute: 2.7 10*3/uL (ref 0.7–3.1)
Lymphs: 29 %
MCH: 29.8 pg (ref 26.6–33.0)
MCHC: 32.3 g/dL (ref 31.5–35.7)
MCV: 92 fL (ref 79–97)
Monocytes Absolute: 0.5 10*3/uL (ref 0.1–0.9)
Monocytes: 5 %
Neutrophils Absolute: 6.1 10*3/uL (ref 1.4–7.0)
Neutrophils: 64 %
Platelets: 309 10*3/uL (ref 150–450)
RBC: 4.2 x10E6/uL (ref 3.77–5.28)
RDW: 12.6 % (ref 11.7–15.4)
RPR Ser Ql: NONREACTIVE
Rh Factor: POSITIVE
Rubella Antibodies, IGG: 11.8 index (ref >0.99)
WBC: 9.4 10*3/uL (ref 3.4–10.8)

## 2019-05-27 LAB — HEMOGLOBIN A1C
Est. average glucose Bld gHb Est-mCnc: 94 mg/dL
Hgb A1c MFr Bld: 4.9 % (ref 4.8–5.6)

## 2019-05-28 LAB — CULTURE, OB URINE

## 2019-05-28 LAB — URINE CULTURE, OB REFLEX

## 2019-06-01 LAB — CYTOLOGY - PAP
Chlamydia: NEGATIVE
Comment: NEGATIVE
Comment: NEGATIVE
Comment: NORMAL
Diagnosis: NEGATIVE
High risk HPV: NEGATIVE
Neisseria Gonorrhea: NEGATIVE

## 2019-06-05 ENCOUNTER — Encounter (HOSPITAL_COMMUNITY): Payer: Self-pay

## 2019-06-06 ENCOUNTER — Encounter: Payer: Self-pay | Admitting: *Deleted

## 2019-06-07 ENCOUNTER — Encounter (HOSPITAL_COMMUNITY): Payer: Self-pay

## 2019-06-07 ENCOUNTER — Ambulatory Visit (HOSPITAL_COMMUNITY)
Admission: RE | Admit: 2019-06-07 | Discharge: 2019-06-07 | Disposition: A | Discharge status: Home / Self Care | Payer: Medicaid Other | Source: Ambulatory Visit | Attending: Obstetrics & Gynecology | Admitting: Obstetrics & Gynecology

## 2019-06-07 ENCOUNTER — Other Ambulatory Visit: Payer: Self-pay

## 2019-06-07 ENCOUNTER — Ambulatory Visit (HOSPITAL_COMMUNITY): Payer: Medicaid Other | Admitting: *Deleted

## 2019-06-07 ENCOUNTER — Ambulatory Visit (HOSPITAL_COMMUNITY): Payer: Medicaid Other

## 2019-06-07 VITALS — BP 112/66 | HR 83 | Temp 98.3°F

## 2019-06-07 DIAGNOSIS — Z3A12 12 weeks gestation of pregnancy: Secondary | ICD-10-CM | POA: Diagnosis not present

## 2019-06-07 DIAGNOSIS — Z3682 Encounter for antenatal screening for nuchal translucency: Secondary | ICD-10-CM

## 2019-06-07 DIAGNOSIS — O0990 Supervision of high risk pregnancy, unspecified, unspecified trimester: Secondary | ICD-10-CM

## 2019-06-07 DIAGNOSIS — O09521 Supervision of elderly multigravida, first trimester: Secondary | ICD-10-CM | POA: Diagnosis not present

## 2019-06-07 DIAGNOSIS — O34219 Maternal care for unspecified type scar from previous cesarean delivery: Secondary | ICD-10-CM | POA: Diagnosis not present

## 2019-06-13 ENCOUNTER — Encounter: Payer: Self-pay | Admitting: *Deleted

## 2019-06-21 ENCOUNTER — Other Ambulatory Visit: Payer: Self-pay | Admitting: *Deleted

## 2019-06-21 DIAGNOSIS — O0990 Supervision of high risk pregnancy, unspecified, unspecified trimester: Secondary | ICD-10-CM

## 2019-06-21 MED ORDER — BLOOD PRESSURE KIT DEVI: 1.0000 | 0 refills | Status: DC | PRN

## 2019-06-21 NOTE — Progress Notes (Signed)
boo

## 2019-06-23 ENCOUNTER — Telehealth (INDEPENDENT_AMBULATORY_CARE_PROVIDER_SITE_OTHER): Payer: Medicaid Other | Admitting: Obstetrics & Gynecology

## 2019-06-23 VITALS — Wt 154.0 lb

## 2019-06-23 DIAGNOSIS — Z98891 History of uterine scar from previous surgery: Secondary | ICD-10-CM

## 2019-06-23 DIAGNOSIS — O0992 Supervision of high risk pregnancy, unspecified, second trimester: Secondary | ICD-10-CM

## 2019-06-23 DIAGNOSIS — O0990 Supervision of high risk pregnancy, unspecified, unspecified trimester: Secondary | ICD-10-CM

## 2019-06-23 DIAGNOSIS — Z3A14 14 weeks gestation of pregnancy: Secondary | ICD-10-CM

## 2019-06-23 NOTE — Progress Notes (Signed)
TELEHEALTH OBSTETRICS PRENATAL VIRTUAL VIDEO VISIT ENCOUNTER NOTE  Provider [facility]: [facility]   I connected with Tanya OverlieHeather D [place] on 06/23/19 at  8:15 AM EST by MyChart Video Encounter at home and verified that I am speaking with the correct person using two identifiers.   I discussed the limitations, risks, security and privacy concerns of performing an evaluation and management service virtually and the availability of in person appointments. I also discussed with the patient that there may be a patient responsible charge related to this service. The patient expressed understanding and agreed to proceed. Subjective:  Tanya Moore is a 38 y.o. G4P1021 at 2467w4d being seen today for ongoing prenatal care.  She is currently monitored for the following issues for this high-risk pregnancy and has History of ectopic pregnancy in 11/2018; Hypercholesteremia; Skin lesion of left arm; Hemoperitoneum due to rupture of right tubal ectopic pregnancy; Ectopic pregnancy with intrauterine pregnancy/Heterotopic Pregnancy; Supervision of high risk pregnancy, antepartum; and History of low transverse cesarean section on their problem list.  Patient reports no complaints.  Contractions: Not present. Vag. Bleeding: None.  Movement: Absent. Denies any leaking of fluid.   The following portions of the patient's history were reviewed and updated as appropriate: allergies, current medications, past family history, past medical history, past social history, past surgical history and problem list.   Objective:   Vitals:   06/23/19 0812  Weight: 154 lb (69.9 kg)    Fetal Status:     Movement: Absent     General:  Alert, oriented and cooperative. Patient is in no acute distress.  Respiratory: Normal respiratory effort, no problems with respiration noted  Mental Status: Normal mood and affect. Normal behavior. Normal judgment and thought content.  Rest of physical exam deferred due  to type of encounter  Imaging: [place] Mfm Fetal Nuchal Translucency  Result Date: 06/07/2019 ----------------------------------------------------------------------  OBSTETRICS REPORT                       (Signed Final 06/07/2019 02:51 pm) ---------------------------------------------------------------------- Patient Info  ID #:       [id number]                          D.O.B.:  [birth year] (37 yrs)  Name:       Tanya Moore                 Visit Date: 06/07/2019 11:47 am ---------------------------------------------------------------------- Performed By  Performed By:     Magnus Ivaneresa Jones           Ref. Address:     [address]                    [address]                                                             Road  [place], [place]  Attending:        Johnell Comings MD         [facility]  Referred By:      Osborne Oman MD ---------------------------------------------------------------------- Orders   #  Description                          Code         Ordered By   1  [place] MFM FETAL NUCHAL                  [phone]      Verita Schneiders      TRANSLUCENCY  ----------------------------------------------------------------------   #  Order #                    Accession #                 Episode #   1  [id number]                  [id number]                  [id number]  ---------------------------------------------------------------------- Indications   Advanced maternal age multigravida 53+,        O43.521   first trimester   Encounter for nuchal translucency (Low risk    Z36.82   NIPS)   Prev heterotopic preg w/ectopic on Rt   Previous cesarean delivery, antepartum         O34.219   [redacted] weeks gestation of pregnancy                Z3A.12   ---------------------------------------------------------------------- Fetal Evaluation  Num Of Fetuses:         1  Fetal Heart Rate(bpm):  157  Cardiac Activity:       Observed  Presentation:           Breech  Placenta:               Fundal  P. Cord Insertion:      Visualized, central  Amniotic Fluid  AFI FV:      Within normal limits ---------------------------------------------------------------------- Biometry  CRL:     61.82  mm     G. Age:  70w 3d                  EDD:   12/17/19 ---------------------------------------------------------------------- Gestational Age  LMP:           11w 4d        Date:  03/18/19  EDD:   12/23/19  Best:          12w 3d     Det. By:  Marcella Dubs         EDD:   12/17/19                                      (05/03/19) ---------------------------------------------------------------------- 1st Trimester Genetic Sonogram Screening  CRL:           61.82  mm    G. Age:   12w 3d                 EDD:   12/17/19  Nuc Trans:       1.4  mm ---------------------------------------------------------------------- Anatomy  Cranium:               Appears normal         Bladder:                Appears normal  Stomach:               Seen                   Upper Extremities:      Seen  Abdominal Wall:        Appears nml (cord      Lower Extremities:      Seen                         insert, abd wall) ---------------------------------------------------------------------- Comments  This patient was seen for a first trimester nuchal translucency  measurement due to advanced maternal age.  She denies  any significant past medical history and denies any problems  in her current pregnancy.  The patient reports that she already had a cell free DNA test  drawn which indicated a low risk for trisomy 21, 18, and 13.  A female fetus is predicted.  The crown-rump length measured today is consistent with her  gestational age.  A normal nuchal translucency of 1.4 mm was noted today.  The patient  will return at around 19 weeks for her fetal  anatomy scan. ----------------------------------------------------------------------                   Ma Rings, MD Electronically Signed Final Report   06/07/2019 02:51 pm ----------------------------------------------------------------------   Assessment and Plan:  Pregnancy: [id number] at [redacted]w[redacted]d 1. History of low transverse cesarean section Undecided about mode of delivery. Will do research about this.  2. Supervision of high risk pregnancy, antepartum Low risk NIPS.  Anatomy scan scheduled on 07/25/19. Counseled about AFP screen, will get on same day at scan.  - AFP only (15.0-22.6); Future No other complaints or concerns.  Routine obstetric precautions reviewed.  I discussed the assessment and treatment plan with the patient. The patient was provided an opportunity to ask questions and all were answered. The patient agreed with the plan and demonstrated an understanding of the instructions. The patient was advised to call back or seek an in-person office evaluation/go to [facility] at [facility] for any urgent or concerning symptoms. Please refer to After Visit Summary for other counseling recommendations.   I provided 10 minutes of face-to-face time during this encounter.  Return in about 1 month (around 07/25/2019) for OFFICE OB Visit and lab draw for AFP only (has 9 am MFM  ultrasound same day, wants earlier appt).  Future Appointments  Date Time Provider Department Center  07/25/2019  9:00 AM WH-MFC NURSE [facility]-US  07/25/2019  9:00 AM WH-MFC [place] 3 WH-MFCUS M[facility]-US    Jaynie Collins, MD [facility], [facility]

## 2019-06-23 NOTE — Patient Instructions (Signed)
Vaginal Birth After Cesarean Delivery  Vaginal birth after cesarean delivery (VBAC) is giving birth vaginally after previously delivering a baby through a cesarean section (C-section). A VBAC may be a safe option for you, depending on your health and other factors. It is important to discuss VBAC with your health care provider early in your pregnancy so you can understand the risks, benefits, and options. Having these discussions early will give you time to make your birth plan. Who are the best candidates for VBAC? The best candidates for VBAC are women who:  Have had one or two prior cesarean deliveries, and the incision made during the delivery was horizontal (low transverse).  Do not have a vertical (classical) scar on their uterus.  Have not had a tear in the wall of their uterus (uterine rupture).  Plan to have more pregnancies. A VBAC is also more likely to be successful:  In women who have previously given birth vaginally.  When labor starts by itself (spontaneously) before the due date. What are the benefits of VBAC? The benefits of delivering your baby vaginally instead of by a cesarean delivery include:  A shorter hospital stay.  A faster recovery time.  Less pain.  Avoiding risks associated with major surgery, such as infection and blood clots.  Less blood loss and less need for donated blood (transfusions). What are the risks of VBAC? The main risk of attempting a VBAC is that it may fail, forcing your health care provider to deliver your baby by a C-section. Other risks are rare and include:  Tearing (rupture) of the scar from a past cesarean delivery.  Other risks associated with vaginal deliveries. If a repeat cesarean delivery is needed, the risks include:  Blood loss.  Infection.  Blood clot.  Damage to surrounding organs.  Removal of the uterus (hysterectomy), if it is damaged.  Placenta problems in future pregnancies. What else should I know  about my options? Delivering a baby through a VBAC is similar to having a normal spontaneous vaginal delivery. Therefore, it is safe:  To try with twins.  For your health care provider to try to turn the baby from a breech position (external cephalic version) during labor.  With epidural analgesia for pain relief. Consider where you would like to deliver your baby. VBAC should be attempted in facilities where an emergency cesarean delivery can be performed. VBAC is not recommended for home births. Any changes in your health or your baby's health during your pregnancy may make it necessary to change your initial decision about VBAC. Your health care provider may recommend that you do not attempt a VBAC if:  Your baby's suspected weight is 8.8 lb (4 kg) or more.  You have preeclampsia. This is a condition that causes high blood pressure along with other symptoms, such as swelling and headaches.  You will have VBAC less than 19 months after your cesarean delivery.  You are past your due date.  You need to have labor started (induced) because your cervix is not ready for labor (unfavorable). Where to find more information  American Pregnancy Association: americanpregnancy.org  American Congress of Obstetricians and Gynecologists: acog.org Summary  Vaginal birth after cesarean delivery (VBAC) is giving birth vaginally after previously delivering a baby through a cesarean section (C-section). A VBAC may be a safe option for you, depending on your health and other factors.  Discuss VBAC with your health care provider early in your pregnancy so you can understand the risks, benefits, options, and   have plenty of time to make your birth plan.  The main risk of attempting a VBAC is that it may fail, forcing your health care provider to deliver your baby by a C-section. Other risks are rare. This information is not intended to replace advice given to you by your health care provider. Make sure  you discuss any questions you have with your health care provider. Document Released: 01/17/2007 Document Revised: 11/22/2018 Document Reviewed: 11/03/2016 Elsevier Patient Education  2020 Elsevier Inc.  

## 2019-06-23 NOTE — Progress Notes (Signed)
I connected with  Tanya Moore on 06/23/19 at  8:15 AM EST by telephone and verified that I am speaking with the correct person using two identifiers.   I discussed the limitations, risks, security and privacy concerns of performing an evaluation and management service by telephone and the availability of in person appointments. I also discussed with the patient that there may be a patient responsible charge related to this service. The patient expressed understanding and agreed to proceed.  Pt unable to check BP; has not received BP cuff. Ordered 06/21/19. Explained to pt she will need to answer phone call from [facility] [facility] to set up pick-up or delivery.  Annabell Howells, RN 06/23/2019  8:11 AM

## 2019-07-14 DIAGNOSIS — O0990 Supervision of high risk pregnancy, unspecified, unspecified trimester: Secondary | ICD-10-CM | POA: Diagnosis not present

## 2019-07-25 ENCOUNTER — Ambulatory Visit (INDEPENDENT_AMBULATORY_CARE_PROVIDER_SITE_OTHER): Payer: Medicaid Other | Admitting: Student

## 2019-07-25 ENCOUNTER — Ambulatory Visit (HOSPITAL_COMMUNITY): Payer: Medicaid Other | Admitting: *Deleted

## 2019-07-25 ENCOUNTER — Ambulatory Visit (HOSPITAL_COMMUNITY)
Admission: RE | Admit: 2019-07-25 | Discharge: 2019-07-25 | Disposition: A | Discharge status: Home / Self Care | Payer: Medicaid Other | Source: Ambulatory Visit | Attending: Obstetrics and Gynecology | Admitting: Obstetrics and Gynecology

## 2019-07-25 ENCOUNTER — Other Ambulatory Visit (HOSPITAL_COMMUNITY): Payer: Self-pay | Admitting: *Deleted

## 2019-07-25 ENCOUNTER — Telehealth: Payer: Self-pay

## 2019-07-25 ENCOUNTER — Other Ambulatory Visit: Payer: Self-pay

## 2019-07-25 ENCOUNTER — Encounter (HOSPITAL_COMMUNITY): Payer: Self-pay

## 2019-07-25 DIAGNOSIS — Z3A19 19 weeks gestation of pregnancy: Secondary | ICD-10-CM | POA: Diagnosis not present

## 2019-07-25 DIAGNOSIS — O43192 Other malformation of placenta, second trimester: Secondary | ICD-10-CM | POA: Diagnosis not present

## 2019-07-25 DIAGNOSIS — O34219 Maternal care for unspecified type scar from previous cesarean delivery: Secondary | ICD-10-CM | POA: Diagnosis not present

## 2019-07-25 DIAGNOSIS — O09522 Supervision of elderly multigravida, second trimester: Secondary | ICD-10-CM

## 2019-07-25 DIAGNOSIS — O0990 Supervision of high risk pregnancy, unspecified, unspecified trimester: Secondary | ICD-10-CM

## 2019-07-25 DIAGNOSIS — O09529 Supervision of elderly multigravida, unspecified trimester: Secondary | ICD-10-CM

## 2019-07-25 DIAGNOSIS — O0992 Supervision of high risk pregnancy, unspecified, second trimester: Secondary | ICD-10-CM

## 2019-07-25 MED ORDER — TRIAMCINOLONE ACETONIDE 0.5 % EX CREA: 1.0000 "application " | TOPICAL_CREAM | Freq: Three times a day (TID) | CUTANEOUS | 2 refills | Status: DC

## 2019-07-25 NOTE — Progress Notes (Signed)
Patient ID: KLA BILY, female   DOB: 07-Oct-1980, 38 y.o.   MRN: [id number]     PRENATAL VISIT NOTE  Subjective:  STORMEY WILBORN is a 38 y.o. G4P1021 at [redacted]w[redacted]d being seen today for ongoing prenatal care.  She is currently monitored for the following issues for this low-risk pregnancy and has History of ectopic pregnancy in 11/2018; Hypercholesteremia; Skin lesion of left arm; Hemoperitoneum due to rupture of right tubal ectopic pregnancy; Ectopic pregnancy with intrauterine pregnancy/Heterotopic Pregnancy; Supervision of high risk pregnancy, antepartum; and History of low transverse cesarean section on their problem list.  Patient reports rash on her bottom and vulva that started over a week ago. It itches and it is starting to spread. .  Contractions: Not present. Vag. Bleeding: None.  Movement: Present. Denies leaking of fluid.   The following portions of the patient's history were reviewed and updated as appropriate: allergies, current medications, past family history, past medical history, past social history, past surgical history and problem list.   Objective:   Vitals:   07/25/19 0818  BP: 115/89  Pulse: 98  Weight: 171 lb 3.2 oz (77.7 kg)    Fetal Status: Fetal Heart Rate (bpm): 147   Movement: Present     General:  Alert, oriented and cooperative. Patient is in no acute distress.  Skin: Skin is warm and dry. No rash noted.   Cardiovascular: Normal heart rate noted  Respiratory: Normal respiratory effort, no problems with respiration noted  Abdomen: Soft, gravid, appropriate for gestational age.  Pain/Pressure: Absent     Pelvic: Cervical exam deferred        Extremities: Normal range of motion.  Edema: Trace Pink, raised macular rash on labia and perianal area.   Mental Status: Normal mood and affect. Normal behavior. Normal judgment and thought content.   Assessment and Plan:  Pregnancy: G4P1021 at [redacted]w[redacted]d 1. Supervision of high risk pregnancy, antepartum  -Patient will  call pharmacy and find out about her BP cuff; CMA will call as well.  -AFP today -Dr. Kennon Rounds examined rash; recommends try Kenalog topical steroid first.  -anatomy scan today -gave guidance about BP monitoring and concerning values; what to do if greater than 140/90.  -gave guidance about 2 hour glucose test Preterm labor symptoms and general obstetric precautions including but not limited to vaginal bleeding, contractions, leaking of fluid and fetal movement were reviewed in detail with the patient. Please refer to After Visit Summary for other counseling recommendations.   Return in about 7 weeks (around 09/12/2019), or 2 hour gtt; LROB in person.  Future Appointments  Date Time Provider [place]  07/25/2019  9:00 AM [place] [facility]  07/25/2019  9:00 AM [facility] [place] [facility]    Starr [place], CNM

## 2019-07-25 NOTE — Patient Instructions (Signed)
  Glucose Tolerance Test Why am I having this test? The glucose tolerance test (GTT) is done to check how your body processes sugar (glucose). This is one of several tests used to diagnose diabetes (diabetes mellitus). Your health care provider may recommend this test if you:  Have a family history of diabetes.  Are very overweight (obese).  Have infections that keep coming back (recurring).  Have had a lot of wounds that did not heal quickly, especially on your legs and feet.  Are a woman and have a history of giving birth to very large babies or a history of repeated fetal loss (stillbirth).  Have had high glucose levels in your urine or blood: ? During a past pregnancy. ? After a heart attack, surgery, or prolonged periods of high stress. What is being tested? This test measures the amount of glucose in your blood at different times during a period of 2 hours. This indicates how well your body is able to process glucose. What kind of sample is taken?  Blood samples are required for this test. They are usually collected by inserting a needle into a blood vessel. How do I prepare for this test?  For 3 days before your test, eat normally. Have plenty of carbohydrate-rich foods.  Follow instructions from your health care provider about: ? Eating or drinking restrictions on the day of the test. You may be asked to not eat or drink anything other than water (fast) starting 8-12 hours before the test. ? Changing or stopping your regular medicines. Some medicines may interfere with this test. Tell a health care provider about:  All medicines you are taking, including vitamins, herbs, eye drops, creams, and over-the-counter medicines.  Any blood disorders you have.  Any surgeries you have had.  Any medical conditions you have.  Whether you are pregnant or may be pregnant. What happens during the test? First, your blood glucose will be measured. This is referred to as your  fasting blood glucose, since you fasted before the test. Then, you will drink a glucose solution that contains a certain amount of glucose. Your blood glucose will be measured again 1 and 2 hours after drinking the solution. This test takes 2 hours to complete. You will need to stay at the testing location during this time. During the testing period:  Do not eat or drink anything other than the glucose solution. You will be allowed to drink water.  Do not exercise.  Do not use any products that contain nicotine or tobacco, such as cigarettes and e-cigarettes. If you need help stopping, ask your health care provider. The testing procedure may vary among health care providers and hospitals. How are the results reported? Your results will be reported as milligrams of glucose per deciliter of blood (mg/dL) or millimoles per liter (mmol/L). Your health care provider will compare your results to normal ranges that were established after testing a large group of people (reference ranges). Reference ranges may vary among labs and hospitals. For this test, common reference ranges are:  Fasting: less than 110 mg/dL (6.1 mmol/L).  1 hour after drinking glucose: less than 180 mg/dL (10.0 mmol/L).  2 hours after drinking glucose: less than 140 mg/dL (7.8 mmol/L). What do the results mean? Results that are within the reference ranges are considered normal, meaning that your glucose levels are well controlled. Results higher than the reference ranges may mean that you recently experienced stress, such as from an injury or a sudden (acute) condition   like a heart attack or stroke, or that you have:  Diabetes.  Cushing syndrome.  Tumors such as pheochromocytoma or glucagonoma.  Kidney failure.  Pancreatitis.  Hyperthyroidism.  An infection. Talk with your health care provider about what your results mean. Questions to ask your health care provider Ask your health care provider, or the department  that is doing the test:  When will my results be ready?  How will I get my results?  What are my treatment options?  What other tests do I need?  What are my next steps? Summary  The glucose tolerance test (GTT) is done to check how your body processes sugar (glucose). This is one of several tests used to diagnose diabetes (diabetes mellitus).  This test measures the amount of glucose in your blood at different times during a period of 2 hours. This indicates how well your body is able to process glucose.  Talk with your health care provider about what your results mean. This information is not intended to replace advice given to you by your health care provider. Make sure you discuss any questions you have with your health care provider. Document Released: 08/19/2004 Document Revised: 07/09/2017 Document Reviewed: 03/08/2017 Elsevier Patient Education  2020 Elsevier Inc.    

## 2019-07-25 NOTE — Progress Notes (Signed)
Medicaid Home Form completed-07/25/2019

## 2019-07-25 NOTE — Telephone Encounter (Signed)
[place] spoke with Christy Sartorius, he stated that Pts BP Cuf was mailed & he has conf# but it looks like it's still in transit. Christy Sartorius stated will contact Pt directly.

## 2019-07-25 NOTE — Progress Notes (Signed)
Pt states has developed rash on bottom that has spread to vaginal area, has been using diaper rash cream but has gotten worse.

## 2019-07-26 ENCOUNTER — Encounter: Payer: Self-pay | Admitting: *Deleted

## 2019-07-27 LAB — AFP, SERUM, OPEN SPINA BIFIDA
AFP MoM: 0.87
AFP Value: 40.2 ng/mL
Gest. Age on Collection Date: 19.1 weeks
Maternal Age At EDD: 39.5 yr
OSBR Risk 1 IN: 10000
Test Results:: NEGATIVE
Weight: 171 [lb_av]

## 2019-07-28 DIAGNOSIS — O0990 Supervision of high risk pregnancy, unspecified, unspecified trimester: Secondary | ICD-10-CM | POA: Diagnosis not present

## 2019-08-18 DIAGNOSIS — O0990 Supervision of high risk pregnancy, unspecified, unspecified trimester: Secondary | ICD-10-CM | POA: Diagnosis not present

## 2019-08-22 ENCOUNTER — Encounter (HOSPITAL_COMMUNITY): Payer: Self-pay | Admitting: *Deleted

## 2019-08-22 ENCOUNTER — Other Ambulatory Visit: Payer: Self-pay

## 2019-08-22 ENCOUNTER — Ambulatory Visit (HOSPITAL_COMMUNITY): Payer: Medicaid Other | Admitting: *Deleted

## 2019-08-22 ENCOUNTER — Ambulatory Visit (HOSPITAL_COMMUNITY)
Admission: RE | Admit: 2019-08-22 | Discharge: 2019-08-22 | Disposition: A | Discharge status: Home / Self Care | Payer: Medicaid Other | Source: Ambulatory Visit | Attending: Obstetrics | Admitting: Obstetrics

## 2019-08-22 DIAGNOSIS — Z3A23 23 weeks gestation of pregnancy: Secondary | ICD-10-CM | POA: Diagnosis not present

## 2019-08-22 DIAGNOSIS — O34219 Maternal care for unspecified type scar from previous cesarean delivery: Secondary | ICD-10-CM

## 2019-08-22 DIAGNOSIS — O0990 Supervision of high risk pregnancy, unspecified, unspecified trimester: Secondary | ICD-10-CM | POA: Insufficient documentation

## 2019-08-22 DIAGNOSIS — O09522 Supervision of elderly multigravida, second trimester: Secondary | ICD-10-CM | POA: Diagnosis not present

## 2019-08-22 DIAGNOSIS — O09529 Supervision of elderly multigravida, unspecified trimester: Secondary | ICD-10-CM | POA: Diagnosis not present

## 2019-08-22 DIAGNOSIS — O43192 Other malformation of placenta, second trimester: Secondary | ICD-10-CM

## 2019-09-06 ENCOUNTER — Other Ambulatory Visit: Payer: Self-pay | Admitting: Lactation Services

## 2019-09-06 DIAGNOSIS — O0990 Supervision of high risk pregnancy, unspecified, unspecified trimester: Secondary | ICD-10-CM

## 2019-09-12 ENCOUNTER — Other Ambulatory Visit: Payer: Medicaid Other

## 2019-09-12 ENCOUNTER — Ambulatory Visit (INDEPENDENT_AMBULATORY_CARE_PROVIDER_SITE_OTHER): Payer: Medicaid Other | Admitting: Student

## 2019-09-12 ENCOUNTER — Other Ambulatory Visit: Payer: Self-pay

## 2019-09-12 ENCOUNTER — Other Ambulatory Visit (HOSPITAL_COMMUNITY)
Admission: RE | Admit: 2019-09-12 | Discharge: 2019-09-12 | Disposition: A | Discharge status: Home / Self Care | Payer: Medicaid Other | Source: Ambulatory Visit | Attending: Student | Admitting: Student

## 2019-09-12 VITALS — BP 132/87 | HR 102 | Wt 186.4 lb

## 2019-09-12 DIAGNOSIS — Z23 Encounter for immunization: Secondary | ICD-10-CM

## 2019-09-12 DIAGNOSIS — O0992 Supervision of high risk pregnancy, unspecified, second trimester: Secondary | ICD-10-CM

## 2019-09-12 DIAGNOSIS — O0990 Supervision of high risk pregnancy, unspecified, unspecified trimester: Secondary | ICD-10-CM

## 2019-09-12 DIAGNOSIS — N898 Other specified noninflammatory disorders of vagina: Secondary | ICD-10-CM | POA: Diagnosis not present

## 2019-09-12 DIAGNOSIS — Z3A26 26 weeks gestation of pregnancy: Secondary | ICD-10-CM | POA: Diagnosis not present

## 2019-09-12 MED ORDER — TRIAMCINOLONE ACETONIDE 0.025 % EX OINT: 1.0000 "application " | TOPICAL_OINTMENT | Freq: Two times a day (BID) | CUTANEOUS | 0 refills | Status: DC

## 2019-09-12 MED ORDER — NYSTATIN 100000 UNIT/GM EX OINT: 1.0000 "application " | TOPICAL_OINTMENT | Freq: Two times a day (BID) | CUTANEOUS | 0 refills | Status: DC

## 2019-09-12 MED ORDER — HYDROXYZINE HCL 10 MG PO TABS: 10.0000 mg | ORAL_TABLET | Freq: Three times a day (TID) | ORAL | 0 refills | Status: DC | PRN

## 2019-09-12 NOTE — Progress Notes (Signed)
Pt states did not fast.

## 2019-09-12 NOTE — Progress Notes (Signed)
Patient ID: Tanya Moore, female   DOB: 1981-05-01, 39 y.o.   MRN: [id number]   PRENATAL VISIT NOTE  Subjective:  Tanya Moore is a 39 y.o. G4P1021 at [redacted]w[redacted]d being seen today for ongoing prenatal care.  She is currently monitored for the following issues for this low-risk pregnancy and has History of ectopic pregnancy in 11/2018; Hypercholesteremia; Skin lesion of left arm; Hemoperitoneum due to rupture of right tubal ectopic pregnancy; Ectopic pregnancy with intrauterine pregnancy/Heterotopic Pregnancy; Supervision of high risk pregnancy, antepartum; and History of low transverse cesarean section on their problem list.  Patient reports that the rash is spreading. She has been trying the cream and feels like it is not working. It is itching badly. Triamcinolone cream is not working.  Contractions: Not present. Vag. Bleeding: None.  Movement: Present. Denies leaking of fluid.   The following portions of the patient's history were reviewed and updated as appropriate: allergies, current medications, past family history, past medical history, past social history, past surgical history and problem list.   Objective:   Vitals:   09/12/19 0910  BP: 132/87  Pulse: (!) 102  Weight: 186 lb 6.4 oz (84.6 kg)    Fetal Status: Fetal Heart Rate (bpm): 156 Fundal Height: 26 cm Movement: Present     General:  Alert, oriented and cooperative. Patient is in no acute distress.  Skin: Skin is warm and dry. No rash noted.   Cardiovascular: Normal heart rate noted  Respiratory: Normal respiratory effort, no problems with respiration noted  Abdomen: Soft, gravid, appropriate for gestational age.  Pain/Pressure: Present     Pelvic: Cervical exam deferred        Extremities: Normal range of motion.  Edema: Trace  Mental Status: Normal mood and affect. Normal behavior. Normal judgment and thought content.   Assessment and Plan:  Pregnancy: G4P1021 at [redacted]w[redacted]d 1. Itching in the vaginal area Consult with Dr.  Vergie Living; who recommends switching to Mycolog 2. Since Medicaid will not cover Mycolog; will order triamcinalone ointment (not cream, as patient has already tried that), and Nystatin ointment.   -Patient has been checking BPs but they are not transferring to [facility]; reports that they are consistently in 120/80s. She knows that values higher than 140/90s are cause for concern and she should call clinic - Cervicovaginal ancillary only( [place])  Preterm labor symptoms and general obstetric precautions including but not limited to vaginal bleeding, contractions, leaking of fluid and fetal movement were reviewed in detail with the patient. Please refer to After Visit Summary for other counseling recommendations.   Return in about 10 days (around 09/22/2019), or come back in ten days in person with MD to check rash plus 2 hour GTT plus LROB virtual in 4 weeks.  No future appointments.  Marylene Land, CNM

## 2019-09-12 NOTE — Patient Instructions (Signed)
Glucose Tolerance Test °Why am I having this test? °The glucose tolerance test (GTT) is done to check how your body processes sugar (glucose). This is one of several tests used to diagnose diabetes (diabetes mellitus). °Your health care provider may recommend this test if you: °· Have a family history of diabetes. °· Are very overweight (obese). °· Have infections that keep coming back (recurring). °· Have had a lot of wounds that did not heal quickly, especially on your legs and feet. °· Are a woman and have a history of giving birth to very large babies or a history of repeated fetal loss (stillbirth). °· Have had high glucose levels in your urine or blood: °? During a past pregnancy. °? After a heart attack, surgery, or prolonged periods of high stress. °What is being tested? °This test measures the amount of glucose in your blood at different times during a period of 2 hours. This indicates how well your body is able to process glucose. °What kind of sample is taken? ° °Blood samples are required for this test. They are usually collected by inserting a needle into a blood vessel. °How do I prepare for this test? °· For 3 days before your test, eat normally. Have plenty of carbohydrate-rich foods. °· Follow instructions from your health care provider about: °? Eating or drinking restrictions on the day of the test. You may be asked to not eat or drink anything other than water (fast) starting 8-12 hours before the test. °? Changing or stopping your regular medicines. Some medicines may interfere with this test. °Tell a health care provider about: °· All medicines you are taking, including vitamins, herbs, eye drops, creams, and over-the-counter medicines. °· Any blood disorders you have. °· Any surgeries you have had. °· Any medical conditions you have. °· Whether you are pregnant or may be pregnant. °What happens during the test? °First, your blood glucose will be measured. This is referred to as your fasting  blood glucose, since you fasted before the test. Then, you will drink a glucose solution that contains a certain amount of glucose. Your blood glucose will be measured again 1 and 2 hours after drinking the solution. °This test takes 2 hours to complete. You will need to stay at the testing location during this time. During the testing period: °· Do not eat or drink anything other than the glucose solution. You will be allowed to drink water. °· Do not exercise. °· Do not use any products that contain nicotine or tobacco, such as cigarettes and e-cigarettes. If you need help stopping, ask your health care provider. °The testing procedure may vary among health care providers and hospitals. °How are the results reported? °Your results will be reported as milligrams of glucose per deciliter of blood (mg/dL) or millimoles per liter (mmol/L). Your health care provider will compare your results to normal ranges that were established after testing a large group of people (reference ranges). Reference ranges may vary among labs and hospitals. For this test, common reference ranges are: °· Fasting: less than 110 mg/dL (6.1 mmol/L). °· 1 hour after drinking glucose: less than 180 mg/dL (10.0 mmol/L). °· 2 hours after drinking glucose: less than 140 mg/dL (7.8 mmol/L). °What do the results mean? °Results that are within the reference ranges are considered normal, meaning that your glucose levels are well controlled. Results higher than the reference ranges may mean that you recently experienced stress, such as from an injury or a sudden (acute) condition like a   heart attack or stroke, or that you have: °· Diabetes. °· Cushing syndrome. °· Tumors such as pheochromocytoma or glucagonoma. °· Kidney failure. °· Pancreatitis. °· Hyperthyroidism. °· An infection. °Talk with your health care provider about what your results mean. °Questions to ask your health care provider °Ask your health care provider, or the department that is  doing the test: °· When will my results be ready? °· How will I get my results? °· What are my treatment options? °· What other tests do I need? °· What are my next steps? °Summary °· The glucose tolerance test (GTT) is done to check how your body processes sugar (glucose). This is one of several tests used to diagnose diabetes (diabetes mellitus). °· This test measures the amount of glucose in your blood at different times during a period of 2 hours. This indicates how well your body is able to process glucose. °· Talk with your health care provider about what your results mean. °This information is not intended to replace advice given to you by your health care provider. Make sure you discuss any questions you have with your health care provider. °Document Revised: 07/09/2017 Document Reviewed: 03/08/2017 °Elsevier Patient Education © 2020 Elsevier Inc. ° °

## 2019-09-12 NOTE — Addendum Note (Signed)
Addended by: Henrietta Dine on: 09/12/2019 11:01 AM   Modules accepted: Orders

## 2019-09-13 LAB — CERVICOVAGINAL ANCILLARY ONLY
Bacterial Vaginitis (gardnerella): NEGATIVE
Candida Glabrata: NEGATIVE
Candida Vaginitis: NEGATIVE
Chlamydia: NEGATIVE
Comment: NEGATIVE
Comment: NEGATIVE
Comment: NEGATIVE
Comment: NEGATIVE
Comment: NEGATIVE
Comment: NORMAL
Neisseria Gonorrhea: NEGATIVE
Trichomonas: NEGATIVE

## 2019-09-26 ENCOUNTER — Ambulatory Visit (INDEPENDENT_AMBULATORY_CARE_PROVIDER_SITE_OTHER): Payer: Medicaid Other | Admitting: Obstetrics and Gynecology

## 2019-09-26 ENCOUNTER — Other Ambulatory Visit: Payer: Medicaid Other

## 2019-09-26 ENCOUNTER — Encounter: Payer: Self-pay | Admitting: Obstetrics and Gynecology

## 2019-09-26 ENCOUNTER — Other Ambulatory Visit: Payer: Self-pay

## 2019-09-26 VITALS — BP 133/85 | HR 97 | Wt 187.0 lb

## 2019-09-26 DIAGNOSIS — Z98891 History of uterine scar from previous surgery: Secondary | ICD-10-CM

## 2019-09-26 DIAGNOSIS — O0990 Supervision of high risk pregnancy, unspecified, unspecified trimester: Secondary | ICD-10-CM

## 2019-09-26 DIAGNOSIS — R21 Rash and other nonspecific skin eruption: Secondary | ICD-10-CM

## 2019-09-26 DIAGNOSIS — Z3A28 28 weeks gestation of pregnancy: Secondary | ICD-10-CM

## 2019-09-26 DIAGNOSIS — O0993 Supervision of high risk pregnancy, unspecified, third trimester: Secondary | ICD-10-CM

## 2019-09-26 NOTE — Progress Notes (Signed)
   PRENATAL VISIT NOTE  Subjective:  Tanya Moore is a 39 y.o. G4P1021 at [redacted]w[redacted]d being seen today for ongoing prenatal care.  She is currently monitored for the following issues for this low-risk pregnancy and has History of ectopic pregnancy in 11/2018; Hypercholesteremia; Skin lesion of left arm; Hemoperitoneum due to rupture of right tubal ectopic pregnancy; Ectopic pregnancy with intrauterine pregnancy/Heterotopic Pregnancy; Supervision of high risk pregnancy, antepartum; and History of low transverse cesarean section on their problem list.  Patient reports persistent presence of a pruritic rash.  Contractions: Not present. Vag. Bleeding: None.  Movement: Present. Denies leaking of fluid.   The following portions of the patient's history were reviewed and updated as appropriate: allergies, current medications, past family history, past medical history, past social history, past surgical history and problem list.   Objective:   Vitals:   09/26/19 0828  BP: 133/85  Pulse: 97  Weight: 187 lb (84.8 kg)    Fetal Status: Fetal Heart Rate (bpm): 147 Fundal Height: 28 cm Movement: Present     General:  Alert, oriented and cooperative. Patient is in no acute distress.  Skin: Skin is warm and dry. No rash noted.   Cardiovascular: Normal heart rate noted  Respiratory: Normal respiratory effort, no problems with respiration noted  Abdomen: Soft, gravid, appropriate for gestational age.  Pain/Pressure: Present     Pelvic: Cervical exam deferred        Extremities: Normal range of motion.  Edema: Trace  Mental Status: Normal mood and affect. Normal behavior. Normal judgment and thought content.   Assessment and Plan:  Pregnancy: G4P1021 at [redacted]w[redacted]d 1. Supervision of high risk pregnancy, antepartum Patient is doing well Third trimester labs with glucola today Will refer to dermatology for rash. Will also check bile acids - Bile acids, total  2. History of low transverse cesarean  section Patient desires elective repeat with pp IUD  3. Skin rash  - Ambulatory referral to Dermatology  Preterm labor symptoms and general obstetric precautions including but not limited to vaginal bleeding, contractions, leaking of fluid and fetal movement were reviewed in detail with the patient. Please refer to After Visit Summary for other counseling recommendations.   Return in about 2 weeks (around 10/10/2019) for Virtual, ROB, Low risk.  Future Appointments  Date Time Provider Department Center  09/26/2019  9:15 AM Tnya Ades, Gigi Gin, MD [facility]  10/10/2019 10:15 AM Marny Lowenstein, PA-C [facility]    Catalina Antigua, MD

## 2019-09-26 NOTE — Progress Notes (Signed)
Rash has not gone away.

## 2019-09-27 LAB — CBC
Hematocrit: 32.8 % — ABNORMAL LOW (ref 34.0–46.6)
Hemoglobin: 11.5 g/dL (ref 11.1–15.9)
MCH: 30.3 pg (ref 26.6–33.0)
MCHC: 35.1 g/dL (ref 31.5–35.7)
MCV: 87 fL (ref 79–97)
Platelets: 258 10*3/uL (ref 150–450)
RBC: 3.79 x10E6/uL (ref 3.77–5.28)
RDW: 12.8 % (ref 11.7–15.4)
WBC: 11.4 10*3/uL — ABNORMAL HIGH (ref 3.4–10.8)

## 2019-09-27 LAB — GLUCOSE TOLERANCE, 2 HOURS W/ 1HR
Glucose, 1 hour: 144 mg/dL (ref 65–179)
Glucose, 2 hour: 109 mg/dL (ref 65–152)
Glucose, Fasting: 78 mg/dL (ref 65–91)

## 2019-09-27 LAB — RPR: RPR Ser Ql: NONREACTIVE

## 2019-09-27 LAB — HIV ANTIBODY (ROUTINE TESTING W REFLEX): HIV Screen 4th Generation wRfx: NONREACTIVE

## 2019-09-27 LAB — BILE ACIDS, TOTAL: Bile Acids Total: 2.6 umol/L (ref 0.0–10.0)

## 2019-10-03 DIAGNOSIS — L400 Psoriasis vulgaris: Secondary | ICD-10-CM | POA: Diagnosis not present

## 2019-10-03 DIAGNOSIS — D225 Melanocytic nevi of trunk: Secondary | ICD-10-CM | POA: Diagnosis not present

## 2019-10-03 DIAGNOSIS — L304 Erythema intertrigo: Secondary | ICD-10-CM | POA: Diagnosis not present

## 2019-10-10 ENCOUNTER — Encounter: Payer: Self-pay | Admitting: Medical

## 2019-10-10 ENCOUNTER — Telehealth (INDEPENDENT_AMBULATORY_CARE_PROVIDER_SITE_OTHER): Payer: Medicaid Other | Admitting: Medical

## 2019-10-10 ENCOUNTER — Other Ambulatory Visit: Payer: Self-pay

## 2019-10-10 VITALS — BP 136/89 | HR 95

## 2019-10-10 DIAGNOSIS — O0990 Supervision of high risk pregnancy, unspecified, unspecified trimester: Secondary | ICD-10-CM

## 2019-10-10 DIAGNOSIS — Z3A30 30 weeks gestation of pregnancy: Secondary | ICD-10-CM

## 2019-10-10 DIAGNOSIS — Z98891 History of uterine scar from previous surgery: Secondary | ICD-10-CM

## 2019-10-10 DIAGNOSIS — O0993 Supervision of high risk pregnancy, unspecified, third trimester: Secondary | ICD-10-CM

## 2019-10-10 NOTE — Patient Instructions (Addendum)
Fetal Movement Counts Patient Name: ________________________________________________ Patient Due Date: ____________________ What is a fetal movement count?  A fetal movement count is the number of times that you feel your baby move during a certain amount of time. This may also be called a fetal kick count. A fetal movement count is recommended for every pregnant woman. You may be asked to start counting fetal movements as early as week 28 of your pregnancy. Pay attention to when your baby is most active. You may notice your baby's sleep and wake cycles. You may also notice things that make your baby move more. You should do a fetal movement count:  When your baby is normally most active.  At the same time each day. A good time to count movements is while you are resting, after having something to eat and drink. How do I count fetal movements? 1. Find a quiet, comfortable area. Sit, or lie down on your side. 2. Write down the date, the start time and stop time, and the number of movements that you felt between those two times. Take this information with you to your health care visits. 3. Write down your start time when you feel the first movement. 4. Count kicks, flutters, swishes, rolls, and jabs. You should feel at least 10 movements. 5. You may stop counting after you have felt 10 movements, or if you have been counting for 2 hours. Write down the stop time. 6. If you do not feel 10 movements in 2 hours, contact your health care provider for further instructions. Your health care provider may want to do additional tests to assess your baby's well-being. Contact a health care provider if:  You feel fewer than 10 movements in 2 hours.  Your baby is not moving like he or she usually does. Date: ____________ Start time: ____________ Stop time: ____________ Movements: ____________ Date: ____________ Start time: ____________ Stop time: ____________ Movements: ____________ Date: ____________  Start time: ____________ Stop time: ____________ Movements: ____________ Date: ____________ Start time: ____________ Stop time: ____________ Movements: ____________ Date: ____________ Start time: ____________ Stop time: ____________ Movements: ____________ Date: ____________ Start time: ____________ Stop time: ____________ Movements: ____________ Date: ____________ Start time: ____________ Stop time: ____________ Movements: ____________ Date: ____________ Start time: ____________ Stop time: ____________ Movements: ____________ Date: ____________ Start time: ____________ Stop time: ____________ Movements: ____________ This information is not intended to replace advice given to you by your health care provider. Make sure you discuss any questions you have with your health care provider. Document Revised: 03/16/2019 Document Reviewed: 03/16/2019 Elsevier Patient Education  2020 [employer] Braxton Hicks Contractions Contractions of the uterus can occur throughout pregnancy, but they are not always a sign that you are in labor. You may have practice contractions called Braxton Hicks contractions. These false labor contractions are sometimes confused with true labor. What are Braxton Hicks contractions? Braxton Hicks contractions are tightening movements that occur in the muscles of the uterus before labor. Unlike true labor contractions, these contractions do not result in opening (dilation) and thinning of the cervix. Toward the end of pregnancy (32-34 weeks), Braxton Hicks contractions can happen more often and may become stronger. These contractions are sometimes difficult to tell apart from true labor because they can be very uncomfortable. You should not feel embarrassed if you go to the hospital with false labor. Sometimes, the only way to tell if you are in true labor is for your health care provider to look for changes in the cervix. The health care provider   will do a physical exam and may  monitor your contractions. If you are not in true labor, the exam should show that your cervix is not dilating and your water has not broken. If there are no other health problems associated with your pregnancy, it is completely safe for you to be sent home with false labor. You may continue to have Braxton Hicks contractions until you go into true labor. How to tell the difference between true labor and false labor True labor  Contractions last 30-70 seconds.  Contractions become very regular.  Discomfort is usually felt in the top of the uterus, and it spreads to the lower abdomen and low back.  Contractions do not go away with walking.  Contractions usually become more intense and increase in frequency.  The cervix dilates and gets thinner. False labor  Contractions are usually shorter and not as strong as true labor contractions.  Contractions are usually irregular.  Contractions are often felt in the front of the lower abdomen and in the groin.  Contractions may go away when you walk around or change positions while lying down.  Contractions get weaker and are shorter-lasting as time goes on.  The cervix usually does not dilate or become thin. Follow these instructions at home:   Take over-the-counter and prescription medicines only as told by your health care provider.  Keep up with your usual exercises and follow other instructions from your health care provider.  Eat and drink lightly if you think you are going into labor.  If Braxton Hicks contractions are making you uncomfortable: ? Change your position from lying down or resting to walking, or change from walking to resting. ? Sit and rest in a tub of warm water. ? Drink enough fluid to keep your urine pale yellow. Dehydration may cause these contractions. ? Do slow and deep breathing several times an hour.  Keep all follow-up prenatal visits as told by your health care provider. This is important. Contact a  health care provider if:  You have a fever.  You have continuous pain in your abdomen. Get help right away if:  Your contractions become stronger, more regular, and closer together.  You have fluid leaking or gushing from your vagina.  You pass blood-tinged mucus (bloody show).  You have bleeding from your vagina.  You have low back pain that you never had before.  You feel your baby's head pushing down and causing pelvic pressure.  Your baby is not moving inside you as much as it used to. Summary  Contractions that occur before labor are called Braxton Hicks contractions, false labor, or practice contractions.  Braxton Hicks contractions are usually shorter, weaker, farther apart, and less regular than true labor contractions. True labor contractions usually become progressively stronger and regular, and they become more frequent.  Manage discomfort from Braxton Hicks contractions by changing position, resting in a warm bath, drinking plenty of water, or practicing deep breathing. This information is not intended to replace advice given to you by your health care provider. Make sure you discuss any questions you have with your health care provider. Document Revised: 07/09/2017 Document Reviewed: 12/10/2016 Elsevier Patient Education  2020 [employer]  

## 2019-10-10 NOTE — Progress Notes (Signed)
I connected with Francena Zender Biernat on 10/10/19 at 10:15 AM EST by: MyChart and verified that I am speaking with the correct person using two identifiers.  Patient is located at home and provider is located at [facility].     The purpose of this virtual visit is to provide medical care while limiting exposure to the novel coronavirus. I discussed the limitations, risks, security and privacy concerns of performing an evaluation and management service by MyChart and the availability of in person appointments. I also discussed with the patient that there may be a patient responsible charge related to this service. By engaging in this virtual visit, you consent to the provision of healthcare.  Additionally, you authorize for your insurance to be billed for the services provided during this visit.  The patient expressed understanding and agreed to proceed.  The following staff members participated in the virtual visit:  Virgina Evener, CMA    PRENATAL VISIT NOTE  Subjective:  Tanya Moore is a 39 y.o. [id number] at [redacted]w[redacted]d  for phone visit for ongoing prenatal care.  She is currently monitored for the following issues for this high-risk pregnancy and has History of ectopic pregnancy in 11/2018; Hypercholesteremia; Skin lesion of left arm; Hemoperitoneum due to rupture of right tubal ectopic pregnancy; Ectopic pregnancy with intrauterine pregnancy/Heterotopic Pregnancy; Supervision of high risk pregnancy, antepartum; and History of low transverse cesarean section on their problem list.  Patient reports no complaints.  Contractions: Not present. Vag. Bleeding: None.  Movement: Present. Denies leaking of fluid.   The following portions of the patient's history were reviewed and updated as appropriate: allergies, current medications, past family history, past medical history, past social history, past surgical history and problem list.   Objective:   Vitals:   10/10/19 1013  BP: 136/89  Pulse: 95    Self-Obtained  Fetal Status:     Movement: Present     Assessment and Plan:  Pregnancy: G4P1021 at [redacted]w[redacted]d 1. Supervision of high risk pregnancy, antepartum - Doing well, normal labs from last visit reviewed  - Noting pelvic pressure a few times/day - Discussed use of abdominal binder PRN  - Saw dermatology for rash/itching on vulva and buttocks. Diagnosed with psoriasis and yeast. Treated by Derm and improving.   2. History of low transverse cesarean section - Need to schedule repeat  - Information sent to surgical scheduler   Preterm labor symptoms and general obstetric precautions including but not limited to vaginal bleeding, contractions, leaking of fluid and fetal movement were reviewed in detail with the patient.  Return in about 2 weeks (around 10/24/2019) for LOB, Virtual.  No future appointments.   Time spent on virtual visit: 8 minutes  Vonzella Nipple, PA-C

## 2019-10-10 NOTE — Progress Notes (Signed)
I connected with  RAYNA BRENNER on 10/10/19 at 10:15 AM EST by telephone and verified that I am speaking with the correct person using two identifiers.   I discussed the limitations, risks, security and privacy concerns of performing an evaluation and management service by telephone and the availability of in person appointments. I also discussed with the patient that there may be a patient responsible charge related to this service. The patient expressed understanding and agreed to proceed.  Janene Madeira Lorilei Horan, CMA 10/10/2019  10:11 AM

## 2019-10-19 DIAGNOSIS — L304 Erythema intertrigo: Secondary | ICD-10-CM | POA: Diagnosis not present

## 2019-10-19 DIAGNOSIS — L700 Acne vulgaris: Secondary | ICD-10-CM | POA: Diagnosis not present

## 2019-10-24 ENCOUNTER — Telehealth (INDEPENDENT_AMBULATORY_CARE_PROVIDER_SITE_OTHER): Payer: Medicaid Other | Admitting: Women's Health

## 2019-10-24 VITALS — BP 136/87 | HR 99

## 2019-10-24 DIAGNOSIS — Z98891 History of uterine scar from previous surgery: Secondary | ICD-10-CM

## 2019-10-24 DIAGNOSIS — O34219 Maternal care for unspecified type scar from previous cesarean delivery: Secondary | ICD-10-CM

## 2019-10-24 DIAGNOSIS — G43809 Other migraine, not intractable, without status migrainosus: Secondary | ICD-10-CM | POA: Diagnosis not present

## 2019-10-24 DIAGNOSIS — Z3A32 32 weeks gestation of pregnancy: Secondary | ICD-10-CM

## 2019-10-24 DIAGNOSIS — O0990 Supervision of high risk pregnancy, unspecified, unspecified trimester: Secondary | ICD-10-CM

## 2019-10-24 NOTE — Patient Instructions (Addendum)
Maternity Assessment Unit (MAU)  The Maternity Assessment Unit (MAU) is located at the [facility]. The address is: [address], [address], [place], [place] [id number]. Please see map below for additional directions.    The Maternity Assessment Unit is designed to help you during your pregnancy, and for up to 6 weeks after delivery, with any pregnancy- or postpartum-related emergencies, if you think you are in labor, or if your water has broken. For example, if you experience nausea and vomiting, vaginal bleeding, severe abdominal or pelvic pain, elevated blood pressure or other problems related to your pregnancy or postpartum time, please come to the Maternity Assessment Unit for assistance.    Preeclampsia and Eclampsia Preeclampsia is a serious condition that may develop during pregnancy. This condition causes high blood pressure and increased protein in your urine along with other symptoms, such as headaches and vision changes. These symptoms may develop as the condition gets worse. Preeclampsia may occur at 20 weeks of pregnancy or later. Diagnosing and treating preeclampsia early is very important. If not treated early, it can cause serious problems for you and your baby. One problem it can lead to is eclampsia. Eclampsia is a condition that causes muscle jerking or shaking (convulsions or seizures) and other serious problems for the mother. During pregnancy, delivering your baby may be the best treatment for preeclampsia or eclampsia. For most women, preeclampsia and eclampsia symptoms go away after giving birth. In rare cases, a woman may develop preeclampsia after giving birth (postpartum preeclampsia). This usually occurs within 48 hours after childbirth but may occur up to 6 weeks after giving birth. What are the causes? The cause of preeclampsia is not known. What increases the risk? The following risk factors make you more likely to  develop preeclampsia:  Being pregnant for the first time.  Having had preeclampsia during a past pregnancy.  Having a family history of preeclampsia.  Having high blood pressure.  Being pregnant with more than one baby.  Being 3735 or older.  Being African-American.  Having kidney disease or diabetes.  Having medical conditions such as lupus or blood diseases.  Being very overweight (obese). What are the signs or symptoms? The most common symptoms are:  Severe headaches.  Vision problems, such as blurred or double vision.  Abdominal pain, especially upper abdominal pain. Other symptoms that may develop as the condition gets worse include:  Sudden weight gain.  Sudden swelling of the hands, face, legs, and feet.  Severe nausea and vomiting.  Numbness in the face, arms, legs, and feet.  Dizziness.  Urinating less than usual.  Slurred speech.  Convulsions or seizures. How is this diagnosed? There are no screening tests for preeclampsia. Your health care provider will ask you about symptoms and check for signs of preeclampsia during your prenatal visits. You may also have tests that include:  Checking your blood pressure.  Urine tests to check for protein. Your health care provider will check for this at every prenatal visit.  Blood tests.  Monitoring your baby's heart rate.  Ultrasound. How is this treated? You and your health care provider will determine the treatment approach that is best for you. Treatment may include:  Having more frequent prenatal exams to check for signs of preeclampsia, if you have an increased risk for preeclampsia.  Medicine to lower your blood pressure.  Staying in the hospital, if your condition is severe. There, treatment will focus on controlling your blood pressure and  the amount of fluids in your body (fluid retention).  Taking medicine (magnesium sulfate) to prevent seizures. This may be given as an injection or through an  IV.  Taking a low-dose aspirin during your pregnancy.  Delivering your baby early. You may have your labor started with medicine (induced), or you may have a cesarean delivery. Follow these instructions at home: Eating and drinking   Drink enough fluid to keep your urine pale yellow.  Avoid caffeine. Lifestyle  Do not use any products that contain nicotine or tobacco, such as cigarettes and e-cigarettes. If you need help quitting, ask your health care provider.  Do not use alcohol or drugs.  Avoid stress as much as possible. Rest and get plenty of sleep. General instructions  Take over-the-counter and prescription medicines only as told by your health care provider.  When lying down, lie on your left side. This keeps pressure off your major blood vessels.  When sitting or lying down, raise (elevate) your feet. Try putting some pillows underneath your lower legs.  Exercise regularly. Ask your health care provider what kinds of exercise are best for you.  Keep all follow-up and prenatal visits as told by your health care provider. This is important. How is this prevented? There is no known way of preventing preeclampsia or eclampsia from developing. However, to lower your risk of complications and detect problems early:  Get regular prenatal care. Your health care provider may be able to diagnose and treat the condition early.  Maintain a healthy weight. Ask your health care provider for help managing weight gain during pregnancy.  Work with your health care provider to manage any long-term (chronic) health conditions you have, such as diabetes or kidney problems.  You may have tests of your blood pressure and kidney function after giving birth.  Your health care provider may have you take low-dose aspirin during your next pregnancy. Contact a health care provider if:  You have symptoms that your health care provider told you may require more treatment or monitoring, such  as: ? Headaches. ? Nausea or vomiting. ? Abdominal pain. ? Dizziness. ? Light-headedness. Get help right away if:  You have severe: ? Abdominal pain. ? Headaches that do not get better. ? Dizziness. ? Vision problems. ? Confusion. ? Nausea or vomiting.  You have any of the following: ? A seizure. ? Sudden, rapid weight gain. ? Sudden swelling in your hands, ankles, or face. ? Trouble moving any part of your body. ? Numbness in any part of your body. ? Trouble speaking. ? Abnormal bleeding.  You faint. Summary  Preeclampsia is a serious condition that may develop during pregnancy.  This condition causes high blood pressure and increased protein in your urine along with other symptoms, such as headaches and vision changes.  Diagnosing and treating preeclampsia early is very important. If not treated early, it can cause serious problems for you and your baby.  Get help right away if you have symptoms that your health care provider told you to watch for. This information is not intended to replace advice given to you by your health care provider. Make sure you discuss any questions you have with your health care provider. Document Revised: 03/29/2018 Document Reviewed: 03/02/2016 Elsevier Patient Education  2020 [employer].    Preterm Labor and Birth Information  The normal length of a pregnancy is 39-41 weeks. Preterm labor is when labor starts before 37 completed weeks of pregnancy. What are the risk factors for preterm  labor? Preterm labor is more likely to occur in women who:  Have certain infections during pregnancy such as a bladder infection, sexually transmitted infection, or infection inside the uterus (chorioamnionitis).  Have a shorter-than-normal cervix.  Have gone into preterm labor before.  Have had surgery on their cervix.  Are younger than age 34 or older than age 66.  Are African American.  Are pregnant with twins or multiple babies (multiple  gestation).  Take street drugs or smoke while pregnant.  Do not gain enough weight while pregnant.  Became pregnant shortly after having been pregnant. What are the symptoms of preterm labor? Symptoms of preterm labor include:  Cramps similar to those that can happen during a menstrual period. The cramps may happen with diarrhea.  Pain in the abdomen or lower back.  Regular uterine contractions that may feel like tightening of the abdomen.  A feeling of increased pressure in the pelvis.  Increased watery or bloody mucus discharge from the vagina.  Water breaking (ruptured amniotic sac). Why is it important to recognize signs of preterm labor? It is important to recognize signs of preterm labor because babies who are born prematurely may not be fully developed. This can put them at an increased risk for:  Long-term (chronic) heart and lung problems.  Difficulty immediately after birth with regulating body systems, including blood sugar, body temperature, heart rate, and breathing rate.  Bleeding in the brain.  Cerebral palsy.  Learning difficulties.  Death. These risks are highest for babies who are born before 58 weeks of pregnancy. How is preterm labor treated? Treatment depends on the length of your pregnancy, your condition, and the health of your baby. It may involve:  Having a stitch (suture) placed in your cervix to prevent your cervix from opening too early (cerclage).  Taking or being given medicines, such as: ? Hormone medicines. These may be given early in pregnancy to help support the pregnancy. ? Medicine to stop contractions. ? Medicines to help mature the baby's lungs. These may be prescribed if the risk of delivery is high. ? Medicines to prevent your baby from developing cerebral palsy. If the labor happens before 34 weeks of pregnancy, you may need to stay in the hospital. What should I do if I think I am in preterm labor? If you think that you are  going into preterm labor, call your health care provider right away. How can I prevent preterm labor in future pregnancies? To increase your chance of having a full-term pregnancy:  Do not use any tobacco products, such as cigarettes, chewing tobacco, and e-cigarettes. If you need help quitting, ask your health care provider.  Do not use street drugs or medicines that have not been prescribed to you during your pregnancy.  Talk with your health care provider before taking any herbal supplements, even if you have been taking them regularly.  Make sure you gain a healthy amount of weight during your pregnancy.  Watch for infection. If you think that you might have an infection, get it checked right away.  Make sure to tell your health care provider if you have gone into preterm labor before. This information is not intended to replace advice given to you by your health care provider. Make sure you discuss any questions you have with your health care provider. Document Revised: 11/18/2018 Document Reviewed: 12/18/2015 Elsevier Patient Education  2020 [employer].   Pregnancy and COVID-19 Coronavirus disease, also called COVID-19, is an infection of the lungs and  airways (respiratory tract). It is unclear at this time if pregnancy makes it more likely for you to get COVID-19, or what effects the infection may have on your unborn baby. However, pregnancy causes changes to your heart, lungs, and your body's disease-fighting system (immune system). Some of these changes make it more likely for you to get sick and have more serious illness. Therefore, it is important for you to take precautions in order to protect yourself and your unborn baby. There have been studies showing that obesity and diabetes may put you at higher risk for serious illness. If you are pregnant and are obese or have diabetes, you should take extra precautions to protect yourself from the virus. Work with your health care team  to develop a plan to protect yourself from all infections, including COVID-19. This is one way for you to stay healthy during your pregnancy and to keep your baby healthy as well. How does this affect me? If you get COVID-19, there is a risk that you may:  Get a respiratory illness that can lead to pneumonia.  Give birth to your baby before 37 weeks of pregnancy (premature birth). If you have or may have COVID-19, your health care provider may recommend special precautions around your pregnancy. This may affect how you:  Receive care before delivery (prenatal care). How you visit your health care provider may change. Tests and scans may need to be performed differently.  Receive care during labor and delivery. This may affect your birth plan, including who may be with you during labor and delivery.  Receive care after you deliver your baby (postpartum care). You may stay longer in the hospital and in a special room.  Feed your baby after he or she is born. Pregnancy can be an especially stressful time because of the changes in your body and the preparation involved in becoming a parent. In addition, you may be feeling especially fearful, anxious, or stressed because of COVID-19 and how it is affecting you. How does this affect my baby? It is not known whether a mother will transmit the virus to her unborn baby. There is a risk that if you get COVID-19:  The virus that causes COVID-19 can pass to your baby.  You may have premature birth. Your baby may require more medical care if this happens. What can I do to lower my risk?  There is no vaccine to help prevent COVID-19. However, there are actions that you can take to protect yourself and others from this virus. Cleaning and personal hygiene  Wash your hands often with soap and water for at least 20 seconds. If soap and water are not available, use alcohol-based hand sanitizer.  Avoid touching your mouth, face, eyes, or nose.  Clean  and disinfect objects and surfaces that are frequently touched every day. These may include: ? Counters and tables. ? Doorknobs and light switches. ? Sinks and faucets. ? Electronics such as phones, remote controls, keyboards, computers, and tablets. Stay away from others  Stay away from people who are sick, if possible.  Avoid social gatherings and travel.  Stay home as much as possible. Follow these instructions: Breastfeeding It is not known if the virus that causes COVID-19 can pass through breast milk to your baby. You should make a plan for feeding your infant with your family and your health care team. If you have or may have COVID-19, your health care provider may recommend that you take precautions while breastfeeding, such as:  Washing your hands before feeding your baby.  Wearing a mask while feeding your baby.  Pumping or expressing breast milk to feed to your baby. If possible, ask someone in your household who is not sick to feed your baby the expressed breast milk. ? Wash your hands before touching pump parts. ? Wash and disinfect all pump parts after expressing milk. Follow the manufacturer's instructions to clean and disinfect all pump parts. General instructions  If you think you have a COVID-19 infection, contact your health care provider right away. Tell your health care provider that you think you may have a COVID-19 infection.  Follow your health care provider's instructions on taking medicines. Some medicines may be unsafe to take during pregnancy.  Cover your mouth and nose by wearing a mask or other cloth covering over your face when you go out in public.  Find ways to manage stress. These may include: ? Using relaxation techniques like meditation and deep breathing. ? Getting regular exercise. Most women can continue their usual exercise routine during pregnancy. Ask your health care provider what activities are safe for you. ? Seeking support from family,  friends, or spiritual resources. If you cannot be together in person, you can still connect by phone calls, texts, video calls, or online messaging. ? Spending time doing relaxing activities that you enjoy, like listening to music or reading a good book.  Ask for help if you have counseling or nutritional needs during pregnancy. Your health care provider can offer advice or refer you to resources or specialists who can help you with various needs.  Keep all follow-up visits as told by your health care provider. This is important. Where to find more information Centers for Disease Control and Prevention (CDC): AffordableShare.com.br World Health Organization [facility]): PokerPortraits.es [employer] of [employer]ians and Gynecologists (ACOG): BuyDucts.dk Questions to ask your health care team  What should I do if I have COVID-19 symptoms?  How will COVID-19 affect my prenatal care visits, tests and scans, labor and delivery, and postpartum care?  Should I plan to breastfeed my baby?  Where can I find mental health resources?  Where can I find support if I have financial concerns? Contact a health care provider if:  You have signs and symptoms of infection, including a fever or cough. Tell your health care team that you think you may have a COVID-19 infection.  You have strong emotions, such as sadness or anxiety.  You feel unsafe in your home and need help finding a safe place to live.  You have bloody or watery vaginal discharge or vaginal bleeding. Get help right away if:  You have signs or symptoms of labor before 37 weeks of pregnancy. These include: ? Contractions that are 5 minutes or less apart, or that increase in frequency, intensity, or length. ? Sudden, sharp pain in the abdomen or in the lower back. ? A gush or trickle  of fluid from your vagina.  You have signs of more serious illness such as: ? You have difficulty breathing. ? You have chest pain. ? You have a fever greater than 102F (39C) or higher that does not go away. ? You cannot drink fluids without vomiting. ? You feel extremely weak or you faint. These symptoms may represent a serious problem that is an emergency. Do not wait to see if the symptoms will go away. Get medical help right away. Call your local emergency services (911 in the [place] Do not drive yourself to the hospital.  Summary  Coronavirus disease, also called COVID-19, is an infection of the lungs and airways (respiratory tract). It is unclear at this time if pregnancy makes you more susceptible to COVID-19 and what effects it may have on unborn babies.  It is important to take precautions to protect yourself and your developing baby. This includes washing your hands often, avoiding touching your mouth, face, eyes, or nose, avoiding social gatherings and travel, and staying away from people who are sick.  If you think you have a COVID-19 infection, contact your health care provider right away. Tell your health care provider that you think you may have a COVID-19 infection.  If you have or may have COVID-19, your health care provider may recommend special precautions during your pregnancy, labor and delivery, and after your baby is born. This information is not intended to replace advice given to you by your health care provider. Make sure you discuss any questions you have with your health care provider. Document Revised: 05/19/2019 Document Reviewed: 11/22/2018 Elsevier Patient Education  2020 [employer].                    Safe Medications in Pregnancy    Acne: Benzoyl Peroxide Salicylic Acid  Backache/Headache: Tylenol: 2 regular strength every 4 hours OR              2 Extra strength every 6 hours  Colds/Coughs/Allergies: Benadryl (alcohol free) 25 mg every 6 hours  as needed Breath right strips Claritin Cepacol throat lozenges Chloraseptic throat spray Cold-Eeze- up to three times per day Cough drops, alcohol free Flonase (by prescription only) Guaifenesin Mucinex Robitussin DM (plain only, alcohol free) Saline nasal spray/drops Sudafed (pseudoephedrine) & Actifed ** use only after [redacted] weeks gestation and if you do not have high blood pressure Tylenol Vicks Vaporub Zinc lozenges Zyrtec   Constipation: Colace Ducolax suppositories Fleet enema Glycerin suppositories Metamucil Milk of magnesia Miralax Senokot Smooth move tea  Diarrhea: Kaopectate Imodium A-D  *NO pepto Bismol  Hemorrhoids: Anusol Anusol HC Preparation H Tucks  Indigestion: Tums Maalox Mylanta Zantac  Pepcid  Insomnia: Benadryl (alcohol free)  every 6 hours as needed Tylenol PM Unisom, no Gelcaps  Leg Cramps: Tums MagGel  Nausea/Vomiting:  Bonine Dramamine Emetrol Ginger extract Sea bands Meclizine  Nausea medication to take during pregnancy:  Unisom (doxylamine succinate 25 mg tablets) Take one tablet daily at bedtime. If symptoms are not adequately controlled, the dose can be increased to a maximum recommended dose of two tablets daily (1/2 tablet in the morning, 1/2 tablet mid-afternoon and one at bedtime). Vitamin B6  tablets. Take one tablet twice a day (up to 200 mg per day).  Skin Rashes: Aveeno products Benadryl cream or  every 6 hours as needed Calamine Lotion 1% cortisone cream  Yeast infection: Gyne-lotrimin 7 Monistat 7   **If taking multiple medications, please check labels to avoid duplicating the same active ingredients **take medication as directed on the label ** Do not exceed 4000 mg of tylenol in 24 hours **Do not take medications that contain aspirin or ibuprofen

## 2019-10-24 NOTE — Progress Notes (Signed)
I connected with Linard Millers on 10/24/19 at  4:15 PM EDT by: MyChart and verified that I am speaking with the correct person using two identifiers.  Patient is located at home and provider is located at [facility].     The purpose of this virtual visit is to provide medical care while limiting exposure to the novel coronavirus. I discussed the limitations, risks, security and privacy concerns of performing an evaluation and management service by MyChart and the availability of in person appointments. I also discussed with the patient that there may be a patient responsible charge related to this service. By engaging in this virtual visit, you consent to the provision of healthcare.  Additionally, you authorize for your insurance to be billed for the services provided during this visit.  The patient expressed understanding and agreed to proceed.  The following staff members participated in the virtual visit:  Vernice Jefferson, NP    PRENATAL VISIT NOTE  Subjective:  Tanya Moore is a 39 y.o. G2I9485 at [redacted]w[redacted]d  for phone visit for ongoing prenatal care.  She is currently monitored for the following issues for this high-risk pregnancy and has History of ectopic pregnancy in 11/2018; Hypercholesteremia; Skin lesion of left arm; Hemoperitoneum due to rupture of right tubal ectopic pregnancy; Ectopic pregnancy with intrauterine pregnancy/Heterotopic Pregnancy; Supervision of high risk pregnancy, antepartum; and History of low transverse cesarean section on their problem list.  Patient reports no complaints.  Contractions: Not present. Vag. Bleeding: None.  Movement: Present. Denies leaking of fluid.   The following portions of the patient's history were reviewed and updated as appropriate: allergies, current medications, past family history, past medical history, past social history, past surgical history and problem list.   Objective:   Vitals:   10/24/19 1617  BP: 136/87  Pulse: 99    Self-Obtained  Fetal Status:     Movement: Present     Assessment and Plan:  Pregnancy: G4P1021 at [redacted]w[redacted]d  1. Supervision of high risk pregnancy, antepartum -discussed COVID vaccine in pregnancy -discussed today's BP of 136/87, pt reports BP is normally lower and pt reports her BP was elevated in her third trimester with her previous pregnancy, but did not need to be delivered early for this -BP check in two days (might need to be virtual), pt advised to take blood pressure daily and notify clinic/report to MAU if BP is 140/90 or higher -discussed s/sx of preeclampsia, serious sequelae, when to report to MAU  2. History of low transverse cesarean section -repeat C/S scheduled for 12/18/2019  3. Other migraine without status migrainosus, not intractable -pt reports she has experienced severe headaches for the entire duration of pregnancy and is not currently taking any medication. Pt reports hx of migraines and has not been on anything for migraines in years. -pt reports she has tried Tylenol, but not 1000mg . Pt offered and declines medication for HA other than Tylenol. -safe meds in pregnancy list given, discussed can use 4000mg  of Tylenol in 24hrs -pt advised of MAU for severe HA treatment -referral to HA clinic   Preterm labor symptoms and general obstetric precautions including but not limited to vaginal bleeding, contractions, leaking of fluid and fetal movement were reviewed in detail with the patient. I discussed the assessment and treatment plan with the patient. The patient was provided an opportunity to ask questions and all were answered. The patient agreed with the plan and demonstrated an understanding of the instructions. The patient was advised to call back  or seek an in-person office evaluation/go to MAU at [facility] for any urgent or concerning symptoms.  Return in about 2 weeks (around 11/07/2019) for virtual LOB, 2 days for BP check (virtual if needed),  referral to HA clinic ASAP (virtual visit).  No future appointments.   Time spent on virtual visit: 15 minutes  Marylen Ponto, NP

## 2019-10-25 ENCOUNTER — Encounter: Payer: Self-pay | Admitting: *Deleted

## 2019-10-26 ENCOUNTER — Telehealth (INDEPENDENT_AMBULATORY_CARE_PROVIDER_SITE_OTHER): Payer: Medicaid Other | Admitting: Lactation Services

## 2019-10-26 DIAGNOSIS — O0990 Supervision of high risk pregnancy, unspecified, unspecified trimester: Secondary | ICD-10-CM

## 2019-10-26 NOTE — Progress Notes (Signed)
Called patient to check her BP. She reports her BP was much better today.   Patient still has headache, as she has daily. She denies  blurred vision or dizziness. She reports she has some mild swelling to her hands and feet.   Patient has no questions or concerns. Patient to call as needed.

## 2019-10-27 NOTE — Progress Notes (Signed)
Patient seen and assessed by nursing staff.  Agree with documentation and plan.  

## 2019-11-02 ENCOUNTER — Ambulatory Visit: Payer: Medicaid Other | Attending: Family

## 2019-11-02 DIAGNOSIS — Z23 Encounter for immunization: Secondary | ICD-10-CM

## 2019-11-02 NOTE — Progress Notes (Signed)
   Covid-19 Vaccination Clinic  Name:  NAJMO PARDUE    MRN: [id number] DOB: 1981-03-12  11/02/2019  [name] was observed post Covid-19 immunization for 15 minutes without incident. She was provided with Vaccine Information Sheet and instruction to access the V-Safe system.   Ms. Elwell was instructed to call 911 with any severe reactions post vaccine: Marland Kitchen Difficulty breathing  . Swelling of face and throat  . A fast heartbeat  . A bad rash all over body  . Dizziness and weakness   Immunizations Administered    Name Date Dose VIS Date Route   Moderna COVID-19 Vaccine 11/02/2019 10:00 AM 0.5 mL 07/11/2019 Intramuscular   Manufacturer: Moderna   Lot: [id number]   NDC: 71696-789-38

## 2019-11-06 NOTE — Progress Notes (Signed)
Subjective:  Tanya Moore is a 39 y.o. G4P1021 at [redacted]w[redacted]d being seen today for ongoing prenatal care.  She is currently monitored for the following issues for this low-risk pregnancy and has History of ectopic pregnancy in 11/2018; Hypercholesteremia; Skin lesion of left arm; Hemoperitoneum due to rupture of right tubal ectopic pregnancy; Ectopic pregnancy with intrauterine pregnancy/Heterotopic Pregnancy; Supervision of high risk pregnancy, antepartum; and History of low transverse cesarean section on their problem list.  Patient reports headache. Pt rates HA as 6/10, describes as NOT the worst headache she has ever had, does not feel different than the headaches she usually gets. Pt reports hx of migraine HA and has not taken anything for the headache today. Contractions: Irritability. Vag. Bleeding: None.  Movement: Present. Denies leaking of fluid.   The following portions of the patient's history were reviewed and updated as appropriate: allergies, current medications, past family history, past medical history, past social history, past surgical history and problem list. Problem list updated.  Objective:   Vitals:   11/07/19 1506 11/07/19 1518  BP: (!) 143/100 (!) 146/98  Pulse:  100  Weight: 203 lb 3.2 oz (92.2 kg)     Fetal Status: Fetal Heart Rate (bpm): 141   Movement: Present     General:  Alert, oriented and cooperative. Patient is in no acute distress.  Skin: Skin is warm and dry. No rash noted.   Cardiovascular: Normal heart rate noted  Respiratory: Normal respiratory effort, no problems with respiration noted  Abdomen: Soft, gravid, appropriate for gestational age. Pain/Pressure: Present     Pelvic: Vag. Bleeding: None     Cervical exam deferred        Extremities: Normal range of motion.  Edema: Trace  Mental Status: Normal mood and affect. Normal behavior. Normal judgment and thought content.   Urinalysis:      Assessment and Plan:  Pregnancy: G4P1021 at [redacted]w[redacted]d  1.  Supervision of high risk pregnancy, antepartum - virtual visit in one week to f/u from MAU visit -discussed next visit in 2 weeks for in-person for GBS/cultures  2. History of low transverse cesarean section -repeat C/S scheduled for 12/11/2019  3. Other migraine without status migrainosus, not intractable -pt reports she has experienced severe headaches for the entire duration of pregnancy and is not currently taking any medication other than extra strength Tylenol, which does not work. Pt reports hx of migraines and has not been on anything for migraines in years. -referral to HA clinic made last visit, pt reports she cannot go to the clinic until she establishes better child care or COVID restrictions for visitors are lifted.  4. New onset elevated blood pressure -pt to MAU for evaluation of preeclampsia, report called to MAU Provider office -s/sx of preeclampsia discussed  5. S>D -FH 59 today -growth [place] ordered  Preterm labor symptoms and general obstetric precautions including but not limited to vaginal bleeding, contractions, leaking of fluid and fetal movement were reviewed in detail with the patient. I discussed the assessment and treatment plan with the patient. The patient was provided an opportunity to ask questions and all were answered. The patient agreed with the plan and demonstrated an understanding of the instructions. The patient was advised to call back or seek an in-person office evaluation/go to MAU at [facility] for any urgent or concerning symptoms. Please refer to After Visit Summary for other counseling recommendations.   Return in about 1 week (around 11/14/2019) for virtual [facility] - discuss  BP/recent MAU visit, needs growth [place] ASAP.   Trejan Buda, Gerrie Nordmann, NP

## 2019-11-07 ENCOUNTER — Other Ambulatory Visit: Payer: Self-pay

## 2019-11-07 ENCOUNTER — Encounter (HOSPITAL_COMMUNITY): Payer: Self-pay | Admitting: Family Medicine

## 2019-11-07 ENCOUNTER — Inpatient Hospital Stay (HOSPITAL_COMMUNITY)
Admission: AD | Admit: 2019-11-07 | Discharge: 2019-11-07 | Disposition: A | Discharge status: Home / Self Care | Payer: Medicaid Other | Attending: Family Medicine | Admitting: Family Medicine

## 2019-11-07 ENCOUNTER — Ambulatory Visit (INDEPENDENT_AMBULATORY_CARE_PROVIDER_SITE_OTHER): Payer: Medicaid Other | Admitting: Women's Health

## 2019-11-07 VITALS — BP 146/98 | HR 100 | Wt 203.2 lb

## 2019-11-07 DIAGNOSIS — R519 Headache, unspecified: Secondary | ICD-10-CM | POA: Insufficient documentation

## 2019-11-07 DIAGNOSIS — O26893 Other specified pregnancy related conditions, third trimester: Secondary | ICD-10-CM | POA: Insufficient documentation

## 2019-11-07 DIAGNOSIS — Z3A34 34 weeks gestation of pregnancy: Secondary | ICD-10-CM | POA: Insufficient documentation

## 2019-11-07 DIAGNOSIS — O26843 Uterine size-date discrepancy, third trimester: Secondary | ICD-10-CM

## 2019-11-07 DIAGNOSIS — O0990 Supervision of high risk pregnancy, unspecified, unspecified trimester: Secondary | ICD-10-CM

## 2019-11-07 DIAGNOSIS — R030 Elevated blood-pressure reading, without diagnosis of hypertension: Secondary | ICD-10-CM | POA: Insufficient documentation

## 2019-11-07 DIAGNOSIS — Z98891 History of uterine scar from previous surgery: Secondary | ICD-10-CM

## 2019-11-07 LAB — CBC
HCT: 35.6 % — ABNORMAL LOW (ref 36.0–46.0)
Hemoglobin: 12.1 g/dL (ref 12.0–15.0)
MCH: 30.1 pg (ref 26.0–34.0)
MCHC: 34 g/dL (ref 30.0–36.0)
MCV: 88.6 fL (ref 80.0–100.0)
Platelets: 264 10*3/uL (ref 150–400)
RBC: 4.02 MIL/uL (ref 3.87–5.11)
RDW: 13.1 % (ref 11.5–15.5)
WBC: 9.7 10*3/uL (ref 4.0–10.5)
nRBC: 0 % (ref 0.0–0.2)

## 2019-11-07 LAB — URINALYSIS, ROUTINE W REFLEX MICROSCOPIC
Bilirubin Urine: NEGATIVE
Glucose, UA: NEGATIVE mg/dL
Hgb urine dipstick: NEGATIVE
Ketones, ur: NEGATIVE mg/dL
Leukocytes,Ua: NEGATIVE
Nitrite: NEGATIVE
Protein, ur: NEGATIVE mg/dL
Specific Gravity, Urine: 1.013 (ref 1.005–1.030)
pH: 8 (ref 5.0–8.0)

## 2019-11-07 LAB — COMPREHENSIVE METABOLIC PANEL
ALT: 17 U/L (ref 0–44)
AST: 19 U/L (ref 15–41)
Albumin: 2.6 g/dL — ABNORMAL LOW (ref 3.5–5.0)
Alkaline Phosphatase: 96 U/L (ref 38–126)
Anion gap: 10 (ref 5–15)
BUN: 5 mg/dL — ABNORMAL LOW (ref 6–20)
CO2: 25 mmol/L (ref 22–32)
Calcium: 8.7 mg/dL — ABNORMAL LOW (ref 8.9–10.3)
Chloride: 104 mmol/L (ref 98–111)
Creatinine, Ser: 0.51 mg/dL (ref 0.44–1.00)
GFR calc Af Amer: >60 mL/min (ref >60)
GFR calc non Af Amer: >60 mL/min (ref >60)
Glucose, Bld: 94 mg/dL (ref 70–99)
Potassium: 3.9 mmol/L (ref 3.5–5.1)
Sodium: 139 mmol/L (ref 135–145)
Total Bilirubin: 0.3 mg/dL (ref 0.3–1.2)
Total Protein: 5.7 g/dL — ABNORMAL LOW (ref 6.5–8.1)

## 2019-11-07 LAB — PROTEIN / CREATININE RATIO, URINE
Creatinine, Urine: 76.55 mg/dL
Protein Creatinine Ratio: 0.22 mg/mg{Cre} — ABNORMAL HIGH (ref 0.00–0.15)
Total Protein, Urine: 17 mg/dL

## 2019-11-07 MED ORDER — ACETAMINOPHEN 500 MG PO TABS
1000.0000 mg | ORAL_TABLET | Freq: Once | ORAL | Status: AC
  Administered 2019-11-07: 17:00:00 1000 mg via ORAL
  Filled 2019-11-07: qty 2

## 2019-11-07 MED ORDER — METOCLOPRAMIDE HCL 10 MG PO TABS
10.0000 mg | ORAL_TABLET | Freq: Once | ORAL | Status: AC
  Administered 2019-11-07: 10 mg via ORAL
  Filled 2019-11-07: qty 1

## 2019-11-07 NOTE — Patient Instructions (Signed)
Maternity Assessment Unit (MAU)  The Maternity Assessment Unit (MAU) is located at the [facility] at [facility]. The address is: [address], [address], [place], [place] [address]. Please see map below for additional directions.    The Maternity Assessment Unit is designed to help you during your pregnancy, and for up to 6 weeks after delivery, with any pregnancy- or postpartum-related emergencies, if you think you are in labor, or if your water has broken. For example, if you experience nausea and vomiting, vaginal bleeding, severe abdominal or pelvic pain, elevated blood pressure or other problems related to your pregnancy or postpartum time, please come to the Maternity Assessment Unit for assistance.           Preeclampsia and Eclampsia Preeclampsia is a serious condition that may develop during pregnancy. This condition causes high blood pressure and increased protein in your urine along with other symptoms, such as headaches and vision changes. These symptoms may develop as the condition gets worse. Preeclampsia may occur at 20 weeks of pregnancy or later. Diagnosing and treating preeclampsia early is very important. If not treated early, it can cause serious problems for you and your baby. One problem it can lead to is eclampsia. Eclampsia is a condition that causes muscle jerking or shaking (convulsions or seizures) and other serious problems for the mother. During pregnancy, delivering your baby may be the best treatment for preeclampsia or eclampsia. For most women, preeclampsia and eclampsia symptoms go away after giving birth. In rare cases, a woman may develop preeclampsia after giving birth (postpartum preeclampsia). This usually occurs within 48 hours after childbirth but may occur up to 6 weeks after giving birth. What are the causes? The cause of preeclampsia is not known. What increases the risk? The following risk factors make you more  likely to develop preeclampsia:  Being pregnant for the first time.  Having had preeclampsia during a past pregnancy.  Having a family history of preeclampsia.  Having high blood pressure.  Being pregnant with more than one baby.  Being 35 or older.  Being African-American.  Having kidney disease or diabetes.  Having medical conditions such as lupus or blood diseases.  Being very overweight (obese). What are the signs or symptoms? The most common symptoms are:  Severe headaches.  Vision problems, such as blurred or double vision.  Abdominal pain, especially upper abdominal pain. Other symptoms that may develop as the condition gets worse include:  Sudden weight gain.  Sudden swelling of the hands, face, legs, and feet.  Severe nausea and vomiting.  Numbness in the face, arms, legs, and feet.  Dizziness.  Urinating less than usual.  Slurred speech.  Convulsions or seizures. How is this diagnosed? There are no screening tests for preeclampsia. Your health care provider will ask you about symptoms and check for signs of preeclampsia during your prenatal visits. You may also have tests that include:  Checking your blood pressure.  Urine tests to check for protein. Your health care provider will check for this at every prenatal visit.  Blood tests.  Monitoring your baby's heart rate.  Ultrasound. How is this treated? You and your health care provider will determine the treatment approach that is best for you. Treatment may include:  Having more frequent prenatal exams to check for signs of preeclampsia, if you have an increased risk for preeclampsia.  Medicine to lower your blood pressure.  Staying in the hospital, if your condition is severe. There, treatment will   focus on controlling your blood pressure and the amount of fluids in your body (fluid retention).  Taking medicine (magnesium sulfate) to prevent seizures. This may be given as an injection or  through an IV.  Taking a low-dose aspirin during your pregnancy.  Delivering your baby early. You may have your labor started with medicine (induced), or you may have a cesarean delivery. Follow these instructions at home: Eating and drinking   Drink enough fluid to keep your urine pale yellow.  Avoid caffeine. Lifestyle  Do not use any products that contain nicotine or tobacco, such as cigarettes and e-cigarettes. If you need help quitting, ask your health care provider.  Do not use alcohol or drugs.  Avoid stress as much as possible. Rest and get plenty of sleep. General instructions  Take over-the-counter and prescription medicines only as told by your health care provider.  When lying down, lie on your left side. This keeps pressure off your major blood vessels.  When sitting or lying down, raise (elevate) your feet. Try putting some pillows underneath your lower legs.  Exercise regularly. Ask your health care provider what kinds of exercise are best for you.  Keep all follow-up and prenatal visits as told by your health care provider. This is important. How is this prevented? There is no known way of preventing preeclampsia or eclampsia from developing. However, to lower your risk of complications and detect problems early:  Get regular prenatal care. Your health care provider may be able to diagnose and treat the condition early.  Maintain a healthy weight. Ask your health care provider for help managing weight gain during pregnancy.  Work with your health care provider to manage any long-term (chronic) health conditions you have, such as diabetes or kidney problems.  You may have tests of your blood pressure and kidney function after giving birth.  Your health care provider may have you take low-dose aspirin during your next pregnancy. Contact a health care provider if:  You have symptoms that your health care provider told you may require more treatment or  monitoring, such as: ? Headaches. ? Nausea or vomiting. ? Abdominal pain. ? Dizziness. ? Light-headedness. Get help right away if:  You have severe: ? Abdominal pain. ? Headaches that do not get better. ? Dizziness. ? Vision problems. ? Confusion. ? Nausea or vomiting.  You have any of the following: ? A seizure. ? Sudden, rapid weight gain. ? Sudden swelling in your hands, ankles, or face. ? Trouble moving any part of your body. ? Numbness in any part of your body. ? Trouble speaking. ? Abnormal bleeding.  You faint. Summary  Preeclampsia is a serious condition that may develop during pregnancy.  This condition causes high blood pressure and increased protein in your urine along with other symptoms, such as headaches and vision changes.  Diagnosing and treating preeclampsia early is very important. If not treated early, it can cause serious problems for you and your baby.  Get help right away if you have symptoms that your health care provider told you to watch for. This information is not intended to replace advice given to you by your health care provider. Make sure you discuss any questions you have with your health care provider. Document Revised: 03/29/2018 Document Reviewed: 03/02/2016 Elsevier Patient Education  2020 Elsevier Inc.       Preterm Labor and Birth Information  The normal length of a pregnancy is 39-41 weeks. Preterm labor is when labor starts before 37 completed   weeks of pregnancy. What are the risk factors for preterm labor? Preterm labor is more likely to occur in women who:  Have certain infections during pregnancy such as a bladder infection, sexually transmitted infection, or infection inside the uterus (chorioamnionitis).  Have a shorter-than-normal cervix.  Have gone into preterm labor before.  Have had surgery on their cervix.  Are younger than age 17 or older than age 35.  Are African American.  Are pregnant with twins or  multiple babies (multiple gestation).  Take street drugs or smoke while pregnant.  Do not gain enough weight while pregnant.  Became pregnant shortly after having been pregnant. What are the symptoms of preterm labor? Symptoms of preterm labor include:  Cramps similar to those that can happen during a menstrual period. The cramps may happen with diarrhea.  Pain in the abdomen or lower back.  Regular uterine contractions that may feel like tightening of the abdomen.  A feeling of increased pressure in the pelvis.  Increased watery or bloody mucus discharge from the vagina.  Water breaking (ruptured amniotic sac). Why is it important to recognize signs of preterm labor? It is important to recognize signs of preterm labor because babies who are born prematurely may not be fully developed. This can put them at an increased risk for:  Long-term (chronic) heart and lung problems.  Difficulty immediately after birth with regulating body systems, including blood sugar, body temperature, heart rate, and breathing rate.  Bleeding in the brain.  Cerebral palsy.  Learning difficulties.  Death. These risks are highest for babies who are born before 34 weeks of pregnancy. How is preterm labor treated? Treatment depends on the length of your pregnancy, your condition, and the health of your baby. It may involve:  Having a stitch (suture) placed in your cervix to prevent your cervix from opening too early (cerclage).  Taking or being given medicines, such as: ? Hormone medicines. These may be given early in pregnancy to help support the pregnancy. ? Medicine to stop contractions. ? Medicines to help mature the baby's lungs. These may be prescribed if the risk of delivery is high. ? Medicines to prevent your baby from developing cerebral palsy. If the labor happens before 34 weeks of pregnancy, you may need to stay in the hospital. What should I do if I think I am in preterm labor? If  you think that you are going into preterm labor, call your health care provider right away. How can I prevent preterm labor in future pregnancies? To increase your chance of having a full-term pregnancy:  Do not use any tobacco products, such as cigarettes, chewing tobacco, and e-cigarettes. If you need help quitting, ask your health care provider.  Do not use street drugs or medicines that have not been prescribed to you during your pregnancy.  Talk with your health care provider before taking any herbal supplements, even if you have been taking them regularly.  Make sure you gain a healthy amount of weight during your pregnancy.  Watch for infection. If you think that you might have an infection, get it checked right away.  Make sure to tell your health care provider if you have gone into preterm labor before. This information is not intended to replace advice given to you by your health care provider. Make sure you discuss any questions you have with your health care provider. Document Revised: 11/18/2018 Document Reviewed: 12/18/2015 Elsevier Patient Education  2020 [employer] Inc.  

## 2019-11-07 NOTE — Discharge Instructions (Signed)
Hypertension During Pregnancy °High blood pressure (hypertension) is when the force of blood pumping through the arteries is too strong. Arteries are blood vessels that carry blood from the heart throughout the body. Hypertension during pregnancy can be mild or severe. Severe hypertension during pregnancy (preeclampsia) is a medical emergency that requires prompt evaluation and treatment. °Different types of hypertension can happen during pregnancy. These include: °· Chronic hypertension. This happens when you had high blood pressure before you became pregnant, and it continues during the pregnancy. Hypertension that develops before you are [redacted] weeks pregnant and continues during the pregnancy is also called chronic hypertension. If you have chronic hypertension, it will not go away after you have your baby. You will need follow-up visits with your health care provider after you have your baby. Your doctor may want you to keep taking medicine for your blood pressure. °· Gestational hypertension. This is hypertension that develops after the 20th week of pregnancy. Gestational hypertension usually goes away after you have your baby, but your health care provider will need to monitor your blood pressure to make sure that it is getting better. °· Preeclampsia. This is severe hypertension during pregnancy. This can cause serious complications for you and your baby and can also cause complications for you after the delivery of your baby. °· Postpartum preeclampsia. You may develop severe hypertension after giving birth. This usually occurs within 48 hours after childbirth but may occur up to 6 weeks after giving birth. This is rare. °How does this affect me? °Women who have hypertension during pregnancy have a greater chance of developing hypertension later in life or during future pregnancies. In some cases, hypertension during pregnancy can cause serious complications, such as: °· Stroke. °· Heart attack. °· Injury to  other organs, such as kidneys, lungs, or liver. °· Preeclampsia. °· Convulsions or seizures. °· Placental abruption. °How does this affect my baby? °Hypertension during pregnancy can affect your baby. Your baby may: °· Be born early (prematurely). °· Not weigh as much as he or she should at birth (low birth weight). °· Not tolerate labor well, leading to an unplanned cesarean delivery. °What are the risks? °There are certain factors that make it more likely for you to develop hypertension during pregnancy. These include: °· Having hypertension during a previous pregnancy. °· Being overweight. °· Being age 35 or older. °· Being pregnant for the first time. °· Being pregnant with more than one baby. °· Becoming pregnant using fertilization methods, such as IVF (in vitro fertilization). °· Having other medical problems, such as diabetes, kidney disease, or lupus. °· Having a family history of hypertension. °What can I do to lower my risk? °The exact cause of hypertension during pregnancy is not known. You may be able to lower your risk by: °· Maintaining a healthy weight. °· Eating a healthy and balanced diet. °· Following your health care provider's instructions about treating any long-term conditions that you had before becoming pregnant. °It is very important to keep all of your prenatal care appointments. Your health care provider will check your blood pressure and make sure that your pregnancy is progressing as expected. If a problem is found, early treatment can prevent complications. °How is this treated? °Treatment for hypertension during pregnancy varies depending on the type of hypertension you have and how serious it is. °· If you were taking medicine for high blood pressure before you became pregnant, talk with your health care provider. You may need to change medicine during pregnancy because   some medicines, like ACE inhibitors, may not be considered safe for your baby. °· If you have gestational  hypertension, your health care provider may order medicine to treat this during pregnancy. °· If you are at risk for preeclampsia, your health care provider may recommend that you take a low-dose aspirin during your pregnancy. °· If you have severe hypertension, you may need to be hospitalized so you and your baby can be monitored closely. You may also need to be given medicine to lower your blood pressure. This medicine may be given by mouth or through an IV. °· In some cases, if your condition gets worse, you may need to deliver your baby early. °Follow these instructions at home: °Eating and drinking ° °· Drink enough fluid to keep your urine pale yellow. °· Avoid caffeine. °Lifestyle °· Do not use any products that contain nicotine or tobacco, such as cigarettes, e-cigarettes, and chewing tobacco. If you need help quitting, ask your health care provider. °· Do not use alcohol or drugs. °· Avoid stress as much as possible. °· Rest and get plenty of sleep. °· Regular exercise can help to reduce your blood pressure. Ask your health care provider what kinds of exercise are best for you. °General instructions °· Take over-the-counter and prescription medicines only as told by your health care provider. °· Keep all prenatal and follow-up visits as told by your health care provider. This is important. °Contact a health care provider if: °· You have symptoms that your health care provider told you may require more treatment or monitoring, such as: °? Headaches. °? Nausea or vomiting. °? Abdominal pain. °? Dizziness. °? Light-headedness. °Get help right away if: °· You have: °? Severe abdominal pain that does not get better with treatment. °? A severe headache that does not get better. °? Vomiting that does not get better. °? Sudden, rapid weight gain. °? Sudden swelling in your hands, ankles, or face. °? Vaginal bleeding. °? Blood in your urine. °? Blurred or double vision. °? Shortness of breath or chest  pain. °? Weakness on one side of your body. °? Difficulty speaking. °· Your baby is not moving as much as usual. °Summary °· High blood pressure (hypertension) is when the force of blood pumping through the arteries is too strong. °· Hypertension during pregnancy can cause problems for you and your baby. °· Treatment for hypertension during pregnancy varies depending on the type of hypertension you have and how serious it is. °· Keep all prenatal and follow-up visits as told by your health care provider. This is important. °This information is not intended to replace advice given to you by your health care provider. Make sure you discuss any questions you have with your health care provider. °Document Revised: 11/17/2018 Document Reviewed: 08/23/2018 °Elsevier Patient Education © 2020 [employer] ° ° ° ° °Fetal Movement Counts °Patient Name: ________________________________________________ Patient Due Date: ____________________ °What is a fetal movement count? ° °A fetal movement count is the number of times that you feel your baby move during a certain amount of time. This may also be called a fetal kick count. A fetal movement count is recommended for every pregnant woman. You may be asked to start counting fetal movements as early as week 28 of your pregnancy. °Pay attention to when your baby is most active. You may notice your baby's sleep and wake cycles. You may also notice things that make your baby move more. You should do a fetal movement count: °· When   your baby is normally most active. °· At the same time each day. °A good time to count movements is while you are resting, after having something to eat and drink. °How do I count fetal movements? °1. Find a quiet, comfortable area. Sit, or lie down on your side. °2. Write down the date, the start time and stop time, and the number of movements that you felt between those two times. Take this information with you to your health care visits. °3. Write down  your start time when you feel the first movement. °4. Count kicks, flutters, swishes, rolls, and jabs. You should feel at least 10 movements. °5. You may stop counting after you have felt 10 movements, or if you have been counting for 2 hours. Write down the stop time. °6. If you do not feel 10 movements in 2 hours, contact your health care provider for further instructions. Your health care provider may want to do additional tests to assess your baby's well-being. °Contact a health care provider if: °· You feel fewer than 10 movements in 2 hours. °· Your baby is not moving like he or she usually does. °Date: ____________ Start time: ____________ Stop time: ____________ Movements: ____________ °Date: ____________ Start time: ____________ Stop time: ____________ Movements: ____________ °Date: ____________ Start time: ____________ Stop time: ____________ Movements: ____________ °Date: ____________ Start time: ____________ Stop time: ____________ Movements: ____________ °Date: ____________ Start time: ____________ Stop time: ____________ Movements: ____________ °Date: ____________ Start time: ____________ Stop time: ____________ Movements: ____________ °Date: ____________ Start time: ____________ Stop time: ____________ Movements: ____________ °Date: ____________ Start time: ____________ Stop time: ____________ Movements: ____________ °Date: ____________ Start time: ____________ Stop time: ____________ Movements: ____________ °This information is not intended to replace advice given to you by your health care provider. Make sure you discuss any questions you have with your health care provider. °Document Revised: 03/16/2019 Document Reviewed: 03/16/2019 °Elsevier Patient Education © 2020 [employer] ° °

## 2019-11-07 NOTE — MAU Note (Addendum)
BP elevated, +HA (did not take anything), denies visual changes, epigastric pain or increase in swelling.  Sent from office for further eval.

## 2019-11-07 NOTE — MAU Provider Note (Signed)
Chief Complaint  Patient presents with  . Headache  . Hypertension     First Provider Initiated Contact with Patient 11/07/19 1700      S: Tanya Moore  is a 39 y.o. y.o. year old [id number] female at [redacted]w[redacted]d weeks gestation who presents to MAU with elevated blood pressures. Denies Hx of hypertension. Current blood pressure medication: none.  Reports BP issues at the end of her last pregnancy but she didn't receive any kind of diagnosis.  Had her first elevated BP this afternoon in the office. Reports a headache today that she rates 5/10. Headache is consistent with her daily migraine headache. Hasn't treated symptoms b/c she states no meds work.   Associated symptoms: endorses Headache, denies vision changes, denies epigastric pain Contractions: denies  Vaginal bleeding: denies Fetal movement: good  O:  Patient Vitals for the past 24 hrs:  BP Temp Temp src Pulse Resp SpO2 Height Weight  11/07/19 1852 -- -- -- -- 16 -- -- --  11/07/19 1815 139/89 -- -- 83 -- 97 % -- --  11/07/19 1800 (!) 142/100 -- -- (!) 102 -- 97 % -- --  11/07/19 1745 139/90 -- -- 100 -- 98 % -- --  11/07/19 1730 (!) 142/94 -- -- (!) 102 -- 98 % -- --  11/07/19 1715 (!) 142/97 -- -- 100 -- 98 % -- --  11/07/19 1700 (!) 137/93 -- -- 99 -- 97 % -- --  11/07/19 1645 (!) 142/99 -- -- (!) 102 -- 99 % -- --  11/07/19 1635 (!) 129/93 98.5 F (36.9 C) Oral 96 -- 99 % -- --  11/07/19 1627 -- -- -- -- -- -- 5\' 4"  (1.626 m) 92.3 kg   General: NAD Heart: Regular rate Lungs: Normal rate and effort Abd: Soft, NT, Gravid, S=D Extremities: no Pedal edema Neuro: 2+ deep tendon reflexes, No clonus  NST:  Baseline: 145 bpm, Variability: Good {> 6 bpm), Accelerations: Reactive and Decelerations: Absent  Results for orders placed or performed during the hospital encounter of 11/07/19 (from the past 24 hour(s))  CBC     Status: Abnormal   Collection Time: 11/07/19  5:14 PM  Result Value Ref Range   WBC 9.7 4.0 - 10.5 K/uL    RBC 4.02 3.87 - 5.11 MIL/uL   Hemoglobin 12.1 12.0 - 15.0 g/dL   HCT 11/09/19 (L) 40.9 - 81.1 %   MCV 88.6 80.0 - 100.0 fL   MCH 30.1 26.0 - 34.0 pg   MCHC 34.0 30.0 - 36.0 g/dL   RDW 91.4 78.2 - 95.6 %   Platelets 264 150 - 400 K/uL   nRBC 0.0 0.0 - 0.2 %  Comprehensive metabolic panel     Status: Abnormal   Collection Time: 11/07/19  5:14 PM  Result Value Ref Range   Sodium 139 135 - 145 mmol/L   Potassium 3.9 3.5 - 5.1 mmol/L   Chloride 104 98 - 111 mmol/L   CO2 25 22 - 32 mmol/L   Glucose, Bld 94 70 - 99 mg/dL   BUN 5 (L) 6 - 20 mg/dL   Creatinine, Ser 11/09/19 0.44 - 1.00 mg/dL   Calcium 8.7 (L) 8.9 - 10.3 mg/dL   Total Protein 5.7 (L) 6.5 - 8.1 g/dL   Albumin 2.6 (L) 3.5 - 5.0 g/dL   AST 19 15 - 41 U/L   ALT 17 0 - 44 U/L   Alkaline Phosphatase 96 38 - 126 U/L   Total Bilirubin 0.3 0.3 -  1.2 mg/dL   GFR calc non Af Amer >60 >60 mL/min   GFR calc Af Amer >60 >60 mL/min   Anion gap 10 5 - 15  Urinalysis, Routine w reflex microscopic     Status: None   Collection Time: 11/07/19  5:16 PM  Result Value Ref Range   Color, Urine YELLOW YELLOW   APPearance CLEAR CLEAR   Specific Gravity, Urine 1.013 1.005 - 1.030   pH 8.0 5.0 - 8.0   Glucose, UA NEGATIVE NEGATIVE mg/dL   Hgb urine dipstick NEGATIVE NEGATIVE   Bilirubin Urine NEGATIVE NEGATIVE   Ketones, ur NEGATIVE NEGATIVE mg/dL   Protein, ur NEGATIVE NEGATIVE mg/dL   Nitrite NEGATIVE NEGATIVE   Leukocytes,Ua NEGATIVE NEGATIVE  Protein / creatinine ratio, urine     Status: Abnormal   Collection Time: 11/07/19  5:16 PM  Result Value Ref Range   Creatinine, Urine 76.55 mg/dL   Total Protein, Urine 17 mg/dL   Protein Creatinine Ratio 0.22 (H) 0.00 - 0.15 mg/mg[Cre]    MDM Elevated BPs in MAU. None severe range PEC labs normal Elevated BPs not 4 hours apart - will bring to the office for a BP check in a few days for GHTN diagnosis  Headache improved with tylenol & reglan  A:  1. Elevated BP without diagnosis of  hypertension   2. [redacted] weeks gestation of pregnancy      P:  Discharge home in stable condition Preeclampsia precautions Msg to [facility] for BP check appt  Jorje Guild, NP 11/07/2019 6:53 PM

## 2019-11-08 ENCOUNTER — Telehealth: Payer: Self-pay | Admitting: Radiology

## 2019-11-08 NOTE — Telephone Encounter (Signed)
Called and left message for patient to [facility] to schedule appointment from referral for New Headache appointment.

## 2019-11-08 NOTE — Telephone Encounter (Signed)
Patient called and stated that she had several Dr appointments before she delivers her baby and she doesn't feel that she can find a [employer] [facility] in the next month. States that she will call for appointment when she has time.

## 2019-11-09 ENCOUNTER — Ambulatory Visit (INDEPENDENT_AMBULATORY_CARE_PROVIDER_SITE_OTHER): Payer: Medicaid Other | Admitting: *Deleted

## 2019-11-09 ENCOUNTER — Other Ambulatory Visit: Payer: Self-pay

## 2019-11-09 VITALS — BP 143/98 | HR 116 | Wt 204.0 lb

## 2019-11-09 DIAGNOSIS — O139 Gestational [pregnancy-induced] hypertension without significant proteinuria, unspecified trimester: Secondary | ICD-10-CM

## 2019-11-09 DIAGNOSIS — O0990 Supervision of high risk pregnancy, unspecified, unspecified trimester: Secondary | ICD-10-CM

## 2019-11-09 NOTE — Progress Notes (Signed)
Here for bp check from MAU visit 3/30 .Marland Kitchen States has continued to have headaches. Today headache = 4.States doesn't take anything for headache. Denies edema.  BP elevated x2.  Informed me she had elevated BP at end of last pregnancy but it did not require treatment.  Discussed BP readings, assessment, reviewed history and labs from MAu visit with Dr.Ervin. Plan is to add BP to already ordered US on 4/5, change 4/6 ob fu from virtual to in person; and to start weekly BPP.  Also instructed Tanya Moore to go to [facility] MAU for sudden increase edema, BP elevated, worsening headache. She voices understanding.  Cesare Sumlin,RN

## 2019-11-09 NOTE — Progress Notes (Signed)
Agree with A & P. 

## 2019-11-10 ENCOUNTER — Encounter: Payer: Self-pay | Admitting: Medical

## 2019-11-13 ENCOUNTER — Other Ambulatory Visit: Payer: Self-pay

## 2019-11-13 ENCOUNTER — Ambulatory Visit (HOSPITAL_COMMUNITY)
Admission: RE | Admit: 2019-11-13 | Discharge: 2019-11-13 | Disposition: A | Discharge status: Home / Self Care | Payer: Medicaid Other | Source: Ambulatory Visit | Attending: Obstetrics and Gynecology | Admitting: Obstetrics and Gynecology

## 2019-11-13 DIAGNOSIS — O139 Gestational [pregnancy-induced] hypertension without significant proteinuria, unspecified trimester: Secondary | ICD-10-CM | POA: Insufficient documentation

## 2019-11-13 DIAGNOSIS — O0990 Supervision of high risk pregnancy, unspecified, unspecified trimester: Secondary | ICD-10-CM

## 2019-11-13 DIAGNOSIS — O34219 Maternal care for unspecified type scar from previous cesarean delivery: Secondary | ICD-10-CM

## 2019-11-13 DIAGNOSIS — Z362 Encounter for other antenatal screening follow-up: Secondary | ICD-10-CM | POA: Diagnosis not present

## 2019-11-13 DIAGNOSIS — O43193 Other malformation of placenta, third trimester: Secondary | ICD-10-CM

## 2019-11-13 DIAGNOSIS — O26843 Uterine size-date discrepancy, third trimester: Secondary | ICD-10-CM

## 2019-11-13 DIAGNOSIS — Z3A35 35 weeks gestation of pregnancy: Secondary | ICD-10-CM

## 2019-11-13 DIAGNOSIS — O09523 Supervision of elderly multigravida, third trimester: Secondary | ICD-10-CM | POA: Diagnosis not present

## 2019-11-14 ENCOUNTER — Other Ambulatory Visit (HOSPITAL_COMMUNITY): Payer: Self-pay | Admitting: *Deleted

## 2019-11-14 ENCOUNTER — Other Ambulatory Visit (HOSPITAL_COMMUNITY)
Admission: RE | Admit: 2019-11-14 | Discharge: 2019-11-14 | Disposition: A | Discharge status: Home / Self Care | Payer: Medicaid Other | Source: Ambulatory Visit | Attending: Obstetrics & Gynecology | Admitting: Obstetrics & Gynecology

## 2019-11-14 ENCOUNTER — Ambulatory Visit (INDEPENDENT_AMBULATORY_CARE_PROVIDER_SITE_OTHER): Payer: Medicaid Other | Admitting: Obstetrics & Gynecology

## 2019-11-14 DIAGNOSIS — O133 Gestational [pregnancy-induced] hypertension without significant proteinuria, third trimester: Secondary | ICD-10-CM

## 2019-11-14 DIAGNOSIS — Z3A35 35 weeks gestation of pregnancy: Secondary | ICD-10-CM

## 2019-11-14 DIAGNOSIS — Z3483 Encounter for supervision of other normal pregnancy, third trimester: Secondary | ICD-10-CM | POA: Insufficient documentation

## 2019-11-14 DIAGNOSIS — O34219 Maternal care for unspecified type scar from previous cesarean delivery: Secondary | ICD-10-CM

## 2019-11-14 LAB — POCT URINALYSIS DIP (DEVICE)
Bilirubin Urine: NEGATIVE
Glucose, UA: NEGATIVE mg/dL
Hgb urine dipstick: NEGATIVE
Ketones, ur: NEGATIVE mg/dL
Nitrite: NEGATIVE
Protein, ur: NEGATIVE mg/dL
Specific Gravity, Urine: 1.025 (ref 1.005–1.030)
Urobilinogen, UA: 0.2 mg/dL (ref 0.0–1.0)
pH: 7 (ref 5.0–8.0)

## 2019-11-14 MED ORDER — MAGNESIUM OXIDE -MG SUPPLEMENT 500 MG PO CAPS: 1.0000 | ORAL_CAPSULE | Freq: Every day | ORAL | 1 refills | Status: DC

## 2019-11-14 NOTE — Progress Notes (Signed)
Subjective:    Tanya Moore is a 39 y.o. [id number] [redacted]w[redacted]d being seen today for her obstetrical visit.  Patient reports no bleeding and no contractions. Fetal movement: normal. Denies HA, RUQ pain, vision changes  Objective:    BP (!) 148/93   Pulse 92   Wt 93 kg   LMP 03/13/2019   BMI 35.19 kg/m   Physical Exam  Vitals reviewed. Constitutional: She is oriented to person, place, and time. She appears well-developed and well-nourished.  HENT:  Head: Normocephalic and atraumatic.  Eyes: Pupils are equal, round, and reactive to light.  Cardiovascular: Normal rate.  Respiratory: Effort normal.  GI: Soft. She exhibits no distension. There is no abdominal tenderness. There is no guarding.  Musculoskeletal:        General: No edema.     Cervical back: Normal range of motion.  Neurological: She is alert and oriented to person, place, and time.  Skin: Skin is warm and dry.  Psychiatric: She has a normal mood and affect. Her behavior is normal.    Exam  FHT: Fetal Heart Rate (bpm): 150  Uterine Size:  36  Presentation:  vertex     Assessment:    Pregnancy:  [id number]    Plan:    Patient Active Problem List   Diagnosis Date Noted  . Gestational hypertension 11/09/2019  . Supervision of high risk pregnancy, antepartum 05/26/2019  . History of low transverse cesarean section 05/26/2019  . Hemoperitoneum due to rupture of right tubal ectopic pregnancy 05/03/2019  . Ectopic pregnancy with intrauterine pregnancy/Heterotopic Pregnancy 05/03/2019  . Hypercholesteremia 02/19/2019  . Skin lesion of left arm 02/19/2019  . History of ectopic pregnancy in 11/2018 11/20/2018   Gestational HTN stable, prev CD, will change RCS to 37-38wks. Cultures today.  discussed postpartum plans, Breastfeeding, IUD for contraception. Follow up in 1 week.

## 2019-11-14 NOTE — Patient Instructions (Signed)

## 2019-11-15 LAB — GC/CHLAMYDIA PROBE AMP (~~LOC~~) NOT AT ARMC
Chlamydia: NEGATIVE
Comment: NEGATIVE
Comment: NORMAL
Neisseria Gonorrhea: NEGATIVE

## 2019-11-17 ENCOUNTER — Telehealth (HOSPITAL_COMMUNITY): Payer: Self-pay | Admitting: *Deleted

## 2019-11-17 NOTE — Telephone Encounter (Signed)
Pt called requesting new CS date.  Informed it had not been rescheduled and she would need to contact her provider.

## 2019-11-18 LAB — CULTURE, BETA STREP (GROUP B ONLY): Strep Gp B Culture: NEGATIVE

## 2019-11-20 ENCOUNTER — Telehealth (INDEPENDENT_AMBULATORY_CARE_PROVIDER_SITE_OTHER): Payer: Medicaid Other | Admitting: General Practice

## 2019-11-20 DIAGNOSIS — Z7689 Persons encountering health services in other specified circumstances: Secondary | ICD-10-CM

## 2019-11-20 NOTE — Telephone Encounter (Signed)
Patient called and left message on nurse voicemail line stating she needs her c-section date moved up. It was discussed last week she needed a sooner date and it hasn't been changed yet. She has childcare concerns and thus needs a date soon.  Called patient back and discussed the message to move up her c-section date was accidentally sent to her instead of the surgery scheduler. Told her I have sent the message and hopefully she will hear something soon. Patient verbalized understanding.

## 2019-11-21 ENCOUNTER — Other Ambulatory Visit: Payer: Self-pay

## 2019-11-21 ENCOUNTER — Ambulatory Visit (HOSPITAL_COMMUNITY)
Admission: RE | Admit: 2019-11-21 | Discharge: 2019-11-21 | Disposition: A | Discharge status: Home / Self Care | Payer: Medicaid Other | Source: Ambulatory Visit | Attending: Obstetrics and Gynecology | Admitting: Obstetrics and Gynecology

## 2019-11-21 ENCOUNTER — Ambulatory Visit (HOSPITAL_COMMUNITY): Payer: Medicaid Other | Admitting: *Deleted

## 2019-11-21 ENCOUNTER — Encounter (HOSPITAL_COMMUNITY): Payer: Self-pay | Admitting: *Deleted

## 2019-11-21 ENCOUNTER — Inpatient Hospital Stay (HOSPITAL_COMMUNITY)
Admission: AD | Admit: 2019-11-21 | Discharge: 2019-11-21 | Disposition: A | Discharge status: Home Health | Payer: Medicaid Other | Attending: Family Medicine | Admitting: Family Medicine

## 2019-11-21 ENCOUNTER — Encounter (HOSPITAL_COMMUNITY): Payer: Self-pay | Admitting: Family Medicine

## 2019-11-21 DIAGNOSIS — O09523 Supervision of elderly multigravida, third trimester: Secondary | ICD-10-CM | POA: Diagnosis not present

## 2019-11-21 DIAGNOSIS — Z3A36 36 weeks gestation of pregnancy: Secondary | ICD-10-CM

## 2019-11-21 DIAGNOSIS — O0990 Supervision of high risk pregnancy, unspecified, unspecified trimester: Secondary | ICD-10-CM | POA: Insufficient documentation

## 2019-11-21 DIAGNOSIS — O34219 Maternal care for unspecified type scar from previous cesarean delivery: Secondary | ICD-10-CM | POA: Diagnosis not present

## 2019-11-21 DIAGNOSIS — O133 Gestational [pregnancy-induced] hypertension without significant proteinuria, third trimester: Secondary | ICD-10-CM

## 2019-11-21 DIAGNOSIS — O26843 Uterine size-date discrepancy, third trimester: Secondary | ICD-10-CM

## 2019-11-21 DIAGNOSIS — R030 Elevated blood-pressure reading, without diagnosis of hypertension: Secondary | ICD-10-CM | POA: Diagnosis present

## 2019-11-21 DIAGNOSIS — O43193 Other malformation of placenta, third trimester: Secondary | ICD-10-CM | POA: Diagnosis not present

## 2019-11-21 HISTORY — DX: Unspecified infectious disease: B99.9

## 2019-11-21 HISTORY — DX: Gestational (pregnancy-induced) hypertension without significant proteinuria, unspecified trimester: O13.9

## 2019-11-21 HISTORY — DX: Headache, unspecified: R51.9

## 2019-11-21 LAB — URINALYSIS, ROUTINE W REFLEX MICROSCOPIC
Bilirubin Urine: NEGATIVE
Glucose, UA: NEGATIVE mg/dL
Hgb urine dipstick: NEGATIVE
Ketones, ur: NEGATIVE mg/dL
Leukocytes,Ua: NEGATIVE
Nitrite: NEGATIVE
Protein, ur: NEGATIVE mg/dL
Specific Gravity, Urine: 1.013 (ref 1.005–1.030)
pH: 7 (ref 5.0–8.0)

## 2019-11-21 LAB — COMPREHENSIVE METABOLIC PANEL
ALT: 14 U/L (ref 0–44)
AST: 19 U/L (ref 15–41)
Albumin: 2.5 g/dL — ABNORMAL LOW (ref 3.5–5.0)
Alkaline Phosphatase: 103 U/L (ref 38–126)
Anion gap: 10 (ref 5–15)
BUN: 5 mg/dL — ABNORMAL LOW (ref 6–20)
CO2: 22 mmol/L (ref 22–32)
Calcium: 8.4 mg/dL — ABNORMAL LOW (ref 8.9–10.3)
Chloride: 101 mmol/L (ref 98–111)
Creatinine, Ser: 0.59 mg/dL (ref 0.44–1.00)
GFR calc Af Amer: >60 mL/min (ref >60)
GFR calc non Af Amer: >60 mL/min (ref >60)
Glucose, Bld: 140 mg/dL — ABNORMAL HIGH (ref 70–99)
Potassium: 3.4 mmol/L — ABNORMAL LOW (ref 3.5–5.1)
Sodium: 133 mmol/L — ABNORMAL LOW (ref 135–145)
Total Bilirubin: 0.6 mg/dL (ref 0.3–1.2)
Total Protein: 5.7 g/dL — ABNORMAL LOW (ref 6.5–8.1)

## 2019-11-21 LAB — PROTEIN / CREATININE RATIO, URINE
Creatinine, Urine: 79 mg/dL
Protein Creatinine Ratio: 0.16 mg/mg{Cre} — ABNORMAL HIGH (ref 0.00–0.15)
Total Protein, Urine: 13 mg/dL

## 2019-11-21 LAB — CBC
HCT: 34.6 % — ABNORMAL LOW (ref 36.0–46.0)
Hemoglobin: 11.7 g/dL — ABNORMAL LOW (ref 12.0–15.0)
MCH: 29.9 pg (ref 26.0–34.0)
MCHC: 33.8 g/dL (ref 30.0–36.0)
MCV: 88.5 fL (ref 80.0–100.0)
Platelets: 268 10*3/uL (ref 150–400)
RBC: 3.91 MIL/uL (ref 3.87–5.11)
RDW: 13 % (ref 11.5–15.5)
WBC: 10.1 10*3/uL (ref 4.0–10.5)
nRBC: 0 % (ref 0.0–0.2)

## 2019-11-21 NOTE — Discharge Instructions (Signed)
Fetal Movement Counts Patient Name: ________________________________________________ Patient Due Date: ____________________ What is a fetal movement count?  A fetal movement count is the number of times that you feel your baby move during a certain amount of time. This may also be called a fetal kick count. A fetal movement count is recommended for every pregnant woman. You may be asked to start counting fetal movements as early as week 28 of your pregnancy. Pay attention to when your baby is most active. You may notice your baby's sleep and wake cycles. You may also notice things that make your baby move more. You should do a fetal movement count:  When your baby is normally most active.  At the same time each day. A good time to count movements is while you are resting, after having something to eat and drink. How do I count fetal movements? 1. Find a quiet, comfortable area. Sit, or lie down on your side. 2. Write down the date, the start time and stop time, and the number of movements that you felt between those two times. Take this information with you to your health care visits. 3. Write down your start time when you feel the first movement. 4. Count kicks, flutters, swishes, rolls, and jabs. You should feel at least 10 movements. 5. You may stop counting after you have felt 10 movements, or if you have been counting for 2 hours. Write down the stop time. 6. If you do not feel 10 movements in 2 hours, contact your health care provider for further instructions. Your health care provider may want to do additional tests to assess your baby's well-being. Contact a health care provider if:  You feel fewer than 10 movements in 2 hours.  Your baby is not moving like he or she usually does. Date: ____________ Start time: ____________ Stop time: ____________ Movements: ____________ Date: ____________ Start time: ____________ Stop time: ____________ Movements: ____________ Date: ____________  Start time: ____________ Stop time: ____________ Movements: ____________ Date: ____________ Start time: ____________ Stop time: ____________ Movements: ____________ Date: ____________ Start time: ____________ Stop time: ____________ Movements: ____________ Date: ____________ Start time: ____________ Stop time: ____________ Movements: ____________ Date: ____________ Start time: ____________ Stop time: ____________ Movements: ____________ Date: ____________ Start time: ____________ Stop time: ____________ Movements: ____________ Date: ____________ Start time: ____________ Stop time: ____________ Movements: ____________ This information is not intended to replace advice given to you by your health care provider. Make sure you discuss any questions you have with your health care provider. Document Revised: 03/16/2019 Document Reviewed: 03/16/2019 Elsevier Patient Education  2020 [employer]     Hypertension During Pregnancy High blood pressure (hypertension) is when the force of blood pumping through the arteries is too strong. Arteries are blood vessels that carry blood from the heart throughout the body. Hypertension during pregnancy can be mild or severe. Severe hypertension during pregnancy (preeclampsia) is a medical emergency that requires prompt evaluation and treatment. Different types of hypertension can happen during pregnancy. These include:  Chronic hypertension. This happens when you had high blood pressure before you became pregnant, and it continues during the pregnancy. Hypertension that develops before you are [redacted] weeks pregnant and continues during the pregnancy is also called chronic hypertension. If you have chronic hypertension, it will not go away after you have your baby. You will need follow-up visits with your health care provider after you have your baby. Your doctor may want you to keep taking medicine for your blood pressure.  Gestational hypertension. This is    hypertension that develops after the 20th week of pregnancy. Gestational hypertension usually goes away after you have your baby, but your health care provider will need to monitor your blood pressure to make sure that it is getting better.  Preeclampsia. This is severe hypertension during pregnancy. This can cause serious complications for you and your baby and can also cause complications for you after the delivery of your baby.  Postpartum preeclampsia. You may develop severe hypertension after giving birth. This usually occurs within 48 hours after childbirth but may occur up to 6 weeks after giving birth. This is rare. How does this affect me? Women who have hypertension during pregnancy have a greater chance of developing hypertension later in life or during future pregnancies. In some cases, hypertension during pregnancy can cause serious complications, such as:  Stroke.  Heart attack.  Injury to other organs, such as kidneys, lungs, or liver.  Preeclampsia.  Convulsions or seizures.  Placental abruption. How does this affect my baby? Hypertension during pregnancy can affect your baby. Your baby may:  Be born early (prematurely).  Not weigh as much as he or she should at birth (low birth weight).  Not tolerate labor well, leading to an unplanned cesarean delivery. What are the risks? There are certain factors that make it more likely for you to develop hypertension during pregnancy. These include:  Having hypertension during a previous pregnancy.  Being overweight.  Being age 35 or older.  Being pregnant for the first time.  Being pregnant with more than one baby.  Becoming pregnant using fertilization methods, such as IVF (in vitro fertilization).  Having other medical problems, such as diabetes, kidney disease, or lupus.  Having a family history of hypertension. What can I do to lower my risk? The exact cause of hypertension during pregnancy is not known. You  may be able to lower your risk by:  Maintaining a healthy weight.  Eating a healthy and balanced diet.  Following your health care provider's instructions about treating any long-term conditions that you had before becoming pregnant. It is very important to keep all of your prenatal care appointments. Your health care provider will check your blood pressure and make sure that your pregnancy is progressing as expected. If a problem is found, early treatment can prevent complications. How is this treated? Treatment for hypertension during pregnancy varies depending on the type of hypertension you have and how serious it is.  If you were taking medicine for high blood pressure before you became pregnant, talk with your health care provider. You may need to change medicine during pregnancy because some medicines, like ACE inhibitors, may not be considered safe for your baby.  If you have gestational hypertension, your health care provider may order medicine to treat this during pregnancy.  If you are at risk for preeclampsia, your health care provider may recommend that you take a low-dose aspirin during your pregnancy.  If you have severe hypertension, you may need to be hospitalized so you and your baby can be monitored closely. You may also need to be given medicine to lower your blood pressure. This medicine may be given by mouth or through an IV.  In some cases, if your condition gets worse, you may need to deliver your baby early. Follow these instructions at home: Eating and drinking   Drink enough fluid to keep your urine pale yellow.  Avoid caffeine. Lifestyle  Do not use any products that contain nicotine or tobacco, such as cigarettes, e-cigarettes,   and chewing tobacco. If you need help quitting, ask your health care provider.  Do not use alcohol or drugs.  Avoid stress as much as possible.  Rest and get plenty of sleep.  Regular exercise can help to reduce your blood  pressure. Ask your health care provider what kinds of exercise are best for you. General instructions  Take over-the-counter and prescription medicines only as told by your health care provider.  Keep all prenatal and follow-up visits as told by your health care provider. This is important. Contact a health care provider if:  You have symptoms that your health care provider told you may require more treatment or monitoring, such as: ? Headaches. ? Nausea or vomiting. ? Abdominal pain. ? Dizziness. ? Light-headedness. Get help right away if:  You have: ? Severe abdominal pain that does not get better with treatment. ? A severe headache that does not get better. ? Vomiting that does not get better. ? Sudden, rapid weight gain. ? Sudden swelling in your hands, ankles, or face. ? Vaginal bleeding. ? Blood in your urine. ? Blurred or double vision. ? Shortness of breath or chest pain. ? Weakness on one side of your body. ? Difficulty speaking.  Your baby is not moving as much as usual. Summary  High blood pressure (hypertension) is when the force of blood pumping through the arteries is too strong.  Hypertension during pregnancy can cause problems for you and your baby.  Treatment for hypertension during pregnancy varies depending on the type of hypertension you have and how serious it is.  Keep all prenatal and follow-up visits as told by your health care provider. This is important. This information is not intended to replace advice given to you by your health care provider. Make sure you discuss any questions you have with your health care provider. Document Revised: 11/17/2018 Document Reviewed: 08/23/2018 Elsevier Patient Education  2020 Elsevier Inc.  

## 2019-11-21 NOTE — MAU Note (Signed)
Sent from [facility], elevated BP.  Reports slight headache, denies visual changes, epigastric pain or increased swelling.  Denies leaking or bleeding.

## 2019-11-21 NOTE — MAU Provider Note (Signed)
Chief Complaint  Patient presents with  . Hypertension     First Provider Initiated Contact with Patient 11/21/19 1841      S: Tanya Moore  is a 39 y.o. y.o. year old [id number] female at [redacted]w[redacted]d weeks gestation who presents to MAU with elevated blood pressures. Diagnosed with gestational hypertension a few weeks ago & is scheduled for a repeat c/section at 37 wks. Current blood pressure medication: none.  Was at [facility] for a BPP today; BP was elevated so sent here for evaluation.  Patient has history of migraines. States she currently has a headache that she rates 3/10. This is her baseline & she does not want to treat her current headache.   Associated symptoms: endorses Headache, denies vision changes, denies epigastric pain Contractions: denies Vaginal bleeding: denies Fetal movement: good  O:  Patient Vitals for the past 24 hrs:  BP Temp Temp src Pulse Resp SpO2 Height Weight  11/21/19 1935 -- -- -- -- -- 97 % -- --  11/21/19 1931 132/86 -- -- 100 -- -- -- --  11/21/19 1930 -- -- -- -- -- 96 % -- --  11/21/19 1925 -- -- -- -- -- 96 % -- --  11/21/19 1920 -- -- -- -- -- 97 % -- --  11/21/19 1919 -- -- -- -- -- 97 % -- --  11/21/19 1916 (!) 146/113 -- -- (!) 103 -- -- -- --  11/21/19 1915 -- -- -- -- -- 98 % -- --  11/21/19 1910 -- -- -- -- -- 98 % -- --  11/21/19 1909 -- -- -- -- -- 98 % -- --  11/21/19 1907 -- -- -- -- -- 98 % -- --  11/21/19 1905 -- -- -- -- -- 98 % -- --  11/21/19 1900 (!) 150/91 -- -- 96 -- 98 % -- --  11/21/19 1855 -- -- -- -- -- 98 % -- --  11/21/19 1850 -- -- -- -- -- 98 % -- --  11/21/19 1846 (!) 141/88 -- -- 96 -- -- -- --  11/21/19 1830 (!) 137/99 -- -- (!) 116 -- -- -- --  11/21/19 1815 (!) 145/100 -- -- (!) 106 -- 99 % -- --  11/21/19 1800 (!) 155/101 -- -- 98 -- 100 % -- --  11/21/19 1758 (!) 149/104 98.5 F (36.9 C) Oral 96 18 -- -- --  11/21/19 1738 -- -- -- -- -- -- 5\' 4"  (1.626 m) 95.5 kg   General: NAD Heart: Regular rate Lungs: Normal  rate and effort Abd: Soft, NT, Gravid, S=D Extremities: non pitting Pedal edema Neuro: 2+ deep tendon reflexes, No clonus  NST:  Baseline: 140 bpm, Variability: Good {> 6 bpm), Accelerations: Reactive and Decelerations: Absent  Results for orders placed or performed during the hospital encounter of 11/21/19 (from the past 24 hour(s))  CBC     Status: Abnormal   Collection Time: 11/21/19  6:19 PM  Result Value Ref Range   WBC 10.1 4.0 - 10.5 K/uL   RBC 3.91 3.87 - 5.11 MIL/uL   Hemoglobin 11.7 (L) 12.0 - 15.0 g/dL   HCT 11/23/19 (L) 37.1 - 69.6 %   MCV 88.5 80.0 - 100.0 fL   MCH 29.9 26.0 - 34.0 pg   MCHC 33.8 30.0 - 36.0 g/dL   RDW 78.9 38.1 - 01.7 %   Platelets 268 150 - 400 K/uL   nRBC 0.0 0.0 - 0.2 %  Comprehensive metabolic panel     Status:  Abnormal   Collection Time: 11/21/19  6:19 PM  Result Value Ref Range   Sodium 133 (L) 135 - 145 mmol/L   Potassium 3.4 (L) 3.5 - 5.1 mmol/L   Chloride 101 98 - 111 mmol/L   CO2 22 22 - 32 mmol/L   Glucose, Bld 140 (H) 70 - 99 mg/dL   BUN 5 (L) 6 - 20 mg/dL   Creatinine, Ser 0.59 0.44 - 1.00 mg/dL   Calcium 8.4 (L) 8.9 - 10.3 mg/dL   Total Protein 5.7 (L) 6.5 - 8.1 g/dL   Albumin 2.5 (L) 3.5 - 5.0 g/dL   AST 19 15 - 41 U/L   ALT 14 0 - 44 U/L   Alkaline Phosphatase 103 38 - 126 U/L   Total Bilirubin 0.6 0.3 - 1.2 mg/dL   GFR calc non Af Amer >60 >60 mL/min   GFR calc Af Amer >60 >60 mL/min   Anion gap 10 5 - 15  Urinalysis, Routine w reflex microscopic     Status: None   Collection Time: 11/21/19  6:40 PM  Result Value Ref Range   Color, Urine YELLOW YELLOW   APPearance CLEAR CLEAR   Specific Gravity, Urine 1.013 1.005 - 1.030   pH 7.0 5.0 - 8.0   Glucose, UA NEGATIVE NEGATIVE mg/dL   Hgb urine dipstick NEGATIVE NEGATIVE   Bilirubin Urine NEGATIVE NEGATIVE   Ketones, ur NEGATIVE NEGATIVE mg/dL   Protein, ur NEGATIVE NEGATIVE mg/dL   Nitrite NEGATIVE NEGATIVE   Leukocytes,Ua NEGATIVE NEGATIVE  Protein / creatinine ratio,  urine     Status: Abnormal   Collection Time: 11/21/19  6:40 PM  Result Value Ref Range   Creatinine, Urine 79.00 mg/dL   Total Protein, Urine 13 mg/dL   Protein Creatinine Ratio 0.16 (H) 0.00 - 0.15 mg/mg[Cre]    MDM Elevated BPs, severe DBP x 1, rest of BPs stable. Preeclampsia labs normal.  Patient has a baseline headache (longstanding history of migraines) - no change in her headache & declines treatment. No other severe features.   She had a BPP of 8/8 today & a reactive NST in MAU.  C/w Dr. Nehemiah Settle. Ok to discharge home. Cancel NST tomorrow. Patient should come to office on Friday for BP check before her surgery next Monday.    A:  1. Gestational hypertension, third trimester   2. [redacted] weeks gestation of pregnancy    P:  Discharge home in stable condition per consult with Truett Mainland, DO  Preeclampsia precautions. Msg to [facility] for BP check on Friday, will cancel NST tomorrow.  Return to maternity admissions as needed in emergencies  Jorje Guild, NP 11/21/2019 7:40 PM

## 2019-11-22 ENCOUNTER — Other Ambulatory Visit: Payer: Medicaid Other

## 2019-11-22 ENCOUNTER — Encounter (HOSPITAL_COMMUNITY): Payer: Self-pay

## 2019-11-22 NOTE — Patient Instructions (Signed)
Tanya Moore  11/22/2019   Your procedure is scheduled on:  11/27/2019  Arrive at 1030 at Entrance C on [address] at [facility]  and [employer]. You are invited to use the FREE valet parking or use the Visitor's parking deck.  Pick up the phone at the desk and dial [phone].  Call this number if you have problems the morning of surgery: [phone]  Remember:   Do not eat food:(After Midnight) Desps de medianoche.  Do not drink clear liquids: (After Midnight) Desps de medianoche.  Take these medicines the morning of surgery with A SIP OF WATER:  none   Do not wear jewelry, make-up or nail polish.  Do not wear lotions, powders, or perfumes. Do not wear deodorant.  Do not shave 48 hours prior to surgery.  Do not bring valuables to the hospital.  [facility] is not   responsible for any belongings or valuables brought to the hospital.  Contacts, dentures or bridgework may not be worn into surgery.  Leave suitcase in the car. After surgery it may be brought to your room.  For patients admitted to the hospital, checkout time is 11:00 AM the day of              discharge.      Please read over the following fact sheets that you were given:     Preparing for Surgery

## 2019-11-24 ENCOUNTER — Other Ambulatory Visit: Payer: Self-pay

## 2019-11-24 ENCOUNTER — Encounter (HOSPITAL_COMMUNITY): Payer: Self-pay | Admitting: Obstetrics and Gynecology

## 2019-11-24 ENCOUNTER — Inpatient Hospital Stay (HOSPITAL_COMMUNITY)
Admission: AD | Admit: 2019-11-24 | Discharge: 2019-11-29 | DRG: 788 | Disposition: A | Discharge status: Home / Self Care | Payer: Medicaid Other | Attending: Obstetrics and Gynecology | Admitting: Obstetrics and Gynecology

## 2019-11-24 ENCOUNTER — Ambulatory Visit (INDEPENDENT_AMBULATORY_CARE_PROVIDER_SITE_OTHER): Payer: Medicaid Other

## 2019-11-24 VITALS — BP 158/96 | HR 96 | Wt 209.5 lb

## 2019-11-24 DIAGNOSIS — O134 Gestational [pregnancy-induced] hypertension without significant proteinuria, complicating childbirth: Principal | ICD-10-CM | POA: Diagnosis present

## 2019-11-24 DIAGNOSIS — O9902 Anemia complicating childbirth: Secondary | ICD-10-CM | POA: Diagnosis present

## 2019-11-24 DIAGNOSIS — Z3A37 37 weeks gestation of pregnancy: Secondary | ICD-10-CM

## 2019-11-24 DIAGNOSIS — O139 Gestational [pregnancy-induced] hypertension without significant proteinuria, unspecified trimester: Secondary | ICD-10-CM | POA: Diagnosis present

## 2019-11-24 DIAGNOSIS — O34211 Maternal care for low transverse scar from previous cesarean delivery: Secondary | ICD-10-CM | POA: Diagnosis present

## 2019-11-24 DIAGNOSIS — Z975 Presence of (intrauterine) contraceptive device: Secondary | ICD-10-CM

## 2019-11-24 DIAGNOSIS — Z30430 Encounter for insertion of intrauterine contraceptive device: Secondary | ICD-10-CM

## 2019-11-24 DIAGNOSIS — Z98891 History of uterine scar from previous surgery: Secondary | ICD-10-CM

## 2019-11-24 DIAGNOSIS — Z20822 Contact with and (suspected) exposure to covid-19: Secondary | ICD-10-CM | POA: Diagnosis present

## 2019-11-24 DIAGNOSIS — Z0130 Encounter for examination of blood pressure without abnormal findings: Secondary | ICD-10-CM

## 2019-11-24 DIAGNOSIS — O0990 Supervision of high risk pregnancy, unspecified, unspecified trimester: Secondary | ICD-10-CM

## 2019-11-24 DIAGNOSIS — Z87891 Personal history of nicotine dependence: Secondary | ICD-10-CM

## 2019-11-24 DIAGNOSIS — D649 Anemia, unspecified: Secondary | ICD-10-CM | POA: Diagnosis present

## 2019-11-24 LAB — COMPREHENSIVE METABOLIC PANEL
ALT: 14 U/L (ref 0–44)
AST: 18 U/L (ref 15–41)
Albumin: 2.7 g/dL — ABNORMAL LOW (ref 3.5–5.0)
Alkaline Phosphatase: 111 U/L (ref 38–126)
Anion gap: 14 (ref 5–15)
BUN: 5 mg/dL — ABNORMAL LOW (ref 6–20)
CO2: 19 mmol/L — ABNORMAL LOW (ref 22–32)
Calcium: 9.2 mg/dL (ref 8.9–10.3)
Chloride: 104 mmol/L (ref 98–111)
Creatinine, Ser: 0.54 mg/dL (ref 0.44–1.00)
GFR calc Af Amer: >60 mL/min (ref >60)
GFR calc non Af Amer: >60 mL/min (ref >60)
Glucose, Bld: 74 mg/dL (ref 70–99)
Potassium: 3.6 mmol/L (ref 3.5–5.1)
Sodium: 137 mmol/L (ref 135–145)
Total Bilirubin: 0.8 mg/dL (ref 0.3–1.2)
Total Protein: 6.1 g/dL — ABNORMAL LOW (ref 6.5–8.1)

## 2019-11-24 LAB — TYPE AND SCREEN
ABO/RH(D): O POS
Antibody Screen: NEGATIVE

## 2019-11-24 LAB — URINALYSIS, ROUTINE W REFLEX MICROSCOPIC
Bilirubin Urine: NEGATIVE
Glucose, UA: NEGATIVE mg/dL
Hgb urine dipstick: NEGATIVE
Ketones, ur: NEGATIVE mg/dL
Leukocytes,Ua: NEGATIVE
Nitrite: NEGATIVE
Protein, ur: NEGATIVE mg/dL
Specific Gravity, Urine: 1.012 (ref 1.005–1.030)
pH: 7 (ref 5.0–8.0)

## 2019-11-24 LAB — CBC
HCT: 36.1 % (ref 36.0–46.0)
Hemoglobin: 12.3 g/dL (ref 12.0–15.0)
MCH: 30.1 pg (ref 26.0–34.0)
MCHC: 34.1 g/dL (ref 30.0–36.0)
MCV: 88.5 fL (ref 80.0–100.0)
Platelets: 257 10*3/uL (ref 150–400)
RBC: 4.08 MIL/uL (ref 3.87–5.11)
RDW: 13.1 % (ref 11.5–15.5)
WBC: 10.6 10*3/uL — ABNORMAL HIGH (ref 4.0–10.5)
nRBC: 0 % (ref 0.0–0.2)

## 2019-11-24 LAB — PROTEIN / CREATININE RATIO, URINE
Creatinine, Urine: 64.66 mg/dL
Protein Creatinine Ratio: 0.14 mg/mg{Cre} (ref 0.00–0.15)
Total Protein, Urine: 9 mg/dL

## 2019-11-24 LAB — SARS CORONAVIRUS 2 (TAT 6-24 HRS): SARS Coronavirus 2: NEGATIVE

## 2019-11-24 MED ORDER — SODIUM CHLORIDE 0.9% FLUSH
3.0000 mL | Freq: Two times a day (BID) | INTRAVENOUS | Status: DC
  Administered 2019-11-24 – 2019-11-26 (×4): 3 mL via INTRAVENOUS

## 2019-11-24 MED ORDER — ZOLPIDEM TARTRATE 5 MG PO TABS: 5.0000 mg | ORAL_TABLET | Freq: Every evening | ORAL | Status: DC | PRN

## 2019-11-24 MED ORDER — SODIUM CHLORIDE 0.9 % IV SOLN: 250.0000 mL | INTRAVENOUS | Status: DC | PRN

## 2019-11-24 MED ORDER — CALCIUM CARBONATE ANTACID 500 MG PO CHEW: 2.0000 | CHEWABLE_TABLET | ORAL | Status: DC | PRN

## 2019-11-24 MED ORDER — OXYCODONE HCL 5 MG PO TABS
5.0000 mg | ORAL_TABLET | Freq: Once | ORAL | Status: AC
  Administered 2019-11-24: 13:00:00 5 mg via ORAL
  Filled 2019-11-24: qty 1

## 2019-11-24 MED ORDER — SODIUM CHLORIDE 0.9% FLUSH
3.0000 mL | INTRAVENOUS | Status: DC | PRN
  Administered 2019-11-28: 12:00:00 3 mL via INTRAVENOUS

## 2019-11-24 MED ORDER — ACETAMINOPHEN 325 MG PO TABS
650.0000 mg | ORAL_TABLET | ORAL | Status: DC | PRN
  Administered 2019-11-26: 23:00:00 650 mg via ORAL
  Filled 2019-11-24: qty 2

## 2019-11-24 MED ORDER — PRENATAL MULTIVITAMIN CH
1.0000 | ORAL_TABLET | Freq: Every day | ORAL | Status: DC
  Administered 2019-11-26: 1 via ORAL
  Filled 2019-11-24 (×2): qty 1

## 2019-11-24 MED ORDER — BETAMETHASONE SOD PHOS & ACET 6 (3-3) MG/ML IJ SUSP
12.0000 mg | INTRAMUSCULAR | Status: AC
  Administered 2019-11-24 – 2019-11-25 (×2): 12 mg via INTRAMUSCULAR
  Filled 2019-11-24: qty 5

## 2019-11-24 MED ORDER — DOCUSATE SODIUM 100 MG PO CAPS
100.0000 mg | ORAL_CAPSULE | Freq: Every day | ORAL | Status: DC
  Administered 2019-11-25 – 2019-11-29 (×4): 100 mg via ORAL
  Filled 2019-11-24 (×4): qty 1

## 2019-11-24 MED ORDER — CYCLOBENZAPRINE HCL 10 MG PO TABS
10.0000 mg | ORAL_TABLET | Freq: Three times a day (TID) | ORAL | Status: DC | PRN
  Administered 2019-11-24: 20:00:00 10 mg via ORAL
  Filled 2019-11-24: qty 1

## 2019-11-24 MED ORDER — OXYCODONE-ACETAMINOPHEN 5-325 MG PO TABS
1.0000 | ORAL_TABLET | Freq: Four times a day (QID) | ORAL | Status: DC | PRN
  Administered 2019-11-25 (×2): 2 via ORAL
  Filled 2019-11-24 (×2): qty 2

## 2019-11-24 NOTE — Progress Notes (Signed)
[facility]N Attending Note  Subjective:  Patient denies any current headaches, visual symptoms, RUQ/epigastric pain or other concerning symptoms.  FHR reassuring, no contractions, no LOF or vaginal bleeding. Good FM.   Admitted on 11/24/2019 for Gestational hypertension.    Objective:  Blood pressure (!) 157/88, pulse (!) 103, temperature 98.6 F (37 C), temperature source Oral, resp. rate 19, height 5\' 4"  (1.626 m), weight 94.8 kg, last menstrual period 03/13/2019, SpO2 99 %, unknown if currently breastfeeding. FHT  Baseline 125 bpm, moderate variability, +accelerations, no decelerations Toco: none Gen: NAD HENT: Normocephalic, atraumatic Lungs: Normal respiratory effort Heart: Regular rate noted Abdomen: NT gravid fundus, soft Cervix: Deferred Ext: 2+ DTRs, no edema, no cyanosis, negative Homan's sign  Assessment & Plan:  39 y.o. 24 at [redacted]w[redacted]d admitted for observation for worsening GHTN, concern about possible severe features. No headaches currently or other severe feature. Has received one dose of betamethasone, Category I FHR tracing.  Will continue close observation. NPO after midnight ordered for now.   [redacted]w[redacted]d, MD, FACOG Obstetrician & Gynecologist, [facility]

## 2019-11-24 NOTE — H&P (Addendum)
OBSTETRIC ADMISSION HISTORY AND PHYSICAL  Tanya Moore is a 39 y.o. female [phone] with IUP at 2w4dpresenting for worsening gHTN. BP in office today 158/110. She reports +FMs. No LOF, VB, blurry vision, or RUQ pain. Reports daily HA throughout pregnancy. Rates HA pain 5/10. States nothing helps HA. Hx of migraines prior to pregnancy. She plans on breastfeeding. She requests IUD for birth control.  Dating: By LMP --->  Estimated Date of Delivery: 12/18/19  Sono:    @[redacted]w[redacted]d , normal anatomy, ceph presentation, 2949g, 83%ile, EFW 6'8  Prenatal History/Complications: - previous CS - gHTN - AMA  Past Medical History: Past Medical History:  Diagnosis Date   Allergy    Anxiety    Bipolar 1 disorder ([place]    Depression    Ectopic pregnancy with intrauterine pregnancy/Heterotopic Pregnancy 05/03/2019   S/p laparoscopic right salpingectomy on 05/03/19 05/03/19 SURGICAL PATHOLOGY FALLOPIAN TUBE WITH ECTOPIC PREGNANCY, RIGHT, SALPINGECTOMY:  - Benign fallopian tube with chorionic villi consistent with ectopic pregnancy.    Headache    Hemoperitoneum due to rupture of right tubal ectopic pregnancy 05/03/2019   History of ectopic pregnancy in 11/2018 11/20/2018   Mtx#1 given 4/3: Hcg day 0 (147), day 4 (180), day 7 (193) Mtx # 2 given 4/9 (193)->4/12 (153)->4/15 (79)   Hyperlipidemia    Infection    UTI   Pregnancy induced hypertension    Psoriasis 2020    Past Surgical History: Past Surgical History:  Procedure Laterality Date   APPENDECTOMY     BUNIONECTOMY     CESAREAN SECTION  2017   DIAGNOSTIC LAPAROSCOPY WITH REMOVAL OF ECTOPIC PREGNANCY N/A 05/03/2019   Procedure: RIGHT SALPINGECTOMY AND REMOVAL OF ECTOPIC PREGNANCY;  Surgeon: AOsborne Oman MD;  [facility]  Service: Gynecology;  Laterality: N/A;   TONSILLECTOMY      Obstetrical History: OB History     Gravida  4   Para  1   Term  1   Preterm      AB  2   Living  1      SAB      TAB  1   Ectopic  1    Multiple      Live Births  1           Social History: Social History   Socioeconomic History   Marital status: Single    Spouse name: Not on file   Number of children: Not on file   Years of education: Not on file   Highest education level: Not on file  Occupational History   Not on file  Tobacco Use   Smoking status: Former Smoker    Quit date: 2017    Years since quitting: 4.2   Smokeless tobacco: Never Used  Substance and Sexual Activity   Alcohol use: No   Drug use: No   Sexual activity: Not Currently    Birth control/protection: None  Other Topics Concern   Not on file  Social History Narrative   Not on file   Social Determinants of Health   Financial Resource Strain:    Difficulty of Paying Living Expenses:   Food Insecurity: No Food Insecurity   Worried About Running Out of Food in the Last Year: Never true   Ran Out of Food in the Last Year: Never true  Transportation Needs: No Transportation Needs   Lack of Transportation (Medical): No   Lack of Transportation (Non-Medical): No  Physical Activity:    Days of  Exercise per Week:    Minutes of Exercise per Session:   Stress:    Feeling of Stress :   Social Connections:    Frequency of Communication with Friends and Family:    Frequency of Social Gatherings with Friends and Family:    Attends Religious Services:    Active Member of Clubs or Organizations:    Attends Music therapist:    Marital Status:     Family History: Family History  Problem Relation Age of Onset   Hypertension Mother    Hypertension Father    Cancer Father        prostate   Depression Sister     Allergies: Allergies  Allergen Reactions   Lamictal [Lamotrigine] Rash    Patient reported    Medications Prior to Admission  Medication Sig Dispense Refill Last Dose   Blood Pressure Monitoring (BLOOD PRESSURE KIT) DEVI 1 Device by Does not apply route as needed. 1 Device 0 11/24/2019 at Unknown time    Calcium Carb-Cholecalciferol (CALCIUM + D3) 600-200 MG-UNIT TABS Take by mouth.   11/24/2019 at Unknown time   Magnesium Oxide 500 MG CAPS Take 1 capsule (500 mg total) by mouth daily. 30 capsule 1 11/24/2019 at Unknown time   Prenatal Vit-Fe Fumarate-FA (PRENATAL MULTIVITAMIN) TABS tablet Take 1 tablet by mouth daily at 12 noon.   11/24/2019 at Unknown time     Review of Systems:  All systems reviewed and negative except as stated in HPI  PE: Blood pressure (!) 138/101, pulse (!) 103, resp. rate 16, height 5' 4"  (1.626 m), weight 95 kg, last menstrual period 03/13/2019, unknown if currently breastfeeding.  Patient Vitals for the past 24 hrs:  BP Pulse Resp Height Weight  11/24/19 1416 (!) 152/96 (!) 106 -- -- --  11/24/19 1401 (!) 138/101 (!) 103 -- -- --  11/24/19 1346 (!) 140/96 88 -- -- --  11/24/19 1331 (!) 140/99 94 -- -- --  11/24/19 1316 (!) 146/95 92 -- -- --  11/24/19 1300 (!) 148/98 (!) 109 -- -- --  11/24/19 1246 (!) 131/102 (!) 101 -- -- --  11/24/19 1231 (!) 143/99 (!) 102 -- -- --  11/24/19 1226 (!) 153/93 (!) 105 -- -- --  11/24/19 1215 (!) 150/102 89 16 -- --  11/24/19 1214 -- -- -- 5' 4"  (1.626 m) 95 kg    General appearance: alert, cooperative and no distress Lungs: regular rate and effort Heart: regular rate  Abdomen: soft, non-tender Extremities: Homans sign is negative, no sign of DVT EFM: 125 bpm, mod variability, + accels, no decels Toco: rare SVE: deferred  Prenatal labs: ABO, Rh: O/Positive/-- (10/16 1044) Antibody: Negative (10/16 1044) Rubella: 11.80 (10/16 1044) RPR: Non Reactive (02/16 0823)  HBsAg: Negative (10/16 1044)  HIV: Non Reactive (02/16 0823)  GBS: Negative/-- (04/06 1057)  2 hr GTT normal  Prenatal Transfer Tool  Maternal Diabetes: No Genetic Screening: Normal Maternal Ultrasounds/Referrals: Normal Fetal Ultrasounds or other Referrals:  None Maternal Substance Abuse:  No Significant Maternal Medications:  None Significant  Maternal Lab Results: None  Results for orders placed or performed during the hospital encounter of 11/24/19 (from the past 24 hour(s))  Urinalysis, Routine w reflex microscopic   Collection Time: 11/24/19 12:41 PM  Result Value Ref Range   Color, Urine YELLOW YELLOW   APPearance HAZY (A) CLEAR   Specific Gravity, Urine 1.012 1.005 - 1.030   pH 7.0 5.0 - 8.0   Glucose, UA NEGATIVE NEGATIVE  mg/dL   Hgb urine dipstick NEGATIVE NEGATIVE   Bilirubin Urine NEGATIVE NEGATIVE   Ketones, ur NEGATIVE NEGATIVE mg/dL   Protein, ur NEGATIVE NEGATIVE mg/dL   Nitrite NEGATIVE NEGATIVE   Leukocytes,Ua NEGATIVE NEGATIVE  Protein / creatinine ratio, urine   Collection Time: 11/24/19 12:41 PM  Result Value Ref Range   Creatinine, Urine 64.66 mg/dL   Total Protein, Urine 9 mg/dL   Protein Creatinine Ratio 0.14 0.00 - 0.15 mg/mg[Cre]  CBC   Collection Time: 11/24/19 12:55 PM  Result Value Ref Range   WBC 10.6 (H) 4.0 - 10.5 K/uL   RBC 4.08 3.87 - 5.11 MIL/uL   Hemoglobin 12.3 12.0 - 15.0 g/dL   HCT 36.1 36.0 - 46.0 %   MCV 88.5 80.0 - 100.0 fL   MCH 30.1 26.0 - 34.0 pg   MCHC 34.1 30.0 - 36.0 g/dL   RDW 13.1 11.5 - 15.5 %   Platelets 257 150 - 400 K/uL   nRBC 0.0 0.0 - 0.2 %  Comprehensive metabolic panel   Collection Time: 11/24/19 12:55 PM  Result Value Ref Range   Sodium 137 135 - 145 mmol/L   Potassium 3.6 3.5 - 5.1 mmol/L   Chloride 104 98 - 111 mmol/L   CO2 19 (L) 22 - 32 mmol/L   Glucose, Bld 74 70 - 99 mg/dL   BUN 5 (L) 6 - 20 mg/dL   Creatinine, Ser 0.54 0.44 - 1.00 mg/dL   Calcium 9.2 8.9 - 10.3 mg/dL   Total Protein 6.1 (L) 6.5 - 8.1 g/dL   Albumin 2.7 (L) 3.5 - 5.0 g/dL   AST 18 15 - 41 U/L   ALT 14 0 - 44 U/L   Alkaline Phosphatase 111 38 - 126 U/L   Total Bilirubin 0.8 0.3 - 1.2 mg/dL   GFR calc non Af Amer >60 >60 mL/min   GFR calc Af Amer >60 >60 mL/min   Anion gap 14 5 - 15    Patient Active Problem List   Diagnosis Date Noted   Gestational hypertension  11/09/2019   Supervision of high risk pregnancy, antepartum 05/26/2019   History of low transverse cesarean section 05/26/2019   Hypercholesteremia 02/19/2019   Skin lesion of left arm 02/19/2019    Assessment: 5w4dweeks gestation Gestational HTN Reactive NST Previous CS   [facility] Consider delivery for worsening condition  MJulianne Handler CNM  11/24/2019, 2:18 PM   OB Attending Pt seen and examined. HA has improved 5/10 but has not eaten today. + FM. Reviewed POC to monitor BP's and give BMZ for fetal lung maturity. Proceed toward delivery for maternal and/or fetal indications.  MArlina Robes MD

## 2019-11-24 NOTE — Progress Notes (Signed)
Pt here today for BP check.  Pt denies any headache or visual disturbances.  Pt reports that she has a c-section scheduled on 11/27/19 due to elevated BP.  BP RA 158/110.  BP recheck manually 158/96.  Notified Dr. Vergie Living who recommends that the pt go to MAU for evaluation.  Notified pt provider's recommendation.   Pt states that she will need to run home and get things in order before going to MAU.  Report given to Ginger, MAU charge.    Addison Naegeli, RN  11/24/19

## 2019-11-24 NOTE — MAU Note (Signed)
.   Tanya Moore is a 39 y.o. at [redacted]w[redacted]d here in MAU reporting: she was sent here to MAU to evaluate B/P. Pt is scheduled for a repeat C/S on Monday April the 19th. Denies any pain  Onset of complaint: ongoing Pain score: 0 Vitals:   11/24/19 1215  BP: (!) 150/102  Pulse: 89  Resp: 16     FHT:158 Lab orders placed from triage: UA

## 2019-11-25 ENCOUNTER — Other Ambulatory Visit (HOSPITAL_COMMUNITY)
Admission: AD | Admit: 2019-11-25 | Discharge: 2019-11-25 | Disposition: A | Discharge status: Home / Self Care | Payer: Medicaid Other | Attending: Family Medicine | Admitting: Family Medicine

## 2019-11-25 DIAGNOSIS — Z87891 Personal history of nicotine dependence: Secondary | ICD-10-CM | POA: Diagnosis not present

## 2019-11-25 DIAGNOSIS — D649 Anemia, unspecified: Secondary | ICD-10-CM | POA: Diagnosis not present

## 2019-11-25 DIAGNOSIS — O133 Gestational [pregnancy-induced] hypertension without significant proteinuria, third trimester: Secondary | ICD-10-CM | POA: Diagnosis not present

## 2019-11-25 DIAGNOSIS — Z3A37 37 weeks gestation of pregnancy: Secondary | ICD-10-CM | POA: Diagnosis not present

## 2019-11-25 DIAGNOSIS — O9902 Anemia complicating childbirth: Secondary | ICD-10-CM | POA: Diagnosis not present

## 2019-11-25 DIAGNOSIS — Z3A36 36 weeks gestation of pregnancy: Secondary | ICD-10-CM | POA: Diagnosis not present

## 2019-11-25 DIAGNOSIS — Z98891 History of uterine scar from previous surgery: Secondary | ICD-10-CM | POA: Diagnosis not present

## 2019-11-25 DIAGNOSIS — O134 Gestational [pregnancy-induced] hypertension without significant proteinuria, complicating childbirth: Secondary | ICD-10-CM | POA: Diagnosis not present

## 2019-11-25 DIAGNOSIS — O34219 Maternal care for unspecified type scar from previous cesarean delivery: Secondary | ICD-10-CM | POA: Diagnosis not present

## 2019-11-25 DIAGNOSIS — O34211 Maternal care for low transverse scar from previous cesarean delivery: Secondary | ICD-10-CM | POA: Diagnosis not present

## 2019-11-25 DIAGNOSIS — Z30430 Encounter for insertion of intrauterine contraceptive device: Secondary | ICD-10-CM | POA: Diagnosis not present

## 2019-11-25 DIAGNOSIS — Z3A00 Weeks of gestation of pregnancy not specified: Secondary | ICD-10-CM | POA: Diagnosis not present

## 2019-11-25 DIAGNOSIS — Z20822 Contact with and (suspected) exposure to covid-19: Secondary | ICD-10-CM | POA: Diagnosis not present

## 2019-11-25 LAB — COMPREHENSIVE METABOLIC PANEL WITH GFR
ALT: 15 U/L (ref 0–44)
AST: 15 U/L (ref 15–41)
Albumin: 2.6 g/dL — ABNORMAL LOW (ref 3.5–5.0)
Alkaline Phosphatase: 103 U/L (ref 38–126)
Anion gap: 10 (ref 5–15)
BUN: 7 mg/dL (ref 6–20)
CO2: 21 mmol/L — ABNORMAL LOW (ref 22–32)
Calcium: 9.1 mg/dL (ref 8.9–10.3)
Chloride: 107 mmol/L (ref 98–111)
Creatinine, Ser: 0.54 mg/dL (ref 0.44–1.00)
GFR calc Af Amer: >60 mL/min
GFR calc non Af Amer: >60 mL/min
Glucose, Bld: 122 mg/dL — ABNORMAL HIGH (ref 70–99)
Potassium: 3.6 mmol/L (ref 3.5–5.1)
Sodium: 138 mmol/L (ref 135–145)
Total Bilirubin: 0.6 mg/dL (ref 0.3–1.2)
Total Protein: 5.7 g/dL — ABNORMAL LOW (ref 6.5–8.1)

## 2019-11-25 LAB — CBC
HCT: 35.2 % — ABNORMAL LOW (ref 36.0–46.0)
Hemoglobin: 11.7 g/dL — ABNORMAL LOW (ref 12.0–15.0)
MCH: 29.8 pg (ref 26.0–34.0)
MCHC: 33.2 g/dL (ref 30.0–36.0)
MCV: 89.6 fL (ref 80.0–100.0)
Platelets: 251 10*3/uL (ref 150–400)
RBC: 3.93 MIL/uL (ref 3.87–5.11)
RDW: 13.2 % (ref 11.5–15.5)
WBC: 12.6 10*3/uL — ABNORMAL HIGH (ref 4.0–10.5)
nRBC: 0 % (ref 0.0–0.2)

## 2019-11-25 NOTE — Progress Notes (Signed)
Faculty Practice OB/GYN Attending Note  Subjective:  Patient reports occasional mild headache (4/10) like her usual baseline, but no visual symptoms, RUQ/epigastric pain or other concerning symptoms.  FHR reassuring, no contractions, no LOF or vaginal bleeding. Good FM.   Admitted on 11/24/2019 for Gestational hypertension.    Objective:  Blood pressure 140/87, pulse 94, temperature 97.8 F (36.6 C), temperature source Oral, resp. rate 19, height 5\' 4"  (1.626 m), weight 94.8 kg, last menstrual period 03/13/2019, SpO2 100 %, unknown if currently breastfeeding.  Vitals:   11/24/19 1536 11/24/19 1946 11/24/19 2218 11/25/19 0704  BP: (!) 146/90 (!) 157/88 (!) 141/78 140/87  Pulse: 89 (!) 103 (!) 102 94  Resp:  19 20 19   Temp:  98.6 F (37 C) 98.1 F (36.7 C) 97.8 F (36.6 C)  TempSrc:  Oral Oral Oral  SpO2:  99% 99% 100%  Weight:      Height:       FHT  Baseline 150 bpm, moderate variability, +accelerations, no decelerations Toco: none Gen: NAD HENT: Normocephalic, atraumatic Lungs: Normal respiratory effort Heart: Regular rate noted Abdomen: NT gravid fundus, soft Cervix: Deferred Ext: 2+ DTRs, no edema, no cyanosis, negative Homan's sign  Assessment & Plan:  39 y.o. at [redacted]w[redacted]d admitted for observation for worsening GHTN, concern about possible severe features. No severe features. Has received one dose of betamethasone, Category I FHR tracing.  Betamethasone x 2 to be given later today.  Hope for RCS delivery tomorrow or 11/27/19 if remains stable, will deliver earlier for worsening maternal-fetal condition. Will continue close inpatient observation for now.   [redacted]w[redacted]d, MD, FACOG Obstetrician & Gynecologist, [facility] for Jaynie Collins, [facility]

## 2019-11-26 MED ORDER — LACTATED RINGERS IV SOLN: INTRAVENOUS | Status: DC

## 2019-11-26 MED ORDER — OXYCODONE HCL 5 MG PO TABS
10.0000 mg | ORAL_TABLET | Freq: Once | ORAL | Status: AC
  Administered 2019-11-26: 23:00:00 10 mg via ORAL
  Filled 2019-11-26: qty 2

## 2019-11-26 NOTE — Progress Notes (Signed)
Patient ID: Tanya Moore, female   DOB: 13-Mar-1981, 39 y.o.   MRN: [id number] FACULTY PRACTICE ANTEPARTUM(COMPREHENSIVE) NOTE  TERILYN SANO is a 39 y.o. [id number] at [redacted]w[redacted]d by best clinical estimate who is admitted for Gestational hypertension. Fetal presentation is cephalic. Length of Stay:  1  Days  Subjective: Headache is decreased Patient reports the fetal movement as active. Patient reports uterine contraction  activity as none. Patient reports  vaginal bleeding as none. Patient describes fluid per vagina as None.  Vitals:  Blood pressure 133/85, pulse 81, temperature 98.4 F (36.9 C), temperature source Oral, resp. rate 17, height 5\' 4"  (1.626 m), weight 94.8 kg, last menstrual period 03/13/2019, SpO2 99 %, unknown if currently breastfeeding. Physical Examination:  General appearance - alert, well appearing, and in no distress Heart - normal rate and regular rhythm Abdomen - soft, nontender, nondistended Fundal Height:  size equals dates Cervical Exam: Not evaluated. Extremities: extremities normal, atraumatic, no cyanosis or edema and Homans sign is negative, no sign of DVT  Membranes:intact  Fetal Monitoring:  Baseline: 130 bpm, Variability: Good {> 6 bpm), Accelerations: Reactive and Decelerations: Absent  Labs:  No results found for this or any previous visit (from the past 24 hour(s)).   Medications:  Scheduled . docusate sodium  100 mg Oral Daily  . prenatal multivitamin  1 tablet Oral Q1200  . sodium chloride flush  3 mL Intravenous Q12H   I have reviewed the patient's current medications.  ASSESSMENT: Patient Active Problem List   Diagnosis Date Noted  . Gestational hypertension 11/09/2019  . Supervision of high risk pregnancy, antepartum 05/26/2019  . History of low transverse cesarean section 05/26/2019  . Hypercholesteremia 02/19/2019  . Skin lesion of left arm 02/19/2019    PLAN: Observation for severe features until planned cesarean section  tomorrow  04/22/2019 11/26/2019,7:17 AM

## 2019-11-27 ENCOUNTER — Inpatient Hospital Stay (HOSPITAL_COMMUNITY): Payer: Medicaid Other | Admitting: Certified Registered"

## 2019-11-27 ENCOUNTER — Encounter (HOSPITAL_COMMUNITY)
Admission: AD | Disposition: A | Discharge status: Home / Self Care | Payer: Self-pay | Source: Home / Self Care | Attending: Obstetrics and Gynecology

## 2019-11-27 ENCOUNTER — Inpatient Hospital Stay (HOSPITAL_COMMUNITY)
Admission: RE | Admit: 2019-11-27 | Payer: Medicaid Other | Source: Home / Self Care | Admitting: Obstetrics and Gynecology

## 2019-11-27 ENCOUNTER — Encounter (HOSPITAL_COMMUNITY): Payer: Self-pay | Admitting: Obstetrics and Gynecology

## 2019-11-27 DIAGNOSIS — Z98891 History of uterine scar from previous surgery: Secondary | ICD-10-CM

## 2019-11-27 DIAGNOSIS — O34219 Maternal care for unspecified type scar from previous cesarean delivery: Secondary | ICD-10-CM | POA: Diagnosis not present

## 2019-11-27 DIAGNOSIS — Z3A37 37 weeks gestation of pregnancy: Secondary | ICD-10-CM

## 2019-11-27 DIAGNOSIS — Z3A36 36 weeks gestation of pregnancy: Secondary | ICD-10-CM

## 2019-11-27 DIAGNOSIS — O133 Gestational [pregnancy-induced] hypertension without significant proteinuria, third trimester: Secondary | ICD-10-CM | POA: Diagnosis not present

## 2019-11-27 DIAGNOSIS — Z30430 Encounter for insertion of intrauterine contraceptive device: Secondary | ICD-10-CM | POA: Diagnosis not present

## 2019-11-27 DIAGNOSIS — Z975 Presence of (intrauterine) contraceptive device: Secondary | ICD-10-CM

## 2019-11-27 LAB — CBC
HCT: 32.1 % — ABNORMAL LOW (ref 36.0–46.0)
Hemoglobin: 10.8 g/dL — ABNORMAL LOW (ref 12.0–15.0)
MCH: 30.9 pg (ref 26.0–34.0)
MCHC: 33.6 g/dL (ref 30.0–36.0)
MCV: 91.7 fL (ref 80.0–100.0)
Platelets: 210 10*3/uL (ref 150–400)
RBC: 3.5 MIL/uL — ABNORMAL LOW (ref 3.87–5.11)
RDW: 13.2 % (ref 11.5–15.5)
WBC: 11.7 10*3/uL — ABNORMAL HIGH (ref 4.0–10.5)
nRBC: 0.3 % — ABNORMAL HIGH (ref 0.0–0.2)

## 2019-11-27 LAB — COMPREHENSIVE METABOLIC PANEL
ALT: 11 U/L (ref 0–44)
AST: 15 U/L (ref 15–41)
Albumin: 2.3 g/dL — ABNORMAL LOW (ref 3.5–5.0)
Alkaline Phosphatase: 100 U/L (ref 38–126)
Anion gap: 10 (ref 5–15)
BUN: <5 mg/dL — ABNORMAL LOW (ref 6–20)
CO2: 20 mmol/L — ABNORMAL LOW (ref 22–32)
Calcium: 8.4 mg/dL — ABNORMAL LOW (ref 8.9–10.3)
Chloride: 108 mmol/L (ref 98–111)
Creatinine, Ser: 0.56 mg/dL (ref 0.44–1.00)
GFR calc Af Amer: >60 mL/min (ref >60)
GFR calc non Af Amer: >60 mL/min (ref >60)
Glucose, Bld: 89 mg/dL (ref 70–99)
Potassium: 3.5 mmol/L (ref 3.5–5.1)
Sodium: 138 mmol/L (ref 135–145)
Total Bilirubin: 0.4 mg/dL (ref 0.3–1.2)
Total Protein: 5.2 g/dL — ABNORMAL LOW (ref 6.5–8.1)

## 2019-11-27 LAB — TYPE AND SCREEN
ABO/RH(D): O POS
Antibody Screen: NEGATIVE

## 2019-11-27 LAB — RPR: RPR Ser Ql: NONREACTIVE

## 2019-11-27 SURGERY — Surgical Case
Anesthesia: Spinal | Site: Abdomen | Wound class: Clean Contaminated

## 2019-11-27 MED ORDER — KETOROLAC TROMETHAMINE 30 MG/ML IJ SOLN
30.0000 mg | Freq: Four times a day (QID) | INTRAMUSCULAR | Status: AC
  Administered 2019-11-27 – 2019-11-28 (×3): 30 mg via INTRAVENOUS
  Filled 2019-11-27 (×3): qty 1

## 2019-11-27 MED ORDER — MORPHINE SULFATE (PF) 0.5 MG/ML IJ SOLN
INTRAMUSCULAR | Status: AC
  Filled 2019-11-27: qty 10

## 2019-11-27 MED ORDER — NALOXONE HCL 0.4 MG/ML IJ SOLN: 0.4000 mg | INTRAMUSCULAR | Status: DC | PRN

## 2019-11-27 MED ORDER — KETOROLAC TROMETHAMINE 30 MG/ML IJ SOLN
INTRAMUSCULAR | Status: AC
  Filled 2019-11-27: qty 1

## 2019-11-27 MED ORDER — NALBUPHINE HCL 10 MG/ML IJ SOLN: 5.0000 mg | Freq: Once | INTRAMUSCULAR | Status: DC | PRN

## 2019-11-27 MED ORDER — LEVONORGESTREL 19.5 MCG/DAY IU IUD: INTRAUTERINE_SYSTEM | Freq: Once | INTRAUTERINE | Status: DC

## 2019-11-27 MED ORDER — OXYCODONE HCL 5 MG/5ML PO SOLN: 5.0000 mg | Freq: Once | ORAL | Status: DC | PRN

## 2019-11-27 MED ORDER — SCOPOLAMINE 1 MG/3DAYS TD PT72
MEDICATED_PATCH | TRANSDERMAL | Status: AC
  Filled 2019-11-27: qty 1

## 2019-11-27 MED ORDER — OXYTOCIN 40 UNITS IN NORMAL SALINE INFUSION - SIMPLE MED
INTRAVENOUS | Status: DC | PRN
  Administered 2019-11-27: 40 [IU] via INTRAVENOUS

## 2019-11-27 MED ORDER — KETOROLAC TROMETHAMINE 30 MG/ML IJ SOLN
INTRAMUSCULAR | Status: DC | PRN
  Administered 2019-11-27: 30 mg via INTRAVENOUS

## 2019-11-27 MED ORDER — KETOROLAC TROMETHAMINE 30 MG/ML IJ SOLN: 30.0000 mg | Freq: Once | INTRAMUSCULAR | Status: DC | PRN

## 2019-11-27 MED ORDER — EPHEDRINE SULFATE-NACL 50-0.9 MG/10ML-% IV SOSY
PREFILLED_SYRINGE | INTRAVENOUS | Status: DC | PRN
  Administered 2019-11-27: 10 mg via INTRAVENOUS

## 2019-11-27 MED ORDER — PROMETHAZINE HCL 25 MG/ML IJ SOLN: 6.2500 mg | INTRAMUSCULAR | Status: DC | PRN

## 2019-11-27 MED ORDER — DEXAMETHASONE SODIUM PHOSPHATE 10 MG/ML IJ SOLN
INTRAMUSCULAR | Status: DC | PRN
  Administered 2019-11-27: 10 mg via INTRAVENOUS

## 2019-11-27 MED ORDER — FENTANYL CITRATE (PF) 100 MCG/2ML IJ SOLN
INTRAMUSCULAR | Status: DC | PRN
  Administered 2019-11-27: 15 ug via INTRATHECAL

## 2019-11-27 MED ORDER — NIFEDIPINE ER OSMOTIC RELEASE 30 MG PO TB24
30.0000 mg | ORAL_TABLET | Freq: Every day | ORAL | Status: DC
  Administered 2019-11-27 – 2019-11-29 (×3): 30 mg via ORAL
  Filled 2019-11-27 (×3): qty 1

## 2019-11-27 MED ORDER — ONDANSETRON HCL 4 MG/2ML IJ SOLN
INTRAMUSCULAR | Status: DC | PRN
  Administered 2019-11-27: 4 mg via INTRAVENOUS

## 2019-11-27 MED ORDER — MEPERIDINE HCL 25 MG/ML IJ SOLN: 6.2500 mg | INTRAMUSCULAR | Status: DC | PRN

## 2019-11-27 MED ORDER — PHENYLEPHRINE HCL-NACL 20-0.9 MG/250ML-% IV SOLN
INTRAVENOUS | Status: DC | PRN
  Administered 2019-11-27: 60 ug/min via INTRAVENOUS

## 2019-11-27 MED ORDER — SCOPOLAMINE 1 MG/3DAYS TD PT72
1.0000 | MEDICATED_PATCH | Freq: Once | TRANSDERMAL | Status: DC
  Administered 2019-11-27: 12:00:00 1.5 mg via TRANSDERMAL

## 2019-11-27 MED ORDER — NALBUPHINE HCL 10 MG/ML IJ SOLN: 5.0000 mg | INTRAMUSCULAR | Status: DC | PRN

## 2019-11-27 MED ORDER — PHENYLEPHRINE HCL-NACL 20-0.9 MG/250ML-% IV SOLN
INTRAVENOUS | Status: AC
  Filled 2019-11-27: qty 250

## 2019-11-27 MED ORDER — OXYTOCIN 40 UNITS IN NORMAL SALINE INFUSION - SIMPLE MED: 2.5000 [IU]/h | INTRAVENOUS | Status: AC

## 2019-11-27 MED ORDER — HYDROMORPHONE HCL 1 MG/ML IJ SOLN: 0.2500 mg | INTRAMUSCULAR | Status: DC | PRN

## 2019-11-27 MED ORDER — CEFAZOLIN SODIUM-DEXTROSE 2-4 GM/100ML-% IV SOLN
INTRAVENOUS | Status: AC
  Filled 2019-11-27: qty 100

## 2019-11-27 MED ORDER — LEVONORGESTREL 19.5 MCG/DAY IU IUD
INTRAUTERINE_SYSTEM | INTRAUTERINE | Status: AC
  Filled 2019-11-27: qty 1

## 2019-11-27 MED ORDER — MENTHOL 3 MG MT LOZG: 1.0000 | LOZENGE | OROMUCOSAL | Status: DC | PRN

## 2019-11-27 MED ORDER — EPHEDRINE 5 MG/ML INJ
INTRAVENOUS | Status: AC
  Filled 2019-11-27: qty 10

## 2019-11-27 MED ORDER — SIMETHICONE 80 MG PO CHEW
80.0000 mg | CHEWABLE_TABLET | ORAL | Status: DC
  Administered 2019-11-27 – 2019-11-28 (×2): 80 mg via ORAL
  Filled 2019-11-27 (×2): qty 1

## 2019-11-27 MED ORDER — LACTATED RINGERS IV SOLN: INTRAVENOUS | Status: DC

## 2019-11-27 MED ORDER — STERILE WATER FOR IRRIGATION IR SOLN
Status: DC | PRN
  Administered 2019-11-27: 1000 mL

## 2019-11-27 MED ORDER — HYDROCODONE-ACETAMINOPHEN 5-325 MG PO TABS
1.0000 | ORAL_TABLET | ORAL | Status: DC | PRN
  Administered 2019-11-28 – 2019-11-29 (×5): 2 via ORAL
  Filled 2019-11-27 (×5): qty 2

## 2019-11-27 MED ORDER — PHENYLEPHRINE 40 MCG/ML (10ML) SYRINGE FOR IV PUSH (FOR BLOOD PRESSURE SUPPORT)
PREFILLED_SYRINGE | INTRAVENOUS | Status: DC | PRN
  Administered 2019-11-27: 80 ug via INTRAVENOUS

## 2019-11-27 MED ORDER — IBUPROFEN 800 MG PO TABS
800.0000 mg | ORAL_TABLET | Freq: Three times a day (TID) | ORAL | Status: DC
  Administered 2019-11-28 – 2019-11-29 (×3): 800 mg via ORAL
  Filled 2019-11-27 (×3): qty 1

## 2019-11-27 MED ORDER — COCONUT OIL OIL: 1.0000 "application " | TOPICAL_OIL | Status: DC | PRN

## 2019-11-27 MED ORDER — PHENYLEPHRINE HCL (PRESSORS) 10 MG/ML IV SOLN
INTRAVENOUS | Status: DC | PRN
  Administered 2019-11-27: 120 ug via INTRAVENOUS

## 2019-11-27 MED ORDER — ONDANSETRON HCL 4 MG/2ML IJ SOLN
INTRAMUSCULAR | Status: AC
  Filled 2019-11-27: qty 2

## 2019-11-27 MED ORDER — MORPHINE SULFATE (PF) 0.5 MG/ML IJ SOLN
INTRAMUSCULAR | Status: DC | PRN
  Administered 2019-11-27: 150 ug via INTRATHECAL

## 2019-11-27 MED ORDER — PHENYLEPHRINE 40 MCG/ML (10ML) SYRINGE FOR IV PUSH (FOR BLOOD PRESSURE SUPPORT)
PREFILLED_SYRINGE | INTRAVENOUS | Status: AC
  Filled 2019-11-27: qty 10

## 2019-11-27 MED ORDER — PRENATAL MULTIVITAMIN CH
1.0000 | ORAL_TABLET | Freq: Every day | ORAL | Status: DC
  Administered 2019-11-27 – 2019-11-29 (×3): 1 via ORAL
  Filled 2019-11-27 (×3): qty 1

## 2019-11-27 MED ORDER — CEFAZOLIN SODIUM-DEXTROSE 2-4 GM/100ML-% IV SOLN
2.0000 g | Freq: Once | INTRAVENOUS | Status: AC
  Administered 2019-11-27: 11:00:00 2 g via INTRAVENOUS
  Filled 2019-11-27: qty 100

## 2019-11-27 MED ORDER — ONDANSETRON HCL 4 MG/2ML IJ SOLN: 4.0000 mg | Freq: Three times a day (TID) | INTRAMUSCULAR | Status: DC | PRN

## 2019-11-27 MED ORDER — WITCH HAZEL-GLYCERIN EX PADS: 1.0000 "application " | MEDICATED_PAD | CUTANEOUS | Status: DC | PRN

## 2019-11-27 MED ORDER — SIMETHICONE 80 MG PO CHEW: 80.0000 mg | CHEWABLE_TABLET | ORAL | Status: DC | PRN

## 2019-11-27 MED ORDER — DIBUCAINE (PERIANAL) 1 % EX OINT: 1.0000 "application " | TOPICAL_OINTMENT | CUTANEOUS | Status: DC | PRN

## 2019-11-27 MED ORDER — DIPHENHYDRAMINE HCL 25 MG PO CAPS: 25.0000 mg | ORAL_CAPSULE | Freq: Four times a day (QID) | ORAL | Status: DC | PRN

## 2019-11-27 MED ORDER — ENOXAPARIN SODIUM 60 MG/0.6ML ~~LOC~~ SOLN
50.0000 mg | SUBCUTANEOUS | Status: DC
  Administered 2019-11-28: 11:00:00 50 mg via SUBCUTANEOUS
  Filled 2019-11-27 (×2): qty 0.6

## 2019-11-27 MED ORDER — SIMETHICONE 80 MG PO CHEW
80.0000 mg | CHEWABLE_TABLET | Freq: Three times a day (TID) | ORAL | Status: DC
  Administered 2019-11-27 – 2019-11-29 (×5): 80 mg via ORAL
  Filled 2019-11-27 (×5): qty 1

## 2019-11-27 MED ORDER — SODIUM CHLORIDE 0.9% FLUSH: 3.0000 mL | INTRAVENOUS | Status: DC | PRN

## 2019-11-27 MED ORDER — DIPHENHYDRAMINE HCL 50 MG/ML IJ SOLN: 12.5000 mg | INTRAMUSCULAR | Status: DC | PRN

## 2019-11-27 MED ORDER — OXYCODONE HCL 5 MG PO TABS: 5.0000 mg | ORAL_TABLET | Freq: Once | ORAL | Status: DC | PRN

## 2019-11-27 MED ORDER — OXYTOCIN 40 UNITS IN NORMAL SALINE INFUSION - SIMPLE MED
INTRAVENOUS | Status: AC
  Filled 2019-11-27: qty 1000

## 2019-11-27 MED ORDER — NALOXONE HCL 4 MG/10ML IJ SOLN
1.0000 ug/kg/h | INTRAVENOUS | Status: DC | PRN
  Filled 2019-11-27: qty 5

## 2019-11-27 MED ORDER — SODIUM CHLORIDE 0.9 % IR SOLN
Status: DC | PRN
  Administered 2019-11-27: 1000 mL

## 2019-11-27 MED ORDER — SENNOSIDES-DOCUSATE SODIUM 8.6-50 MG PO TABS
2.0000 | ORAL_TABLET | ORAL | Status: DC
  Administered 2019-11-27 – 2019-11-28 (×2): 2 via ORAL
  Filled 2019-11-27 (×2): qty 2

## 2019-11-27 MED ORDER — BUPIVACAINE IN DEXTROSE 0.75-8.25 % IT SOLN
INTRATHECAL | Status: DC | PRN
  Administered 2019-11-27: 1.6 mL via INTRATHECAL

## 2019-11-27 MED ORDER — FENTANYL CITRATE (PF) 100 MCG/2ML IJ SOLN
INTRAMUSCULAR | Status: AC
  Filled 2019-11-27: qty 2

## 2019-11-27 MED ORDER — SOD CITRATE-CITRIC ACID 500-334 MG/5ML PO SOLN
ORAL | Status: AC
  Administered 2019-11-27: 10:00:00 30 mL
  Filled 2019-11-27: qty 15

## 2019-11-27 MED ORDER — DIPHENHYDRAMINE HCL 25 MG PO CAPS: 25.0000 mg | ORAL_CAPSULE | ORAL | Status: DC | PRN

## 2019-11-27 SURGICAL SUPPLY — 44 items
APL SKNCLS STERI-STRIP NONHPOA (GAUZE/BANDAGES/DRESSINGS) ×1
BENZOIN TINCTURE PRP APPL 2/3 (GAUZE/BANDAGES/DRESSINGS) ×2 IMPLANT
CHLORAPREP W/TINT 26ML (MISCELLANEOUS) ×2 IMPLANT
CLAMP CORD UMBIL (MISCELLANEOUS) IMPLANT
CLOSURE STERI STRIP 1/2 X4 (GAUZE/BANDAGES/DRESSINGS) ×1 IMPLANT
CLOTH BEACON ORANGE TIMEOUT ST (SAFETY) ×2 IMPLANT
DRAPE C SECTION CLR SCREEN (DRAPES) IMPLANT
DRSG OPSITE POSTOP 4X10 (GAUZE/BANDAGES/DRESSINGS) ×2 IMPLANT
ELECT REM PT RETURN 9FT ADLT (ELECTROSURGICAL) ×2
ELECTRODE REM PT RTRN 9FT ADLT (ELECTROSURGICAL) ×1 IMPLANT
EXTRACTOR VACUUM M CUP 4 TUBE (SUCTIONS) IMPLANT
GLOVE BIO SURGEON STRL SZ7.5 (GLOVE) ×2 IMPLANT
GLOVE BIOGEL PI IND STRL 7.0 (GLOVE) ×1 IMPLANT
GLOVE BIOGEL PI INDICATOR 7.0 (GLOVE) ×1
GOWN STRL REUS W/TWL 2XL LVL3 (GOWN DISPOSABLE) ×2 IMPLANT
GOWN STRL REUS W/TWL LRG LVL3 (GOWN DISPOSABLE) ×4 IMPLANT
KIT ABG SYR 3ML LUER SLIP (SYRINGE) IMPLANT
NDL HYPO 25X5/8 SAFETYGLIDE (NEEDLE) IMPLANT
NEEDLE HYPO 22GX1.5 SAFETY (NEEDLE) ×2 IMPLANT
NEEDLE HYPO 25X5/8 SAFETYGLIDE (NEEDLE) IMPLANT
NS IRRIG 1000ML POUR BTL (IV SOLUTION) ×2 IMPLANT
PACK C SECTION WH (CUSTOM PROCEDURE TRAY) ×2 IMPLANT
PAD ABD 7.5X8 STRL (GAUZE/BANDAGES/DRESSINGS) ×2 IMPLANT
PAD OB MATERNITY 4.3X12.25 (PERSONAL CARE ITEMS) ×2 IMPLANT
PENCIL SMOKE EVAC W/HOLSTER (ELECTROSURGICAL) ×2 IMPLANT
RTRCTR C-SECT PINK 25CM LRG (MISCELLANEOUS) ×2 IMPLANT
SPONGE GAUZE 4X4 12PLY STER LF (GAUZE/BANDAGES/DRESSINGS) ×4 IMPLANT
STRIP CLOSURE SKIN 1/2X4 (GAUZE/BANDAGES/DRESSINGS) ×2 IMPLANT
SUT CHROMIC 1 CTX 36 (SUTURE) ×4 IMPLANT
SUT VIC AB 1 CT1 36 (SUTURE) ×4 IMPLANT
SUT VIC AB 1 CTX 36 (SUTURE) ×2
SUT VIC AB 1 CTX36XBRD ANBCTRL (SUTURE) IMPLANT
SUT VIC AB 2-0 CT1 (SUTURE) ×2 IMPLANT
SUT VIC AB 2-0 CT1 27 (SUTURE) ×2
SUT VIC AB 2-0 CT1 TAPERPNT 27 (SUTURE) ×1 IMPLANT
SUT VIC AB 3-0 CT1 27 (SUTURE) ×4
SUT VIC AB 3-0 CT1 TAPERPNT 27 (SUTURE) ×2 IMPLANT
SUT VIC AB 3-0 SH 27 (SUTURE)
SUT VIC AB 3-0 SH 27X BRD (SUTURE) IMPLANT
SUT VIC AB 4-0 KS 27 (SUTURE) ×2 IMPLANT
SYR BULB IRRIGATION 50ML (SYRINGE) IMPLANT
TOWEL OR 17X24 6PK STRL BLUE (TOWEL DISPOSABLE) ×2 IMPLANT
TRAY FOLEY W/BAG SLVR 14FR LF (SET/KITS/TRAYS/PACK) ×2 IMPLANT
WATER STERILE IRR 1000ML POUR (IV SOLUTION) ×2 IMPLANT

## 2019-11-27 NOTE — Anesthesia Postprocedure Evaluation (Signed)
Anesthesia Post Note  Patient: Tanya Moore  Procedure(s) Performed: CESAREAN SECTION AND IUD INSERTION (N/A Abdomen)     Patient location during evaluation: PACU Anesthesia Type: Spinal Level of consciousness: awake and alert Pain management: pain level controlled Vital Signs Assessment: post-procedure vital signs reviewed and stable Respiratory status: spontaneous breathing, nonlabored ventilation and respiratory function stable Cardiovascular status: blood pressure returned to baseline and stable Postop Assessment: no apparent nausea or vomiting Anesthetic complications: no    Last Vitals:  Vitals:   11/27/19 1330 11/27/19 1345  BP: 129/71 134/87  Pulse: 74 71  Resp: (!) 22 18  Temp:  36.7 C  SpO2: (!) 89% 95%    Last Pain:  Vitals:   11/27/19 1330  TempSrc:   PainSc: 0-No pain   Pain Goal: Patients Stated Pain Goal: 3 (11/27/19 0030)  LLE Motor Response: Purposeful movement (11/27/19 1330)   RLE Motor Response: Purposeful movement (11/27/19 1330)       Epidural/Spinal Function Cutaneous sensation: Able to Discern Pressure (11/27/19 1330), Patient able to flex knees: Yes (11/27/19 1330), Patient able to lift hips off bed: No (11/27/19 1330), Back pain beyond tenderness at insertion site: No (11/27/19 1330), Progressively worsening motor and/or sensory loss: No (11/27/19 1330), Bowel and/or bladder incontinence post epidural: No (11/27/19 1330)  Lowella Curb

## 2019-11-27 NOTE — Interval H&P Note (Signed)
History and Physical Interval Note:  11/27/2019 10:50 AM  Tanya Moore  has presented today for surgery, with the diagnosis of RCS IUD Insertion.  The various methods of treatment have been discussed with the patient and family. After consideration of risks, benefits and other options for treatment, the patient has consented to  Procedure(s): CESAREAN SECTION AND IUD INSERTION (N/A) as a surgical intervention.  The patient's history has been reviewed, patient examined, no change in status, stable for surgery.  I have reviewed the patient's chart and labs.  Questions were answered to the patient's satisfaction.     Hermina Staggers

## 2019-11-27 NOTE — Discharge Summary (Signed)
Postpartum Discharge Summary    Patient Name: Tanya Moore DOB: 22-Apr-1981 MRN: [id number]  Date of admission: 11/24/2019 Delivering Provider: Chancy Milroy   Date of discharge: 11/29/2019  Admitting diagnosis: Gestational hypertension [O13.9] Cesarean delivery delivered [O82] Intrauterine pregnancy: [redacted]w[redacted]d    Secondary diagnosis:  Principal Problem:   Gestational hypertension Active Problems:   Supervision of high risk pregnancy, antepartum   History of low transverse cesarean section   Cesarean delivery delivered   IUD (intrauterine device) in place  Additional problems:      Discharge diagnosis: Term Pregnancy Delivered and Gestational Hypertension                                                                                                Post partum procedures:Post-placental Liletta IUD placement  Augmentation: N/a  Complications: None  Hospital course:  Sceduled C/S   39y.o. yo [id number] admitted to the hospital 11/24/2019 for scheduled cesarean section with the following indication:Elective Repeat and gestational HTN. Admitted 11/24/2019 for gHTN with concern for possible progression to Pre-eclampsia with severe features for non-sustained severe range BP's and headache (though complicated by history of migraines). Given two doses of betamethasone and observed until 3731w0d which time she was delivered by planned repeat CS.  Membrane Rupture Time/Date: 11:35 AM ,11/27/2019   Patient delivered a Viable infant.11/27/2019    Details of operation can be found in separate operative note.  Patient had an uncomplicated postpartum course. Due to mild range BP's prior to cesarean she was started on Nifedipine 40m34m at time of transfer to postpartum floor, and her BP's were subsequently normal range.  She is ambulating, tolerating a regular diet, passing flatus, and urinating well. She was counseled to not have intercourse until 6wk PP visit with string check. Also  counseled to continue taking BP's daily, call office if >140/90, present to MAU if >160/110 or develops symptoms. Started on every other day PO iron at discharge. Patient is discharged home in stable condition on  11/29/19        Delivery time: 11:36 AM    Magnesium Sulfate received: No BMZ received: Yes Rhophylac:N/A MMR:N/A Transfusion:No  Physical exam  Vitals:   11/28/19 0530 11/28/19 1408 11/28/19 2148 11/29/19 0521  BP: 111/66 124/80 129/79 137/88  Pulse: 71 73 86 77  Resp: _0 Temp: 98.1 F (36.7 C) 97.7 F (36.5 C) 98.4 F (36.9 C) 98.1 F (36.7 C)  TempSrc: Oral Oral Oral Oral  SpO2: 96%  97%   Weight:      Height:       General: alert, cooperative and no distress Lochia: appropriate Uterine Fundus: firm Incision: Healing well with no significant drainage, small area of breakthrough at L corner DVT Evaluation: No evidence of DVT seen on physical exam. No significant calf/ankle edema. Labs: Lab Results  Component Value Date   WBC 18.7 (H) 11/28/2019   HGB 9.7 (L) 11/28/2019   HCT 28.8 (L) 11/28/2019   MCV 90.3 11/28/2019   PLT 233 11/28/2019   CMP Latest Ref Rng & Units  11/27/2019  Glucose 70 - 99 mg/dL 89  BUN 6 - 20 mg/dL <5(L)  Creatinine 0.44 - 1.00 mg/dL 0.56  Sodium 135 - 145 mmol/L 138  Potassium 3.5 - 5.1 mmol/L 3.5  Chloride 98 - 111 mmol/L 108  CO2 22 - 32 mmol/L 20(L)  Calcium 8.9 - 10.3 mg/dL 8.4(L)  Total Protein 6.5 - 8.1 g/dL 5.2(L)  Total Bilirubin 0.3 - 1.2 mg/dL 0.4  Alkaline Phos 38 - 126 U/L 100  AST 15 - 41 U/L 15  ALT 0 - 44 U/L 11   Edinburgh Score: Edinburgh Postnatal Depression Scale Screening Tool 11/27/2019  I have been able to laugh and see the funny side of things. 0  I have looked forward with enjoyment to things. 0  I have blamed myself unnecessarily when things went wrong. 1  I have been anxious or worried for no good reason. 2  I have felt scared or panicky for no good reason. 1  Things have been getting  on top of me. 1  I have been so unhappy that I have had difficulty sleeping. 0  I have felt sad or miserable. 0  I have been so unhappy that I have been crying. 0  The thought of harming myself has occurred to me. 0  Edinburgh Postnatal Depression Scale Total 5    Discharge instruction: per After Visit Summary and "Baby and Me Booklet".  After visit meds:  Allergies as of 11/29/2019      Reactions   Lamictal [lamotrigine] Rash   Patient reported      Medication List    STOP taking these medications   Magnesium Oxide 500 MG Caps     TAKE these medications   acetaminophen 325 MG tablet Commonly known as: TYLENOL Take 2 tablets (650 mg total) by mouth every 4 (four) hours as needed (for pain scale < 4  OR  temperature  >/=  100.5 F).   Blood Pressure Kit Devi 1 Device by Does not apply route as needed.   Calcium + D3 600-200 MG-UNIT Tabs Take 1 tablet by mouth daily.   ferrous sulfate 325 (65 FE) MG tablet Take 1 tablet (325 mg total) by mouth every other day. Start taking on: November 30, 2019   ibuprofen 800 MG tablet Commonly known as: ADVIL Take 1 tablet (800 mg total) by mouth every 8 (eight) hours.   NIFEdipine 30 MG 24 hr tablet Commonly known as: ADALAT CC Take 1 tablet (30 mg total) by mouth daily.   oxyCODONE 5 MG immediate release tablet Commonly known as: Roxicodone Take 1 tablet (5 mg total) by mouth every 4 (four) hours as needed (for severe pain).   polyethylene glycol powder 17 GM/SCOOP powder Commonly known as: GLYCOLAX/MIRALAX Take 17 g by mouth daily as needed.   prenatal multivitamin Tabs tablet Take 1 tablet by mouth daily at 12 noon.       Diet: routine diet  Activity: Advance as tolerated. Pelvic rest for 6 weeks.   Outpatient follow up:6 weeks Follow up Appt: Future Appointments  Date Time Provider [place]  12/05/2019 10:00 AM [facility]  12/06/2019  9:00 AM [place]  01/01/2020   3:15 PM Darlina Rumpf, CNM [facility] [place]  04/30/2020 10:30 AM Ralene Bathe, MD [facility] None   Follow up Visit:   Please schedule this patient for Postpartum visit in: 6 weeks with the following provider: Any provider In-Person For C/S patients schedule nurse  incision check in weeks 2 weeks: yes High risk pregnancy complicated by: gHTN Delivery mode:  CS Anticipated Birth Control:  PP IUD Placed PP Procedures needed: Incision check, BP check, IUD string check  Schedule Integrated BH visit: n   Newborn Data: Live born female  Birth Weight: 3170 grams APGAR: 16, 9  Newborn Delivery   Birth date/time: 11/27/2019 11:36:00 Delivery type: C-Section, Low Transverse Trial of labor: No C-section categorization: Repeat      Baby Feeding: Breast Disposition:home with mother   11/29/2019 Clarnce Flock, MD

## 2019-11-27 NOTE — Transfer of Care (Signed)
Immediate Anesthesia Transfer of Care Note  Patient: Tanya Moore  Procedure(s) Performed: CESAREAN SECTION AND IUD INSERTION (N/A Abdomen)  Patient Location: PACU  Anesthesia Type:Spinal  Level of Consciousness: awake  Airway & Oxygen Therapy: Patient Spontanous Breathing  Post-op Assessment: Report given to RN  Post vital signs: Reviewed and stable  Last Vitals:  Vitals Value Taken Time  BP 121/76 11/27/19 1217  Temp    Pulse 74 11/27/19 1220  Resp 16 11/27/19 1220  SpO2 96 % 11/27/19 1220  Vitals shown include unvalidated device data.  Last Pain:  Vitals:   11/27/19 0817  TempSrc: Oral  PainSc:       Patients Stated Pain Goal: 3 (11/27/19 0030)  Complications: No apparent anesthesia complications

## 2019-11-27 NOTE — Progress Notes (Signed)
Patient ID: Tanya Moore, female   DOB: 1981-06-25, 39 y.o.   MRN: [id number] FACULTY PRACTICE ANTEPARTUM(COMPREHENSIVE) NOTE  Tanya Moore is a 39 y.o. [id number] at [redacted]w[redacted]d by best clinical estimate who is admitted for gestational hypertension.   Fetal presentation is cephalic. Length of Stay:  2  Days  ASSESSMENT: Principal Problem:   Gestational hypertension Active Problems:   Supervision of high risk pregnancy, antepartum   History of low transverse cesarean section   PLAN: For RCS today with IUD placement - desires levonorgestrel IUD. She has completed BMZ course. BP's are creeping up with highest yesterday of 161/85. Labs are normal. Will monitor for severe features, Magnesium if indicated. Strongly consider BP meds pp. Has pre-op orders in signed and held orders Risks include but are not limited to bleeding, infection, injury to surrounding structures, including bowel, bladder and ureters, blood clots, and death.  Likelihood of success is high.   Subjective: Feels well. No headache today. Denies vision changes or RUQ pain. Patient reports the fetal movement as active. Patient reports uterine contraction  activity as none. Patient reports  vaginal bleeding as none. Patient describes fluid per vagina as None.  Vitals:  Blood pressure (!) 146/85, pulse 78, temperature 98.7 F (37.1 C), temperature source Oral, resp. rate 18, height 5\' 4"  (1.626 m), weight 94.8 kg, last menstrual period 03/13/2019, SpO2 100 %, unknown if currently breastfeeding. Physical Examination:  General appearance - alert, well appearing, and in no distress Chest - normal effort Abdomen - gravid, non-tender Fundal Height:  size equals dates Extremities: Homans sign is negative, no sign of DVT  Membranes:intact  Fetal Monitoring:  Baseline: 125 bpm, Variability: Good {> 6 bpm), Accelerations: Reactive and Decelerations: Absent  Labs:  Results for orders placed or performed during the hospital encounter  of 11/24/19 (from the past 24 hour(s))  CBC   Collection Time: 11/27/19  5:27 AM  Result Value Ref Range   WBC 11.7 (H) 4.0 - 10.5 K/uL   RBC 3.50 (L) 3.87 - 5.11 MIL/uL   Hemoglobin 10.8 (L) 12.0 - 15.0 g/dL   HCT 11/29/19 (L) 46.6 - 59.9 %   MCV 91.7 80.0 - 100.0 fL   MCH 30.9 26.0 - 34.0 pg   MCHC 33.6 30.0 - 36.0 g/dL   RDW 35.7 01.7 - 79.3 %   Platelets 210 150 - 400 K/uL   nRBC 0.3 (H) 0.0 - 0.2 %  Comprehensive metabolic panel   Collection Time: 11/27/19  5:27 AM  Result Value Ref Range   Sodium 138 135 - 145 mmol/L   Potassium 3.5 3.5 - 5.1 mmol/L   Chloride 108 98 - 111 mmol/L   CO2 20 (L) 22 - 32 mmol/L   Glucose, Bld 89 70 - 99 mg/dL   BUN <5 (L) 6 - 20 mg/dL   Creatinine, Ser 11/29/19 0.44 - 1.00 mg/dL   Calcium 8.4 (L) 8.9 - 10.3 mg/dL   Total Protein 5.2 (L) 6.5 - 8.1 g/dL   Albumin 2.3 (L) 3.5 - 5.0 g/dL   AST 15 15 - 41 U/L   ALT 11 0 - 44 U/L   Alkaline Phosphatase 100 38 - 126 U/L   Total Bilirubin 0.4 0.3 - 1.2 mg/dL   GFR calc non Af Amer >60 >60 mL/min   GFR calc Af Amer >60 >60 mL/min   Anion gap 10 5 - 15  Type and screen [facility]   Collection Time: 11/27/19  5:27 AM  Result Value Ref Range   ABO/RH(D) O POS    Antibody Screen NEG    Sample Expiration      11/30/2019,2359 Performed at [facility], [place] [address], [place],  [id number]      Medications:  Scheduled . docusate sodium  100 mg Oral Daily  . prenatal multivitamin  1 tablet Oral Q1200  . sodium chloride flush  3 mL Intravenous Q12H   I have reviewed the patient's current medications.   Donnamae Jude, MD 11/27/2019,8:55 AM

## 2019-11-27 NOTE — Lactation Note (Signed)
This note was copied from a baby's chart. Lactation Consultation Note Baby 12 hrs old. Mom holding baby STS baby sleeping soundly. Mom stated latching going OK. Stated RN helped latched. Encouraged mom to call for [facility] assistance for next feeding. Mom exclusively pumped for 9 months for her 1st child. Mom stated he wouldn't latch.  Mom encouraged to feed baby 8-12 times/24 hours and with feeding cues. Mom encouraged to waken baby for feedings if hasn't cued in 3 hrs.  Newborn behavior, STS, I&O, feeding habits discussed. Mom stated she was very tired. Encouraged mom to call for feeding assistance if needed. Lactation brochure given.  Patient Name: Tanya Moore Today's Date: 11/27/2019 Reason for consult: Initial assessment;Early term 37-38.6wks   Maternal Data Formula Feeding for Exclusion: No Has patient been taught Hand Expression?: No Does the patient have breastfeeding experience prior to this delivery?: Yes  Feeding Feeding Type: Breast Fed  LATCH Score Latch: Too sleepy or reluctant, no latch achieved, no sucking elicited.  Audible Swallowing: None  Type of Nipple: Everted at rest and after stimulation  Comfort (Breast/Nipple): Soft / non-tender  Hold (Positioning): No assistance needed to correctly position infant at breast.  LATCH Score: 6  Interventions Interventions: Breast feeding basics reviewed  Lactation Tools Discussed/Used WIC Program: Yes   Consult Status Consult Status: Follow-up Date: 11/28/19 Follow-up type: In-patient    Charyl Dancer 11/27/2019, 11:57 PM

## 2019-11-27 NOTE — Op Note (Signed)
Cesarean Section Procedure Note  11/24/2019 - 11/27/2019  12:06 PM  PATIENT:  Tanya Moore  39 y.o. female  PRE-OPERATIVE DIAGNOSIS:  RCS IUD Insertion  POST-OPERATIVE DIAGNOSIS:  RCS IUD Insertion  PROCEDURE:  Procedure(s): CESAREAN SECTION AND IUD INSERTION (N/A)  SURGEON:  Surgeon(s) and Role:    * Hermina Staggers, MD - Primary    * Venora Maples, MD - Fellow  ASSISTANTS: none   ANESTHESIA:   spinal  EBL:  Total I/O In: 1533.9 [I.V.:1533.9] Out: 476 [Urine:200; Blood:276]  BLOOD ADMINISTERED:none  DRAINS: none   LOCAL MEDICATIONS USED:  NONE  SPECIMEN:  Source of Specimen:  placenta  DISPOSITION OF SPECIMEN:  PATHOLOGY   Procedure Details   The patient was seen in the Holding Room. The risks, benefits, complications, treatment options, and expected outcomes were discussed with the patient.  The patient concurred with the proposed plan, giving informed consent.  The site of surgery properly noted/marked. The patient was taken to the operating room, identified as Tanya Moore and the procedure verified as C-Section Delivery. A Time Out was held and the above information confirmed.  After induction of anesthesia, the patient was draped and prepped in the usual sterile manner. A Pfannenstiel incision was made through the existing scar and carried down through the subcutaneous tissue to the fascia. Fascial incision was made and extended transversely. The fascia was separated from the underlying rectus tissue superiorly and inferiorly. The peritoneum was identified and entered. Peritoneal incision was extended longitudinally. The utero-vesical peritoneal reflection was incised transversely and the bladder flap was bluntly freed from the lower uterine segment. A low transverse uterine incision was made. Delivered from cephalic presentation was a 3170 gram Female with Apgar scores of 9 at one minute and 9 at five minutes. After the umbilical cord was clamped and cut  cord blood was obtained for evaluation. The placenta was removed intact and appeared normal. The uterine outline was normal, the left tube and ovary appeared normal, the right tube was surgically absent and the right ovary appeared normal. A Liletta IUD was placed at the uterine fundus, and the strings were placed through the cervical os with a long Kelly clamp. The uterine incision was closed with running locked sutures of 1-0 Chromic, and then an imbricating layer of the same was placed. Hemostasis was observed. The peritoneum was reapproximated with a running suture of 2-0 Vicryl, and the rectus muscles were reapproximated with a single interrupted suture of Chromic. The fascia was then reapproximated with running sutures of 0 Vicryl. The skin was reapproximated with a subcuticular suture of 4-0 Vircyl.  Instrument, sponge, and needle counts were correct prior the abdominal closure and at the conclusion of the case.   Complications:  None; patient tolerated the procedure well.  COUNTS:  YES  PLAN OF CARE: Routine postpartum couplet care  PATIENT DISPOSITION:  PACU - hemodynamically stable.   Delay start of Pharmacological VTE agent (>24hrs) due to surgical blood loss or risk of bleeding: yes      Venora Maples, MD 11/27/2019 12:06 PM

## 2019-11-27 NOTE — Anesthesia Procedure Notes (Signed)
Spinal  Patient location during procedure: OB Start time: 11/27/2019 11:10 AM End time: 11/27/2019 11:15 AM Staffing Performed: anesthesiologist  Anesthesiologist: Lowella Curb, MD Preanesthetic Checklist Completed: patient identified, IV checked, risks and benefits discussed, surgical consent, monitors and equipment checked, pre-op evaluation and timeout performed Spinal Block Patient position: sitting Prep: DuraPrep and site prepped and draped Patient monitoring: heart rate, cardiac monitor, continuous pulse ox and blood pressure Approach: midline Location: L3-4 Injection technique: single-shot Needle Needle type: Pencan  Needle gauge: 24 G Needle length: 10 cm Assessment Sensory level: T4 Additional Notes SAB attempted by SRNA.  Block placed by anesthesiologist.

## 2019-11-27 NOTE — Lactation Note (Signed)
This note was copied from a baby's chart. Lactation Consultation Note  Patient Name: Tanya Moore Today's Date: 11/27/2019 Reason for consult: Initial assessment;Early term 61-38.6wks  Baby is 37 hours old ETI of P2 mother. Mother stated she did not breastfeed but pumped for 8 month for her first child. First baby had a medical reason and was not able to latch.   Baby showed hunger cues during visit. Assisted mother with a football latch on right breast.  Baby fell asleep as soon as mother put her breast. Mother reported she had a good feed 1 hour before. Discussed hand expression and mother is familiar with technique.   Questions answered. Discussed milk coming to volume. Instructed to feed baby following hunger cues and to call for assistance when needed. Breastfeeding consultation and services information given and reviewed.    Maternal Data Formula Feeding for Exclusion: No Has patient been taught Hand Expression?: No Does the patient have breastfeeding experience prior to this delivery?: Yes(Patient pumped for 8 months for first child)  Feeding Feeding Type: Breast Fed  LATCH Score Latch: Too sleepy or reluctant, no latch achieved, no sucking elicited.  Audible Swallowing: None  Type of Nipple: Everted at rest and after stimulation  Comfort (Breast/Nipple): Soft / non-tender  Hold (Positioning): No assistance needed to correctly position infant at breast.  LATCH Score: 6  Interventions Interventions: Breast feeding basics reviewed    Consult Status Consult Status: Follow-up Date: 11/28/19 Follow-up type: In-patient    Cammeron Greis A Higuera Ancidey 11/27/2019, 11:33 PM

## 2019-11-27 NOTE — Anesthesia Preprocedure Evaluation (Signed)
Anesthesia Evaluation  Patient identified by MRN, date of birth, ID band Patient awake    Reviewed: Allergy & Precautions, NPO status , Patient's Chart, lab work & pertinent test results  History of Anesthesia Complications Negative for: history of anesthetic complications  Airway Mallampati: I  TM Distance: >3 FB Neck ROM: Full    Dental  (+) Dental Advisory Given, Teeth Intact   Pulmonary neg recent URI, former smoker,    breath sounds clear to auscultation       Cardiovascular hypertension, Pt. on medications negative cardio ROS   Rhythm:Regular     Neuro/Psych  Headaches, PSYCHIATRIC DISORDERS Anxiety Depression Bipolar Disorder    GI/Hepatic negative GI ROS, Neg liver ROS,   Endo/Other  negative endocrine ROS  Renal/GU negative Renal ROS     Musculoskeletal   Abdominal (+) + obese,   Peds  Hematology negative hematology ROS (+)   Anesthesia Other Findings   Reproductive/Obstetrics (+) Pregnancy Intrauterine pregnancy with ectopic rupture per [place]                             Anesthesia Physical  Anesthesia Plan  ASA: II  Anesthesia Plan: Spinal   Post-op Pain Management:    Induction:   PONV Risk Score and Plan: 2 and Ondansetron and Treatment may vary due to age or medical condition  Airway Management Planned: Natural Airway  Additional Equipment: None  Intra-op Plan:   Post-operative Plan:   Informed Consent: I have reviewed the patients History and Physical, chart, labs and discussed the procedure including the risks, benefits and alternatives for the proposed anesthesia with the patient or authorized representative who has indicated his/her understanding and acceptance.     Dental advisory given  Plan Discussed with: CRNA and Surgeon  Anesthesia Plan Comments:         Anesthesia Quick Evaluation

## 2019-11-28 ENCOUNTER — Encounter (HOSPITAL_COMMUNITY): Payer: Self-pay

## 2019-11-28 ENCOUNTER — Encounter: Payer: Self-pay | Admitting: *Deleted

## 2019-11-28 ENCOUNTER — Ambulatory Visit (HOSPITAL_COMMUNITY): Payer: Medicaid Other

## 2019-11-28 LAB — CBC
HCT: 28.8 % — ABNORMAL LOW (ref 36.0–46.0)
Hemoglobin: 9.7 g/dL — ABNORMAL LOW (ref 12.0–15.0)
MCH: 30.4 pg (ref 26.0–34.0)
MCHC: 33.7 g/dL (ref 30.0–36.0)
MCV: 90.3 fL (ref 80.0–100.0)
Platelets: 233 10*3/uL (ref 150–400)
RBC: 3.19 MIL/uL — ABNORMAL LOW (ref 3.87–5.11)
RDW: 13.1 % (ref 11.5–15.5)
WBC: 18.7 10*3/uL — ABNORMAL HIGH (ref 4.0–10.5)
nRBC: 0.2 % (ref 0.0–0.2)

## 2019-11-28 MED ORDER — FERROUS SULFATE 325 (65 FE) MG PO TABS
325.0000 mg | ORAL_TABLET | ORAL | Status: DC
  Administered 2019-11-28: 11:00:00 325 mg via ORAL
  Filled 2019-11-28: qty 1

## 2019-11-28 NOTE — Clinical Social Work Maternal (Signed)
CLINICAL SOCIAL WORK MATERNAL/CHILD NOTE  Patient Details  Name: Tanya Moore MRN: [id number] Date of Birth: 1980-11-14  Date:  11/28/2019  Clinical Social Worker Initiating Note:  Durward Fortes, LCSW Date/Time: Initiated:  11/28/19/1000     Child's Name:  Tanya Moore   Biological Parents:  Mother, Father(Tanya Moore and Tanya Moore)   Need for Interpreter:    none   Reason for Referral:    Behavioral Health Concerns   Address:  [address] [place] [address]    Phone number:  [phone] (home)     Additional phone number: none   Household Members/Support Persons (HM/SP):   Household Member/Support Person 2   HM/SP Name Relationship DOB or Age  HM/SP -Tanya Moore  FOB     HM/SP -Tanya Moore    HM/SP -Tanya Moore  son   47 years old   HM/SP -51        HM/SP -5        HM/SP -6        HM/SP -7        HM/SP -8          Natural Supports (not living in the home):  Friends, Artist Supports: None   Employment: Unemployed   Type of Work: none   Education:  [place] arranged:  n/a  Museum/gallery curator Resources:  Medicaid   Other Resources:  Physicist, medical , [place] Considerations Which May Impact Care:  none reported.   Strengths:  Ability to meet basic needs , Compliance with medical plan , Home prepared for child , Pediatrician chosen   Psychotropic Medications:      None    Pediatrician:    [place] area  Pediatrician List:   [facility] for [place]      Pediatrician Fax Number:    Risk Factors/Current Problems:  None   Cognitive State:  Able to Concentrate , Insightful , Alert    Mood/Affect:  Interested , Happy , Relaxed , Comfortable , Calm    CSW Assessment: CSW consulted as Moore has a hx of Bipolar, Anxiety, and Depression .CSW went to speak with Moore at  bedside to address further needs.   VCSW congratulated Moore on the birth of infant. CSW advised Moore of the HIPPA policy as CSW observed that FOB was in room holding infant. Moore reported that it was fine for FOB to remain in the room while CSW spoke with her. CSW understanding and proceeded with assessing Moore.  CSW advised Moore of the reason for CSW coming to visit with her. Moore reports that she was diagnosed with anxiety and depression in the early 2000's. Moore expressed that she was once on medication for her Mental health but stopped all medications once pregnant. Moore reports that she was diagnosed with Bipolar in 2016 and was on medications for this as well. Moore declined being in therapy as Moore reports that therapy never worked well with her work schedule and taking care of her other child. CSW offered Moore resources for therapy and medication management and Moore reports that she wasn't interested at this time. CSW understanding. CSW assessed Moore for SI and HI in which Moore declined having. Moore reported no other needs regarding Mental health.   CSW was  advised that Moore has support from FOB and her mom and friends. CSW was advised that Moore has all needed items to care for infant with no other needs at this time.   CSW provided Moore with PPD and SIDS education. Moore was given PPD Checklist in order to keep track of her feelings as they relate to PPD.   CSW Plan/Description:  No Further Intervention Required/No Barriers to Discharge, Perinatal Mood and Anxiety Disorder (PMADs) Education, Sudden Infant Death Syndrome (SIDS) Education    Robb Matar, LCSWA 11/28/2019, 10:31 AM

## 2019-11-28 NOTE — Progress Notes (Signed)
POSTPARTUM PROGRESS NOTE  POD #1  Subjective:  Tanya Moore is a 38 y.o. [id number] s/p rLTCS at [redacted]w[redacted]d.  She reports she doing well. No acute events overnight.. She denies any problems with ambulating or po intake. Denies nausea or vomiting. She has  passed flatus. Foley removed recently, has not yet needed to urinate. Pain is well controlled.  Lochia is less than a period. Reports headache but says she always has a headache and it is no different from her usual migraines. Denies chest pain, SOB, RUQ pain, vision changes, LE edema.  Objective: Blood pressure 111/66, pulse 71, temperature 98.1 F (36.7 C), temperature source Oral, resp. rate 18, height 5\' 4"  (1.626 m), weight 94.8 kg, last menstrual period 03/13/2019, SpO2 96 %, unknown if currently breastfeeding.  Physical Exam:  General: alert, cooperative and no distress Chest: no respiratory distress Heart:regular rate, distal pulses intact Abdomen: soft, nontender,  Uterine Fundus: firm, appropriately tender DVT Evaluation: No calf swelling or tenderness Extremities: no LE edema Skin: warm, dry; incision clean/dry/intact w/ honeycomb dressing in place  Recent Labs    11/27/19 0527 11/28/19 0559  HGB 10.8* 9.7*  HCT 32.1* 28.8*    Assessment/Plan: Tanya Moore is a 39 y.o. 24 s/p rLTCS at [redacted]w[redacted]d for elective repeat with gHTN.  POD#1 - Doing welll; pain  controlled. H/H appropriate  Routine postpartum care  OOB, ambulated  Lovenox for VTE prophylaxis Anemia: asymptomatic  Start po ferrous sulfate every other day gHTN: started on Nifed 30 XL after delivery, patient with HA but known migraines and otherwise asymptomatic with excellent BP. Consider switch to alternative agent for BP pending course of HA. Contraception: Post placental IUD placed at time of cesarean Feeding: breast  Dispo: Plan for discharge POD#2-3.   LOS: 3 days   [redacted]w[redacted]d, MD/MPH OB Fellow  11/28/2019, 7:18 AM

## 2019-11-28 NOTE — Plan of Care (Signed)
Doing well, ambulating in hallway and room, breastfeeding well, bonding with baby and blood pressures have been WNL.

## 2019-11-29 ENCOUNTER — Other Ambulatory Visit: Payer: Medicaid Other

## 2019-11-29 ENCOUNTER — Encounter: Payer: Medicaid Other | Admitting: Obstetrics and Gynecology

## 2019-11-29 LAB — SURGICAL PATHOLOGY

## 2019-11-29 MED ORDER — NIFEDIPINE ER 30 MG PO TB24: 30.0000 mg | ORAL_TABLET | Freq: Every day | ORAL | 5 refills | Status: DC

## 2019-11-29 MED ORDER — ACETAMINOPHEN 325 MG PO TABS: 650.0000 mg | ORAL_TABLET | ORAL | 0 refills | Status: DC | PRN

## 2019-11-29 MED ORDER — OXYCODONE HCL 5 MG PO TABS: 5.0000 mg | ORAL_TABLET | ORAL | 0 refills | Status: DC | PRN

## 2019-11-29 MED ORDER — FERROUS SULFATE 325 (65 FE) MG PO TABS: 325.0000 mg | ORAL_TABLET | ORAL | 2 refills | Status: DC

## 2019-11-29 MED ORDER — POLYETHYLENE GLYCOL 3350 17 GM/SCOOP PO POWD: 17.0000 g | Freq: Every day | ORAL | 1 refills | Status: DC | PRN

## 2019-11-29 MED ORDER — IBUPROFEN 800 MG PO TABS: 800.0000 mg | ORAL_TABLET | Freq: Three times a day (TID) | ORAL | 0 refills | Status: DC

## 2019-11-29 MED FILL — POLYETHYLENE GLYCOL 3350 PO: 17 | 30 days supply | Qty: 510 | Fill #0

## 2019-11-29 MED FILL — oxyCODONE HCL 5 MG TABS: 5 | 3 days supply | Qty: 20 | Fill #0

## 2019-11-29 MED FILL — FERROUS SULFATE 325 MG TAB: 325 (65 FE) | 60 days supply | Qty: 30 | Fill #0

## 2019-11-29 MED FILL — NIFEdipine ER 30 MG TB24: 30 | 30 days supply | Qty: 30 | Fill #0

## 2019-11-29 MED FILL — IBUPROFEN 800 MG TAB: 800 | 10 days supply | Qty: 30 | Fill #0

## 2019-11-29 MED FILL — ACETAMINOPHEN 325 MG TABS: 325 | 4 days supply | Qty: 30 | Fill #0

## 2019-11-29 NOTE — Discharge Instructions (Signed)
Cesarean Delivery, Care After This sheet gives you information about how to care for yourself after your procedure. Your health care provider may also give you more specific instructions. If you have problems or questions, contact your health care provider. What can I expect after the procedure? After the procedure, it is common to have:  A small amount of blood or clear fluid coming from the incision.  Some redness, swelling, and pain in your incision area.  Some abdominal pain and soreness.  Vaginal bleeding (lochia). Even though you did not have a vaginal delivery, you will still have vaginal bleeding and discharge.  Pelvic cramps.  Fatigue. You may have pain, swelling, and discomfort in the tissue between your vagina and your anus (perineum) if:  Your C-section was unplanned, and you were allowed to labor and push.  An incision was made in the area (episiotomy) or the tissue tore during attempted vaginal delivery. Follow these instructions at home: Incision care   Follow instructions from your health care provider about how to take care of your incision. Make sure you: ? Wash your hands with soap and water before you change your bandage (dressing). If soap and water are not available, use hand sanitizer. ? If you have a dressing, change it or remove it as told by your health care provider. ? Leave stitches (sutures), skin staples, skin glue, or adhesive strips in place. These skin closures may need to stay in place for 2 weeks or longer. If adhesive strip edges start to loosen and curl up, you may trim the loose edges. Do not remove adhesive strips completely unless your health care provider tells you to do that.  Check your incision area every day for signs of infection. Check for: ? More redness, swelling, or pain. ? More fluid or blood. ? Warmth. ? Pus or a bad smell.  Do not take baths, swim, or use a hot tub until your health care provider says it's okay. Ask your health  care provider if you can take showers.  When you cough or sneeze, hug a pillow. This helps with pain and decreases the chance of your incision opening up (dehiscing). Do this until your incision heals. Medicines  Take over-the-counter and prescription medicines only as told by your health care provider.  If you were prescribed an antibiotic medicine, take it as told by your health care provider. Do not stop taking the antibiotic even if you start to feel better.  Do not drive or use heavy machinery while taking prescription pain medicine. Lifestyle  Do not drink alcohol. This is especially important if you are breastfeeding or taking pain medicine.  Do not use any products that contain nicotine or tobacco, such as cigarettes, e-cigarettes, and chewing tobacco. If you need help quitting, ask your health care provider. Eating and drinking  Drink at least 8 eight-ounce glasses of water every day unless told not to by your health care provider. If you breastfeed, you may need to drink even more water.  Eat high-fiber foods every day. These foods may help prevent or relieve constipation. High-fiber foods include: ? Whole grain cereals and breads. ? Brown rice. ? Beans. ? Fresh fruits and vegetables. Activity   If possible, have someone help you care for your baby and help with household activities for at least a few days after you leave the hospital.  Return to your normal activities as told by your health care provider. Ask your health care provider what activities are safe for   you.  Rest as much as possible. Try to rest or take a nap while your baby is sleeping.  Do not lift anything that is heavier than 10 lbs (4.5 kg), or the limit that you were told, until your health care provider says that it is safe.  Talk with your health care provider about when you can engage in sexual activity. This may depend on your: ? Risk of infection. ? How fast you heal. ? Comfort and desire to  engage in sexual activity. General instructions  Do not use tampons or douches until your health care provider approves.  Wear loose, comfortable clothing and a supportive and well-fitting bra.  Keep your perineum clean and dry. Wipe from front to back when you use the toilet.  If you pass a blood clot, save it and call your health care provider to discuss. Do not flush blood clots down the toilet before you get instructions from your health care provider.  Keep all follow-up visits for you and your baby as told by your health care provider. This is important. Contact a health care provider if:  You have: ? A fever. ? Bad-smelling vaginal discharge. ? Pus or a bad smell coming from your incision. ? Difficulty or pain when urinating. ? A sudden increase or decrease in the frequency of your bowel movements. ? More redness, swelling, or pain around your incision. ? More fluid or blood coming from your incision. ? A rash. ? Nausea. ? Little or no interest in activities you used to enjoy. ? Questions about caring for yourself or your baby.  Your incision feels warm to the touch.  Your breasts turn red or become painful or hard.  You feel unusually sad or worried.  You vomit.  You pass a blood clot from your vagina.  You urinate more than usual.  You are dizzy or light-headed. Get help right away if:  You have: ? Pain that does not go away or get better with medicine. ? Chest pain. ? Difficulty breathing. ? Blurred vision or spots in your vision. ? Thoughts about hurting yourself or your baby. ? New pain in your abdomen or in one of your legs. ? A severe headache.  You faint.  You bleed from your vagina so much that you fill more than one sanitary pad in one hour. Bleeding should not be heavier than your heaviest period. Summary  After the procedure, it is common to have pain at your incision site, abdominal cramping, and slight bleeding from your vagina.  Check  your incision area every day for signs of infection.  Tell your health care provider about any unusual symptoms.  Keep all follow-up visits for you and your baby as told by your health care provider. This information is not intended to replace advice given to you by your health care provider. Make sure you discuss any questions you have with your health care provider. Document Revised: 02/02/2018 Document Reviewed: 02/02/2018 Elsevier Patient Education  2020 [employer]    Postpartum Hypertension Postpartum hypertension is high blood pressure that remains higher than normal after childbirth. You may not realize that you have postpartum hypertension if your blood pressure is not being checked regularly. In most cases, postpartum hypertension will go away on its own, usually within a week of delivery. However, for some women, medical treatment is required to prevent serious complications, such as seizures or stroke. What are the causes? This condition may be caused by one or more of the   following:  Hypertension that existed before pregnancy (chronic hypertension).  Hypertension that comes on as a result of pregnancy (gestational hypertension).  Hypertensive disorders during pregnancy (preeclampsia) or seizures in women who have high blood pressure during pregnancy (eclampsia).  A condition in which the liver, platelets, and red blood cells are damaged during pregnancy (HELLP syndrome).  A condition in which the thyroid produces too much hormones (hyperthyroidism).  Other rare problems of the nerves (neurological disorders) or blood disorders. In some cases, the cause may not be known. What increases the risk? The following factors may make you more likely to develop this condition:  Chronic hypertension. In some cases, this may not have been diagnosed before pregnancy.  Obesity.  Type 2 diabetes.  Kidney disease.  History of preeclampsia or eclampsia.  Other medical  conditions that change the level of hormones in the body (hormonal imbalance). What are the signs or symptoms? As with all types of hypertension, postpartum hypertension may not have any symptoms. Depending on how high your blood pressure is, you may experience:  Headaches. These may be mild, moderate, or severe. They may also be steady, constant, or sudden in onset (thunderclap headache).  Changes in your ability to see (visual changes).  Dizziness.  Shortness of breath.  Swelling of your hands, feet, lower legs, or face. In some cases, you may have swelling in more than one of these locations.  Heart palpitations or a racing heartbeat.  Difficulty breathing while lying down.  Decrease in the amount of urine that you pass. Other rare signs and symptoms may include:  Sweating more than usual. This lasts longer than a few days after delivery.  Chest pain.  Sudden dizziness when you get up from sitting or lying down.  Seizures.  Nausea or vomiting.  Abdominal pain. How is this diagnosed? This condition may be diagnosed based on the results of a physical exam, blood pressure measurements, and blood and urine tests. You may also have other tests, such as a CT scan or an MRI, to check for other problems of postpartum hypertension. How is this treated? If blood pressure is high enough to require treatment, your options may include:  Medicines to reduce blood pressure (antihypertensives). Tell your health care provider if you are breastfeeding or if you plan to breastfeed. There are many antihypertensive medicines that are safe to take while breastfeeding.  Stopping medicines that may be causing hypertension.  Treating medical conditions that are causing hypertension.  Treating the complications of hypertension, such as seizures, stroke, or kidney problems. Your health care provider will also continue to monitor your blood pressure closely until it is within a safe range for  you. Follow these instructions at home:  Take over-the-counter and prescription medicines only as told by your health care provider.  Return to your normal activities as told by your health care provider. Ask your health care provider what activities are safe for you.  Do not use any products that contain nicotine or tobacco, such as cigarettes and e-cigarettes. If you need help quitting, ask your health care provider.  Keep all follow-up visits as told by your health care provider. This is important. Contact a health care provider if:  Your symptoms get worse.  You have new symptoms, such as: ? A headache that does not get better. ? Dizziness. ? Visual changes. Get help right away if:  You suddenly develop swelling in your hands, ankles, or face.  You have sudden, rapid weight gain.  You develop   difficulty breathing, chest pain, racing heartbeat, or heart palpitations.  You develop severe pain in your abdomen.  You have any symptoms of a stroke. "BE FAST" is an easy way to remember the main warning signs of a stroke: ? B - Balance. Signs are dizziness, sudden trouble walking, or loss of balance. ? E - Eyes. Signs are trouble seeing or a sudden change in vision. ? F - Face. Signs are sudden weakness or numbness of the face, or the face or eyelid drooping on one side. ? A - Arms. Signs are weakness or numbness in an arm. This happens suddenly and usually on one side of the body. ? S - Speech. Signs are sudden trouble speaking, slurred speech, or trouble understanding what people say. ? T - Time. Time to call emergency services. Write down what time symptoms started.  You have other signs of a stroke, such as: ? A sudden, severe headache with no known cause. ? Nausea or vomiting. ? Seizure. These symptoms may represent a serious problem that is an emergency. Do not wait to see if the symptoms will go away. Get medical help right away. Call your local emergency services (911 in  the [place] Do not drive yourself to the hospital. Summary  Postpartum hypertension is high blood pressure that remains higher than normal after childbirth.  In most cases, postpartum hypertension will go away on its own, usually within a week of delivery.  For some women, medical treatment is required to prevent serious complications, such as seizures or stroke. This information is not intended to replace advice given to you by your health care provider. Make sure you discuss any questions you have with your health care provider. Document Revised: 09/02/2018 Document Reviewed: 05/17/2017 Elsevier Patient Education  2020 [employer] Inc.  

## 2019-11-29 NOTE — Lactation Note (Signed)
This note was copied from a baby's chart. Lactation Consultation Note  Patient Name: Tanya Moore Today's Date: 11/29/2019 Reason for consult: Follow-up assessment;Early term 37-38.6wks  LC in to visit with P2 Mom of ET infant at 15 hrs old.  Baby is at 7.3% weight loss, with 4 stools and numerous voids that haven't been charted in last 25 hrs.  Baby being held by FOB, swaddled and sleeping.    Encouraged keeping baby STS as much as possible unless Mom is sleeping.  Mom to offer breast with cues, goal of >8 feedings per 24 hrs. Mom breastfed 9 times in last 24 hrs.  Mom denies any difficulty latching baby and denies any nipple pain.  Latch score given by Lavell Luster RN of 10.  Engorgement prevention and treatment reviewed.  Mom aware of OP lactation support available to her, and encouraged to call prn. .   Interventions Interventions: Skin to skin;Breast massage;Hand express;Breast feeding basics reviewed   Consult Status Consult Status: Complete Date: 11/29/19 Follow-up type: Call as needed    [name] 11/29/2019, 10:28 AM

## 2019-11-29 NOTE — Lactation Note (Signed)
This note was copied from a baby's chart. Lactation Consultation Note  Patient Name: Girl Shaketha Jeon Today's Date: 11/29/2019  P2, 41 ETI female infant-7%. Infant had weight loss -1% in past 24 hours infant weight is starting stabilize. Per mom, infant had 4 stools. Per mom, infant has been breastfeeding 10-20 minutes most feeding but her  last  feeding was only 5 minutes. Per mom, infant had breastfeed less than 2 hours prior to [facility] entering the room, she has been breastfeeding all night due to cluster feeding.  Mom has been doing breast stimulation to keep infant awake for breastfeeding. LC reviewed hand expression and infant took 8 mls by spoon. Mom will continue to breastfed infant on demand by cues and not exceed 3 hours without feeding infant.  Mom know she can hand express after latching infant at breast and give back extra volume if she chooses. Mom knows to call RN or LC if she has any questions, concerns or needs assistance with latching infant at breast.   Maternal Data    Feeding    LATCH Score                   Interventions    Lactation Tools Discussed/Used     Consult Status      Danelle Earthly 11/29/2019, 4:55 AM

## 2019-12-01 NOTE — Progress Notes (Signed)
Patient seen and assessed by nursing staff during this encounter. I have reviewed the chart and agree with the documentation and plan. I have also made any necessary editorial changes.  Tangelia Sanson, MD 12/01/2019 7:37 AM   

## 2019-12-05 ENCOUNTER — Ambulatory Visit (HOSPITAL_COMMUNITY): Payer: Medicaid Other

## 2019-12-05 ENCOUNTER — Ambulatory Visit: Payer: Medicaid Other | Attending: Family

## 2019-12-05 DIAGNOSIS — Z23 Encounter for immunization: Secondary | ICD-10-CM

## 2019-12-05 NOTE — Progress Notes (Signed)
   Covid-19 Vaccination Clinic  Name:  Tanya Moore    MRN: [id number] DOB: Dec 07, 1980  12/05/2019  [name] was observed post Covid-19 immunization for 15 minutes without incident. She was provided with Vaccine Information Sheet and instruction to access the V-Safe system.   [name] was instructed to call 911 with any severe reactions post vaccine: Marland Kitchen Difficulty breathing  . Swelling of face and throat  . A fast heartbeat  . A bad rash all over body  . Dizziness and weakness   Immunizations Administered    Name Date Dose VIS Date Route   Moderna COVID-19 Vaccine 12/05/2019 10:09 AM 0.5 mL 07/2019 Intramuscular   Manufacturer: Moderna   Lot: [id number]   NDC: 90383-338-32

## 2019-12-06 ENCOUNTER — Encounter: Payer: Medicaid Other | Admitting: Obstetrics and Gynecology

## 2019-12-06 ENCOUNTER — Other Ambulatory Visit: Payer: Medicaid Other

## 2019-12-06 ENCOUNTER — Ambulatory Visit (INDEPENDENT_AMBULATORY_CARE_PROVIDER_SITE_OTHER): Payer: Medicaid Other

## 2019-12-06 ENCOUNTER — Other Ambulatory Visit: Payer: Self-pay

## 2019-12-06 VITALS — BP 132/82 | Wt 191.5 lb

## 2019-12-06 DIAGNOSIS — Z0130 Encounter for examination of blood pressure without abnormal findings: Secondary | ICD-10-CM

## 2019-12-06 DIAGNOSIS — Z5189 Encounter for other specified aftercare: Secondary | ICD-10-CM

## 2019-12-06 NOTE — Progress Notes (Signed)
Pt here today for BP check and incision check.  Pt reports taking Procardia 30 mg tablet at 0800.  Pt's BP 132/82 LA.  Pt reports having headaches however she has a hx of headaches.  Pt denies any other sx's of HTN.  Pt also here for incision check s/p c-section on 11/27/19.  Honeycomb dressing removed.  Incision well approximated- no odor, no drainage, mild edema, and no erythema.  Pt advised to continue to taking the Procardia 30 mg tablet as prescribed, monitor of sx's HTN, continue monitoring for infection, and that we will f/u with her at her pp appt scheduled on 01/01/20 @ 1515.  Pt verbalized understanding.    Addison Naegeli, RN

## 2019-12-06 NOTE — Progress Notes (Signed)
Patient seen and assessed by nursing staff.  Agree with documentation and plan.  

## 2019-12-09 ENCOUNTER — Encounter (HOSPITAL_COMMUNITY): Payer: Self-pay | Admitting: Emergency Medicine

## 2019-12-09 ENCOUNTER — Emergency Department (HOSPITAL_COMMUNITY)
Admission: EM | Admit: 2019-12-09 | Discharge: 2019-12-10 | Disposition: A | Discharge status: Home / Self Care | Payer: Medicaid Other | Attending: Emergency Medicine | Admitting: Emergency Medicine

## 2019-12-09 ENCOUNTER — Other Ambulatory Visit: Payer: Self-pay

## 2019-12-09 DIAGNOSIS — Z87891 Personal history of nicotine dependence: Secondary | ICD-10-CM | POA: Diagnosis not present

## 2019-12-09 DIAGNOSIS — R109 Unspecified abdominal pain: Secondary | ICD-10-CM | POA: Diagnosis not present

## 2019-12-09 DIAGNOSIS — Z79899 Other long term (current) drug therapy: Secondary | ICD-10-CM | POA: Diagnosis not present

## 2019-12-09 DIAGNOSIS — T8332XA Displacement of intrauterine contraceptive device, initial encounter: Secondary | ICD-10-CM | POA: Insufficient documentation

## 2019-12-09 NOTE — ED Triage Notes (Signed)
Pt states she had an IUD placed 419 after her c-section and she started feeling today that is getting out and pain.

## 2019-12-09 NOTE — ED Notes (Signed)
Patient stepping out to car to nurse her newborn

## 2019-12-10 NOTE — ED Notes (Signed)
Patient verbalizes understanding of discharge instructions. Opportunity for questioning and answers were provided. Armband removed by staff, pt discharged from ED ambulatory.   

## 2019-12-10 NOTE — ED Provider Notes (Signed)
[facility] EMERGENCY DEPARTMENT Provider Note   CSN: [id number] Arrival date & time: 12/09/19  2225     History Chief Complaint  Patient presents with  . IUD problems    Tanya Moore is a 39 y.o. female.  The history is provided by the patient and medical records.    39 year old female presenting to the ED with pain secondary to her IUD.  She had this placed immediately after her C-section on 11/27/2019.  States over the past 24 hours she feels like her IUD is now dislodged into her vaginal canal.  States she has a lot of pain when sitting upright, using the bathroom, etc.  She called her GYN office and was told this was certainly possible, however they could not see her until Monday.  She still having some bleeding from pregnancy, cannot tell if this is any worse currently.  She denies any discharge.  She has not been sexually active since her C-section.  Past Medical History:  Diagnosis Date  . Allergy   . Anxiety   . Bipolar 1 disorder ([place])   . Depression   . Ectopic pregnancy with intrauterine pregnancy/Heterotopic Pregnancy 05/03/2019   S/p laparoscopic right salpingectomy on 05/03/19 05/03/19 SURGICAL PATHOLOGY FALLOPIAN TUBE WITH ECTOPIC PREGNANCY, RIGHT, SALPINGECTOMY:  - Benign fallopian tube with chorionic villi consistent with ectopic pregnancy.   . Headache   . Hemoperitoneum due to rupture of right tubal ectopic pregnancy 05/03/2019  . History of ectopic pregnancy in 11/2018 11/20/2018   Mtx#1 given 4/3: Hcg day 0 (147), day 4 (180), day 7 (193) Mtx # 2 given 4/9 (193)->4/12 (153)->4/15 (79)  . Hyperlipidemia   . Infection    UTI  . Pregnancy induced hypertension   . Psoriasis 2020    Patient Active Problem List   Diagnosis Date Noted  . Cesarean delivery delivered 11/27/2019  . IUD (intrauterine device) in place 11/27/2019  . Gestational hypertension 11/09/2019  . Supervision of high risk pregnancy, antepartum 05/26/2019  . History of low  transverse cesarean section 05/26/2019  . Hypercholesteremia 02/19/2019  . Skin lesion of left arm 02/19/2019    Past Surgical History:  Procedure Laterality Date  . APPENDECTOMY    . BUNIONECTOMY    . CESAREAN SECTION  2017  . CESAREAN SECTION N/A 11/27/2019   Procedure: CESAREAN SECTION AND IUD INSERTION;  Surgeon: Chancy Milroy, MD;  Location: [facility];  Service: Obstetrics;  Laterality: N/A;  . DIAGNOSTIC LAPAROSCOPY WITH REMOVAL OF ECTOPIC PREGNANCY N/A 05/03/2019   Procedure: RIGHT SALPINGECTOMY AND REMOVAL OF ECTOPIC PREGNANCY;  Surgeon: Osborne Oman, MD;  Location: [place];  Service: Gynecology;  Laterality: N/A;  . TONSILLECTOMY       OB History    Gravida  4   Para  2   Term  2   Preterm      AB  2   Living  2     SAB      TAB  1   Ectopic  1   Multiple  0   Live Births  2           Family History  Problem Relation Age of Onset  . Hypertension Mother   . Hypertension Father   . Cancer Father        prostate  . Depression Sister     Social History   Tobacco Use  . Smoking status: Former Smoker    Quit date: 2017  Years since quitting: 4.3  . Smokeless tobacco: Never Used  Substance Use Topics  . Alcohol use: No  . Drug use: No    Home Medications Prior to Admission medications   Medication Sig Start Date End Date Taking? Authorizing Provider  acetaminophen (TYLENOL) 325 MG tablet Take 2 tablets (650 mg total) by mouth every 4 (four) hours as needed (for pain scale < 4  OR  temperature  >/=  100.5 F). 11/29/19   Clarnce Flock, MD  Blood Pressure Monitoring (BLOOD PRESSURE KIT) DEVI 1 Device by Does not apply route as needed. 06/21/19   Anyanwu, Sallyanne Havers, MD  Calcium Carb-Cholecalciferol (CALCIUM + D3) 600-200 MG-UNIT TABS Take 1 tablet by mouth daily.     [provider]  ferrous sulfate 325 (65 FE) MG tablet Take 1 tablet (325 mg total) by mouth every other day. 11/30/19   Clarnce Flock, MD  ibuprofen (ADVIL)  800 MG tablet Take 1 tablet (800 mg total) by mouth every 8 (eight) hours. 11/29/19   Clarnce Flock, MD  NIFEdipine (ADALAT CC) 30 MG 24 hr tablet Take 1 tablet (30 mg total) by mouth daily. 11/29/19   Clarnce Flock, MD  Prenatal Vit-Fe Fumarate-FA (PRENATAL MULTIVITAMIN) TABS tablet Take 1 tablet by mouth daily at 12 noon.    [provider]    Allergies    Lamictal [lamotrigine]  Review of Systems   Review of Systems  Genitourinary: Positive for vaginal pain.  All other systems reviewed and are negative.   Physical Exam Updated Vital Signs BP (!) 124/98   Pulse 75   Temp 98.3 F (36.8 C) (Oral)   Resp 14   Ht 5' 4"  (1.626 m)   Wt 84.4 kg   LMP 03/13/2019   SpO2 97%   BMI 31.94 kg/m   Physical Exam Vitals and nursing note reviewed.  Constitutional:      Appearance: She is well-developed.  HENT:     Head: Normocephalic and atraumatic.  Eyes:     Conjunctiva/sclera: Conjunctivae normal.     Pupils: Pupils are equal, round, and reactive to light.  Cardiovascular:     Rate and Rhythm: Normal rate and regular rhythm.     Heart sounds: Normal heart sounds.  Pulmonary:     Effort: Pulmonary effort is normal. No respiratory distress.     Breath sounds: Normal breath sounds. No rhonchi.  Abdominal:     General: Bowel sounds are normal.     Palpations: Abdomen is soft.  Genitourinary:    Comments: Normal female external genitialia, IUD string is visible hanging out of vaginal canal; IUD is malpositioned external to the cervical os; some brown, mucousy discharge noted Musculoskeletal:        General: Normal range of motion.     Cervical back: Normal range of motion.  Skin:    General: Skin is warm and dry.  Neurological:     Mental Status: She is alert and oriented to person, place, and time.     ED Results / Procedures / Treatments   Labs (all labs ordered are listed, but only abnormal results are displayed) Labs Reviewed - No data to  display  EKG None  Radiology No results found.  Procedures .Foreign Body Removal  Date/Time: 12/10/2019 4:32 AM Performed by: Larene Pickett, PA-C Authorized by: Larene Pickett, PA-C  Consent: Verbal consent obtained. Risks and benefits: risks, benefits and alternatives were discussed Consent given by: patient Patient understanding: patient  states understanding of the procedure being performed Patient consent: the patient's understanding of the procedure matches consent given Required items: required blood products, implants, devices, and special equipment available Patient identity confirmed: verbally with patient Time out: Immediately prior to procedure a "time out" was called to verify the correct patient, procedure, equipment, support staff and site/side marked as required. Body area: vagina Patient restrained: no Localization method: speculum Removal mechanism: digital extraction Complexity: simple 1 objects recovered. Objects recovered: IUD Post-procedure assessment: foreign body removed Patient tolerance: patient tolerated the procedure well with no immediate complications   (including critical care time)  Medications Ordered in ED Medications - No data to display  ED Course  I have reviewed the triage vital signs and the nursing notes.  Pertinent labs & imaging results that were available during my care of the patient were reviewed by me and considered in my medical decision making (see chart for details).    MDM Rules/Calculators/A&P  39 year old female presenting to the ED with complaint of pain from her IUD.  This was placed immediately after her C-section on 11/27/2019 and now feels like it is malpositioned.  She feels like it has fallen out of her cervix.  On exam, her IUD string is visibly hanging out of the vaginal canal.  IUD itself is malpositioned and essentially external to the cervical os.  This was removed without difficulty.  Patient reports immediate  relief of her symptoms.  I discussed with her that she will need to use backup form of birth control and will need to schedule some follow-up with her OB/GYN for repeat placement of IUD or other birth control management.  She will return here for any new or acute changes.  Final Clinical Impression(s) / ED Diagnoses Final diagnoses:  Malpositioned intrauterine device (IUD), initial encounter    Rx / DC Orders ED Discharge Orders    None       Larene Pickett, PA-C 12/10/19 0527    Merrily Pew, MD 12/10/19 [phone]

## 2019-12-10 NOTE — Discharge Instructions (Signed)
You will need to use back up birth control now that IUD has been removed. Make sure to schedule some follow-up with your OB-GYN when ready to have IUD or alternative birth control started again. Return here for any new/acute changes.

## 2019-12-13 ENCOUNTER — Other Ambulatory Visit: Payer: Medicaid Other

## 2019-12-13 ENCOUNTER — Encounter: Payer: Medicaid Other | Admitting: Obstetrics and Gynecology

## 2019-12-13 ENCOUNTER — Telehealth: Payer: Self-pay | Admitting: *Deleted

## 2019-12-13 NOTE — Telephone Encounter (Signed)
Pt left VM message stating that she had C/S on 4/19 with IUD placed. She went to the ED a few days ago because the IUD was half way hanging out and it was removed. She wants to know what will be done @ her PP visit on 5/24. Please call to discuss.

## 2019-12-14 NOTE — Telephone Encounter (Signed)
Called patient in regards to her phone call. Left message that device can be replaced at her PP visit. Asked patient to call the office with any questions or concerns as needed.

## 2019-12-18 ENCOUNTER — Inpatient Hospital Stay (HOSPITAL_COMMUNITY): Admit: 2019-12-18 | Payer: Self-pay

## 2020-01-01 ENCOUNTER — Other Ambulatory Visit: Payer: Self-pay

## 2020-01-01 ENCOUNTER — Ambulatory Visit (INDEPENDENT_AMBULATORY_CARE_PROVIDER_SITE_OTHER): Payer: Medicaid Other | Admitting: Advanced Practice Midwife

## 2020-01-01 ENCOUNTER — Encounter: Payer: Self-pay | Admitting: Advanced Practice Midwife

## 2020-01-01 DIAGNOSIS — O133 Gestational [pregnancy-induced] hypertension without significant proteinuria, third trimester: Secondary | ICD-10-CM

## 2020-01-01 DIAGNOSIS — T148XXA Other injury of unspecified body region, initial encounter: Secondary | ICD-10-CM

## 2020-01-01 DIAGNOSIS — O8600 Infection of obstetric surgical wound, unspecified: Secondary | ICD-10-CM

## 2020-01-01 DIAGNOSIS — L089 Local infection of the skin and subcutaneous tissue, unspecified: Secondary | ICD-10-CM

## 2020-01-01 DIAGNOSIS — O135 Gestational [pregnancy-induced] hypertension without significant proteinuria, complicating the puerperium: Secondary | ICD-10-CM

## 2020-01-01 DIAGNOSIS — Z1389 Encounter for screening for other disorder: Secondary | ICD-10-CM

## 2020-01-01 MED ORDER — TERCONAZOLE 0.4 % VA CREA
1.0000 | TOPICAL_CREAM | Freq: Every day | VAGINAL | 0 refills | Status: DC
Start: 2020-01-01 — End: 2020-12-05

## 2020-01-01 MED ORDER — CEFADROXIL 500 MG PO CAPS
500.0000 mg | ORAL_CAPSULE | Freq: Two times a day (BID) | ORAL | 0 refills | Status: DC
Start: 2020-01-01 — End: 2021-02-04

## 2020-01-01 NOTE — Patient Instructions (Signed)

## 2020-01-01 NOTE — Progress Notes (Addendum)
    Post Partum Visit Note  Tanya Moore is a 39 y.o. [phone] female who presents for a postpartum visit. She is 5 weeks postpartum following a primary cesarean section.  I have fully reviewed the prenatal and intrapartum course. The delivery was at 37 gestational weeks.  Anesthesia: spinal. Postpartum course has been uncomplicated. Baby is doing well. Baby is exclusively feeding by breast. Bleeding no bleeding. Bowel function is normal. Bladder function is normal. Patient is not sexually active. Contraception method is none. Postpartum depression screening: negative.  The following portions of the patient's history were reviewed and updated as appropriate: allergies, current medications, past family history, past medical history, past social history, past surgical history and problem list.  Review of Systems A comprehensive review of systems was negative.    Objective:  Last menstrual period 03/13/2019, unknown if currently breastfeeding.  General:  alert, cooperative, appears stated age and no distress   Breasts:  negative  Lungs: clear to auscultation bilaterally  Heart:  regular rate and rhythm, S1, S2 normal, no murmur, click, rub or gallop  Abdomen: soft, non-tender; bowel sounds normal; no masses,  no organomegaly Presence of surgical incision   Pelvic:  Not evaluated         Assessment:   1. Overall normal postpartum exam.  2. Concern for impaired healing of cesarean incision. Dr. Adrian Blackwater at bedside to assess.ABX ordered s/p consult 3. S/p IUD spontaneous expulsion. Patient declines reinsertion for now. Declines contraception due to concern for impact on milk supply 4. Hx GHTN, stopped taking Procardia after hospital discharge. Endorses BPs of 100s/60s on Procardia, worried about syncopal events due to hypotension. Continue to monitor   Plan:   Essential components of care per ACOG recommendations:  1.  Mood and well being: Patient with negative depression screening  today. Reviewed local resources for support.  - Patient does not use tobacco. - hx of drug use? No    2. Infant care and feeding:  -Patient currently breastmilk feeding? No   3. Sexuality, contraception and birth spacing - Patient does not want a pregnancy in the next year.    4. Sleep and fatigue -Encouraged family/partner/community support of 4 hrs of uninterrupted sleep to help with mood and fatigue  5. Physical Recovery  - Discussed patients delivery and complications - Patient has urinary incontinence? No - Patient is safe to resume physical and sexual activity  RTC in about one week for incision assessment  Clayton Bibles, MSN, CNM Certified Nurse Midwife, [facility] for [employer], [facility] 01/01/20 8:50 PM

## 2020-01-16 ENCOUNTER — Ambulatory Visit: Payer: Medicaid Other | Admitting: Student

## 2020-04-08 DIAGNOSIS — F99 Mental disorder, not otherwise specified: Secondary | ICD-10-CM | POA: Diagnosis not present

## 2020-04-19 ENCOUNTER — Other Ambulatory Visit: Payer: Self-pay

## 2020-04-19 ENCOUNTER — Other Ambulatory Visit: Payer: Medicaid Other

## 2020-04-19 DIAGNOSIS — Z20822 Contact with and (suspected) exposure to covid-19: Secondary | ICD-10-CM

## 2020-04-22 LAB — NOVEL CORONAVIRUS, NAA: SARS-CoV-2, NAA: NOT DETECTED

## 2020-04-30 ENCOUNTER — Ambulatory Visit: Payer: Medicaid Other | Admitting: Dermatology

## 2020-05-09 DIAGNOSIS — Z20822 Contact with and (suspected) exposure to covid-19: Secondary | ICD-10-CM | POA: Diagnosis not present

## 2020-06-05 ENCOUNTER — Other Ambulatory Visit: Payer: Self-pay

## 2020-06-05 ENCOUNTER — Ambulatory Visit (INDEPENDENT_AMBULATORY_CARE_PROVIDER_SITE_OTHER): Payer: Medicaid Other | Admitting: Family Medicine

## 2020-06-05 ENCOUNTER — Encounter: Payer: Self-pay | Admitting: Family Medicine

## 2020-06-05 VITALS — BP 118/82 | HR 70 | Ht 64.0 in | Wt 180.0 lb

## 2020-06-05 DIAGNOSIS — F319 Bipolar disorder, unspecified: Secondary | ICD-10-CM | POA: Diagnosis not present

## 2020-06-05 DIAGNOSIS — E7800 Pure hypercholesterolemia, unspecified: Secondary | ICD-10-CM | POA: Diagnosis not present

## 2020-06-05 DIAGNOSIS — Z23 Encounter for immunization: Secondary | ICD-10-CM | POA: Diagnosis not present

## 2020-06-05 DIAGNOSIS — Z1231 Encounter for screening mammogram for malignant neoplasm of breast: Secondary | ICD-10-CM | POA: Diagnosis not present

## 2020-06-05 DIAGNOSIS — Z1159 Encounter for screening for other viral diseases: Secondary | ICD-10-CM

## 2020-06-05 DIAGNOSIS — Z0000 Encounter for general adult medical examination without abnormal findings: Secondary | ICD-10-CM | POA: Diagnosis not present

## 2020-06-05 NOTE — Progress Notes (Signed)
Subjective:  CC -- Annual Physical; With complaints of worsening bipolar disorder.  Patient has been off of medication since before her most recent pregnancy, her daughter is now 8 months old.  She reports that she has been on numerous medications and follow-up with a psychiatrist in [place], but has been unable to find someone that she likes here.  She reports that she can feel that she is becoming to be more irritable, denies SI.  States that she does not feel this is related to postpartum depression, just feels that she is unable to cope with things.  She has not been able to find a therapist recently that she likes.   Cardiovascular: - Risk as of 06/05/20: Given age, ASCVD cannot be calculated - Dx Hypertension: Gestational HTN in most recent pregnancy - Dx Hyperlipidemia: yes, but no indication for statin, LDL 163 in 02/2019, diagnosed at age 25, tries to eat well and exercise - Dx Obesity: no - Physical Activity: yes  - Diabetes: no   Cancer: Colorectal >> Colonoscopy: no family history Lung >> Tobacco Use: quit 5 years ago, smoked for 20 years, 1 ppd Breast >> Mammogram: no, unsure of family history  Cervical/Endometrial >>  - Postmenopausal: no  - Vaginal Bleeding: yes, Patient's last menstrual period was 05/22/2020. - Pap Smear: yes, NILM with negative HPV 05/2019, good for 5 years  - Previous Abnormal Pap: yes, many years ago  Skin >> Suspicious lesions: no   Social: Alcohol Use: no  Tobacco Use: no  Other Drugs: no  Risky Sexual Behavior: no  Depression: yes, bipolar disorder, has previously been on medications, but none since she was pregnant with her daughter who was now 52 months old  - PHQ9 score:    Routine Prenatal from 11/14/2019 in [place] for [employer]  PHQ-9 Total Score 7     Support and Life at Home: yes   Other: Will receive flu shot today.   ROS- negative aside from mood disturbance  Past Medical History Patient Active Problem  List   Diagnosis Date Noted  . Encounter for health maintenance examination 06/05/2020  . Bipolar disorder ([place]) 06/05/2020  . Cesarean delivery delivered 11/27/2019  . IUD (intrauterine device) in place 11/27/2019  . Gestational hypertension 11/09/2019  . Supervision of high risk pregnancy, antepartum 05/26/2019  . History of low transverse cesarean section 05/26/2019  . Hypercholesteremia 02/19/2019  . Skin lesion of left arm 02/19/2019    Medications- reviewed and updated Current Outpatient Medications  Medication Sig Dispense Refill  . acetaminophen (TYLENOL) 325 MG tablet Take 2 tablets (650 mg total) by mouth every 4 (four) hours as needed (for pain scale < 4  OR  temperature  >/=  100.5 F). 30 tablet 0  . Blood Pressure Monitoring (BLOOD PRESSURE KIT) DEVI 1 Device by Does not apply route as needed. 1 Device 0  . Calcium Carb-Cholecalciferol (CALCIUM + D3) 600-200 MG-UNIT TABS Take 1 tablet by mouth daily.     . cefadroxil (DURICEF) 500 MG capsule Take 1 capsule (500 mg total) by mouth 2 (two) times daily. 14 capsule 0  . ferrous sulfate 325 (65 FE) MG tablet Take 1 tablet (325 mg total) by mouth every other day. 30 tablet 2  . ibuprofen (ADVIL) 800 MG tablet Take 1 tablet (800 mg total) by mouth every 8 (eight) hours. 30 tablet 0  . Prenatal Vit-Fe Fumarate-FA (PRENATAL MULTIVITAMIN) TABS tablet Take 1 tablet by mouth daily at 12 noon.    Marland Kitchen  terconazole (TERAZOL 7) 0.4 % vaginal cream Place 1 applicator vaginally at bedtime. Use for seven days 45 g 0   No current facility-administered medications for this visit.    Objective: BP 118/82   Pulse 70   Ht 5' 4"  (1.626 m)   Wt 180 lb (81.6 kg)   LMP 05/22/2020   Breastfeeding Yes   BMI 30.90 kg/m  Gen: NAD, alert, cooperative with exam HEENT: NCAT, EOMI, PERRL Neck: Supple, no thyromegaly or cervical LAD CV: RRR, good S1/S2, no murmur Resp: CTABL, no wheezes, non-labored Ext: No edema, warm Neuro: Alert and oriented, No  gross deficits Psych: Mood and affect appropriate for circumstance, appropriate dress, thought process linear and logical   Assessment/Plan:  Encounter for health maintenance examination Overall, patient is up-to-date on health maintenance aside from hepatitis C screening which was performed today.  She received a flu shot today.  We will start mammograms at age 53, she turns 30 on 11/9, therefore will get this scheduled at the breast center after her birthday.  Will obtain a BMP today.  Bipolar disorder ([place]) Patient denies SI.  She has a rather complicated history with medications in regards to her bipolar disorder, therefore would most suitably work with a psychiatrist.  Advised patient to contact [facility] behavioral health for psychiatry and therapy.  We will have her follow-up in 2 months to ensure that this is being taken care of and she is doing well from this perspective.  Hypercholesteremia Reports a history of hyperlipidemia since she was 39 years old.  Last lipid panel was not clear to show benefit of statin.  Given that she is almost 78, will obtain lipid panel today and assess ASCVD risk.   Orders Placed This Encounter  Procedures  . MM Digital Screening    Standing Status:   Future    Standing Expiration Date:   06/05/2021    Order Specific Question:   Reason for Exam (SYMPTOM  OR DIAGNOSIS REQUIRED)    Answer:   screening    Order Specific Question:   Is the patient pregnant?    Answer:   No    Order Specific Question:   Preferred imaging location?    Answer:   [facility]  . Flu Vaccine QUAD 36+ mos IM  . Lipid Panel  . Basic Metabolic Panel  . Hepatitis C antibody    No orders of the defined types were placed in this encounter.    [place] Constable, DO, PGY-3 06/05/2020 10:58 AM

## 2020-06-05 NOTE — Assessment & Plan Note (Signed)
Patient denies SI.  She has a rather complicated history with medications in regards to her bipolar disorder, therefore would most suitably work with a psychiatrist.  Advised patient to contact [facility] behavioral health for psychiatry and therapy.  We will have her follow-up in 2 months to ensure that this is being taken care of and she is doing well from this perspective.

## 2020-06-05 NOTE — Patient Instructions (Signed)
Thank you for coming to see me today. It was a pleasure. Today we talked about:   We will get some labs today.  If they are abnormal or we need to do something about them, I will call you.  If they are normal, I will send you a message on MyChart (if it is active) or a letter in the mail.  If you don't hear from [place] in 2 weeks, please call the office at the number below.  Please follow-up with me in 2 months.  If you have any questions or concerns, please do not hesitate to call the office at [phone].  Best,   Luis Abed, DO   Therapy and Counseling Resources Most providers on this list will take Medicaid. Patients with commercial insurance or Medicare should contact their insurance company to get a list of in network providers.  BestDay:Psychiatry and Counseling [address] [place], [place] [address] [phone]  [facility]  [address] Suite [place], [place] [address]      [phone]  [facility] [address]  [place], [place] [address] [phone]  [facility] [address]  [place], [place] [address]       [phone]      Jovita Kussmaul [facility] [address], [place], [place] [phone]  [facility]:  [address] [place] [phone]  [facility]:  [address] [place] [phone]  [facility] (under & uninsured) [address]   [place] [phone]    [email or web address]    [facility] [address] . [place]    [phone]  [facility] -[address]     Phone:  [phone]     [facility]-  [address]  [phone]   [facility] [address]      [place], [place] [phone]  [facility]: [address] [place], [place], [place]  [phone]    [facility] (Spanish therapist) [email or web address]  [address] [place]  [address]   [place] [address]    [phone]    [facility]   [address],  [place], [place]  [phone]   [facility]  [address] [place]  [phone]  [facility]  [address] [place], [place]        [phone]  Open Access/Walk In Clinic under & uninsured  [facility]  [address] [place], [place] [phone] Crisis [phone]  [facility],  (Spanish)   [address], [place]: [phone]) 8:30 - 12; 1 - 2:30  [facility] [employer],  [address] [place], [place]    [phone]):8:30 - 12; 2 - 3PM  [facility] [employer],  [address]  [place]; [phone]):   Mon - Fri 8 AM - 5 PM  [facility] [address] [place]  MWF 12:30 to 3:00 or call to schedule an appointment  [phone]  Specific Provider options Psychology Today  [email or web address].psychologytoday.com/us 1. click on find a therapist  2. enter your zip code 3. left side and select or tailor a therapist for your specific need.   [facility] Provider Directory [email or web address]  (Medicaid)   Follow  all drop down to find a provider  Social Support program [facility] [place] [phone] or Photo[employer].pl [address], [place], [place] Recovery support and educational   24- Hour Availability:  .  Marland Kitchen [facility]  . [address] [place], [place] [employer] [phone] Crisis [phone]  . [facility] [phone]  [facility] Crisis Service  [phone]   . [facility] [employer]  [phone] (after hours)  . Therapeutic Alternative/Mobile Crisis   [phone]  . [place] National Suicide Hotline  [phone]  (TALK)  . Call 911 or go to emergency room  . [employer]  [phone]);  [place] and [employer]   . [facility]  [phone]); [facility], [facility], [place], [place], [place], [place], [place]

## 2020-06-05 NOTE — Assessment & Plan Note (Signed)
Reports a history of hyperlipidemia since she was 39 years old.  Last lipid panel was not clear to show benefit of statin.  Given that she is almost 39, will obtain lipid panel today and assess ASCVD risk.

## 2020-06-05 NOTE — Assessment & Plan Note (Signed)
Overall, patient is up-to-date on health maintenance aside from hepatitis C screening which was performed today.  She received a flu shot today.  We will start mammograms at age 39, she turns 67 on 11/9, therefore will get this scheduled at the breast center after her birthday.  Will obtain a BMP today.

## 2020-06-06 LAB — HEPATITIS C ANTIBODY: Hep C Virus Ab: <0.1 s/co ratio (ref 0.0–0.9)

## 2020-06-06 LAB — BASIC METABOLIC PANEL
BUN/Creatinine Ratio: 17 (ref 9–23)
BUN: 12 mg/dL (ref 6–20)
CO2: 20 mmol/L (ref 20–29)
Calcium: 9.7 mg/dL (ref 8.7–10.2)
Chloride: 102 mmol/L (ref 96–106)
Creatinine, Ser: 0.69 mg/dL (ref 0.57–1.00)
GFR calc Af Amer: 127 mL/min/{1.73_m2} (ref >59)
GFR calc non Af Amer: 110 mL/min/{1.73_m2} (ref >59)
Glucose: 82 mg/dL (ref 65–99)
Potassium: 4.4 mmol/L (ref 3.5–5.2)
Sodium: 140 mmol/L (ref 134–144)

## 2020-06-06 LAB — LIPID PANEL
Chol/HDL Ratio: 5 ratio — ABNORMAL HIGH (ref 0.0–4.4)
Cholesterol, Total: 305 mg/dL — ABNORMAL HIGH (ref 100–199)
HDL: 61 mg/dL (ref >39)
LDL Chol Calc (NIH): 228 mg/dL — ABNORMAL HIGH (ref 0–99)
Triglycerides: 94 mg/dL (ref 0–149)
VLDL Cholesterol Cal: 16 mg/dL (ref 5–40)

## 2020-06-19 ENCOUNTER — Encounter: Payer: Self-pay | Admitting: Family Medicine

## 2020-06-20 ENCOUNTER — Other Ambulatory Visit: Payer: Self-pay | Admitting: Family Medicine

## 2020-06-20 DIAGNOSIS — F319 Bipolar disorder, unspecified: Secondary | ICD-10-CM

## 2020-06-20 MED ORDER — LATUDA 20 MG PO TABS: 20.0000 mg | ORAL_TABLET | Freq: Every day | ORAL | 1 refills | Status: DC

## 2020-06-20 NOTE — Progress Notes (Signed)
Per previous discussion with patient, she has scheduled appt with psych for bipolar disorder, but was unable to get appt until Jan and would like to start medication treatment prior to the holidays.  She was previously on latuda and tolerated this well.  Also took while breastfeeding.  Will restart at lowest dose and titrate as needed.

## 2020-07-22 ENCOUNTER — Ambulatory Visit: Payer: Medicaid Other | Admitting: Family Medicine

## 2020-07-23 ENCOUNTER — Other Ambulatory Visit: Payer: Self-pay

## 2020-07-23 ENCOUNTER — Ambulatory Visit (INDEPENDENT_AMBULATORY_CARE_PROVIDER_SITE_OTHER): Payer: Medicaid Other

## 2020-07-23 DIAGNOSIS — Z23 Encounter for immunization: Secondary | ICD-10-CM | POA: Diagnosis not present

## 2020-07-23 NOTE — Progress Notes (Signed)
   Covid-19 Vaccination Clinic  Name:  NGA RABON    MRN: [id number] DOB: 12/02/1980  07/23/2020  [name] was observed post Covid-19 immunization for 15 minutes without incident. She was provided with Vaccine Information Sheet and instruction to access the V-Safe system.   Ms. Pletz was instructed to call 911 with any severe reactions post vaccine: Marland Kitchen Difficulty breathing  . Swelling of face and throat  . A fast heartbeat  . A bad rash all over body  . Dizziness and weakness   Covid Booster administered LD without complication.

## 2020-08-07 ENCOUNTER — Telehealth: Payer: Medicaid Other | Admitting: Family Medicine

## 2020-08-12 ENCOUNTER — Ambulatory Visit (HOSPITAL_COMMUNITY): Payer: Medicaid Other | Admitting: Clinical

## 2020-08-12 ENCOUNTER — Encounter: Payer: Self-pay | Admitting: Family Medicine

## 2020-09-05 ENCOUNTER — Ambulatory Visit (INDEPENDENT_AMBULATORY_CARE_PROVIDER_SITE_OTHER): Payer: Medicaid Other | Admitting: Professional

## 2020-09-05 ENCOUNTER — Other Ambulatory Visit: Payer: Self-pay

## 2020-09-05 DIAGNOSIS — F3181 Bipolar II disorder: Secondary | ICD-10-CM | POA: Diagnosis not present
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
## 2020-09-19 ENCOUNTER — Other Ambulatory Visit: Payer: Self-pay

## 2020-09-19 ENCOUNTER — Ambulatory Visit (INDEPENDENT_AMBULATORY_CARE_PROVIDER_SITE_OTHER): Payer: Medicaid Other | Admitting: Professional

## 2020-09-19 DIAGNOSIS — F3181 Bipolar II disorder: Secondary | ICD-10-CM

## 2020-09-20 NOTE — Progress Notes (Signed)
Virtual Visit via Video Note  I connected with Tanya Moore on 09/19/20 at 11:00 AM EST by a video enabled telemedicine application and verified that I am speaking with the correct person using two identifiers.  Location: Patient: Home Provider: Clinical Home Office   I discussed the limitations of evaluation and management by telemedicine and the availability of in person appointments. The patient expressed understanding and agreed to proceed.  Follow Up Instructions:    I discussed the assessment and treatment plan with the patient. The patient was provided an opportunity to ask questions and all were answered. The patient agreed with the plan and demonstrated an understanding of the instructions.   The patient was advised to call back or seek an in-person evaluation if the symptoms worsen or if the condition fails to improve as anticipated.  I provided 43 minutes of non-face-to-face time during this encounter.   [name], [facility]    THERAPIST PROGRESS NOTE  Session Time: 11a  Participation Level: Active  Behavioral Response: CasualAlertAnxious and Depressed  Type of Therapy: Individual Therapy  Treatment Goals addressed: Depression  Interventions: CBT, DBT, Solution Focused, Strength-based, Supportive and Reframing  Summary: Tanya Moore is a 40 y.o. female who presents with depression symptoms.  Pt reports things have been "OK" since last meeting with this cln. Pt reports she is proud of self for not "blowing up" at dentist office after going up there to pick up x-rays as told over the phone to do. Pt reports she is trying to meditate over the kids' nap time and gotten back into skin care routine for self-care. Pt has been taking an hour bath 1x a week for self-care and that is helping. Pt is working on getting her child into a different school for next year. Pt is frustrated since she is having to handle it by herself since partner is busy with work. Partner is  aware of the issues at school and is open to discussion but is not the one to handle the paperwork and process of choosing a school. Pt reports she has verbalized needing help to partner. He reports trying to help her as best he can. Pt reports she is concerned partner is going to "work himself to death." Pt reports this is a stressor for her. Pt becomes tearful while talking about this stressor. Pt reports feeling like an "outsider" from her partner's family. Cln discussed using assertive communication with partner to share how she is feeling. Cln offered suggestion to partner to use fair fighting rules- take time for each other to share and jot down one word reminders. Pt wants to continue fair fighting rules discussion next session.    Suicidal/Homicidal: Nowithout intent/plan  Therapist Response: Cln asked how the client has been since last seen. Cln asked open ended questions about positive and/or negative changes that have occurred since CCA completed. Cln used fair fighting rules to discuss communication, as well as assertive communication discussion.  Cln used CBT to discuss thoughts and thought stopping. Cln assisted with scheduling next appointment.  Homework: Pt will discuss using fair fighting rule of providing each person 1-2 minutes to talk without interruption from the other person.   Pt will continue with self-care routines: skin-care, baths, meditation, at least 3x a week for 30 minutes. Pt will report use of assertive communication with partner on topics at least 3/7 conversations.  Pt will participate in having quality time with partner for at least 30 minutes a day 1 out  of 7 days a week.  Plan: Return again in 2 weeks.  Diagnosis: Bipolar 2, depressive    [name], [facility] 09/19/2020

## 2020-09-26 IMAGING — US US MFM OB DETAIL+14 WK
1 series · 13 of 28 positions shown · non-contrast
Comparison: none

[Series 1: us mfm ob detail+14 wk · 76 acquisitions, 13 frames shown]
[im 3/76]
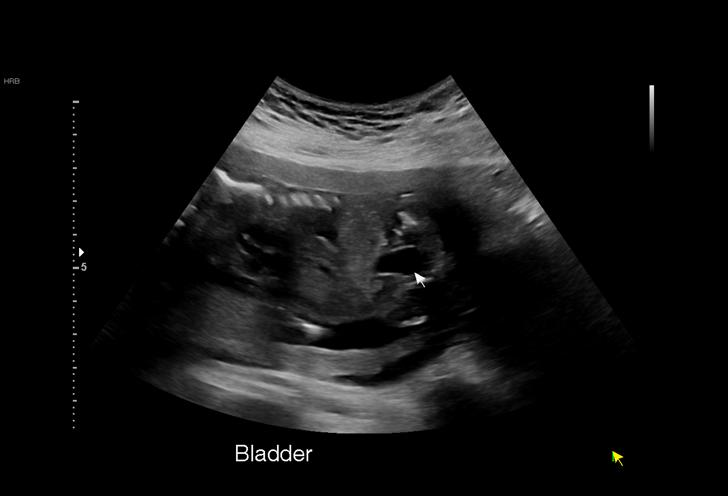
[im 9/76]
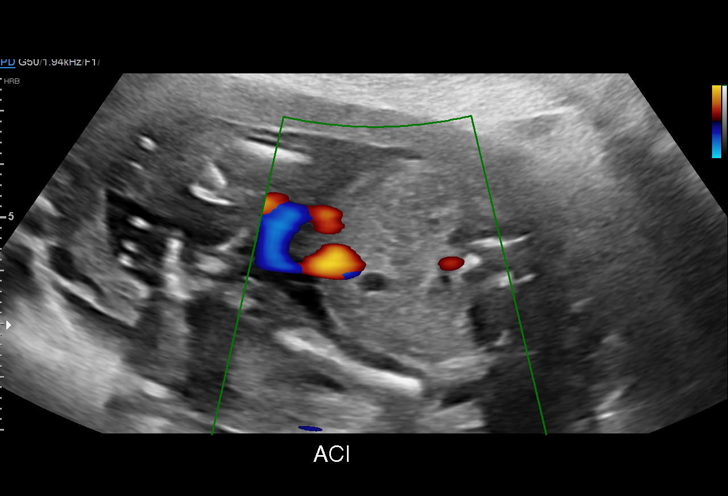
[im 14/76]
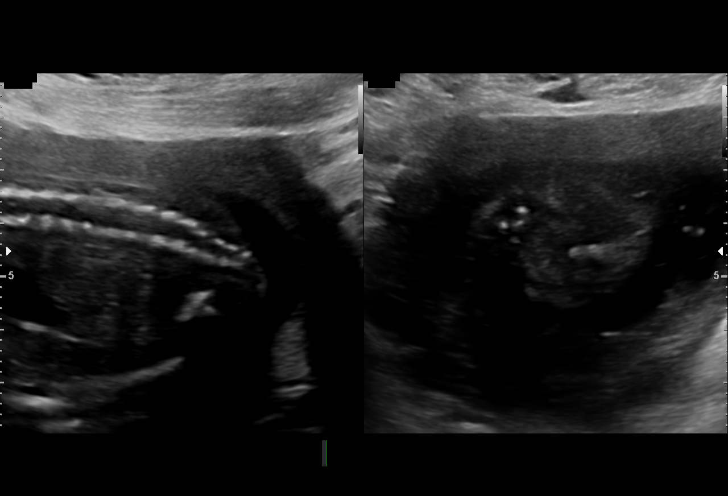
[im 20/76]
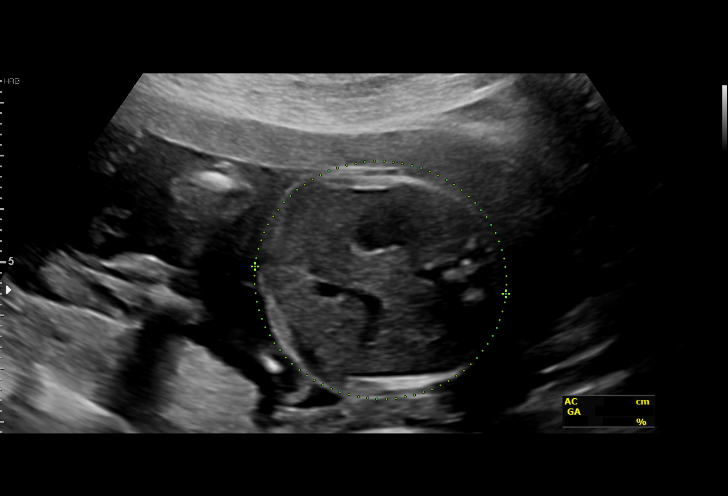
[im 26/76]
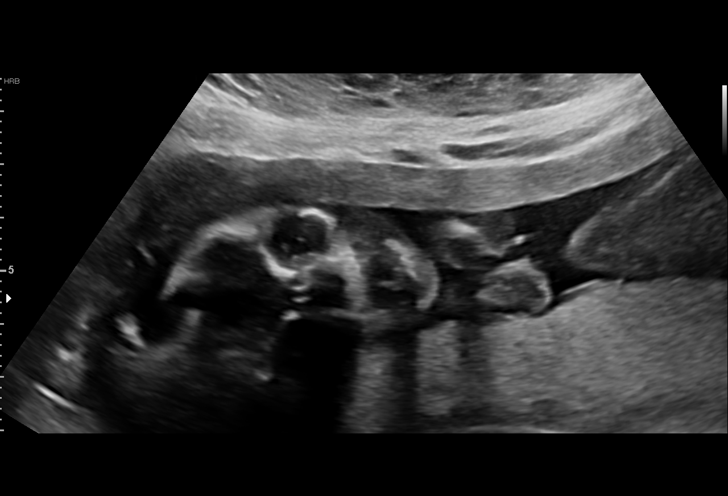
[im 31/76]
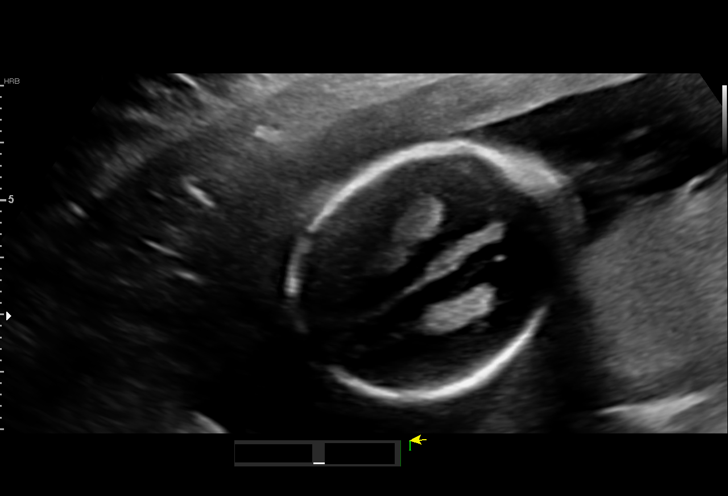
[im 39/76]
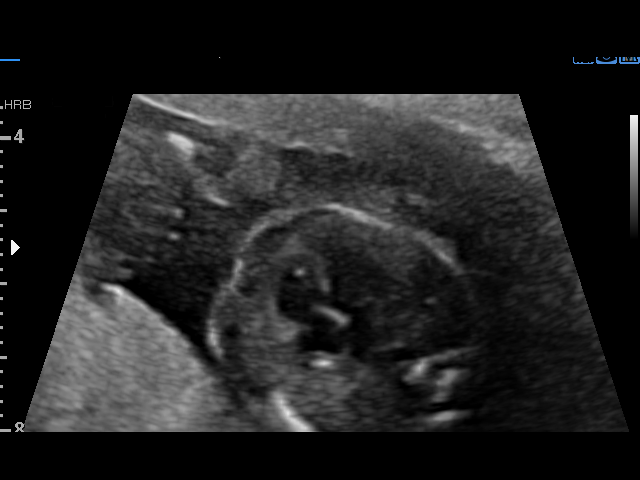
[im 45/76]
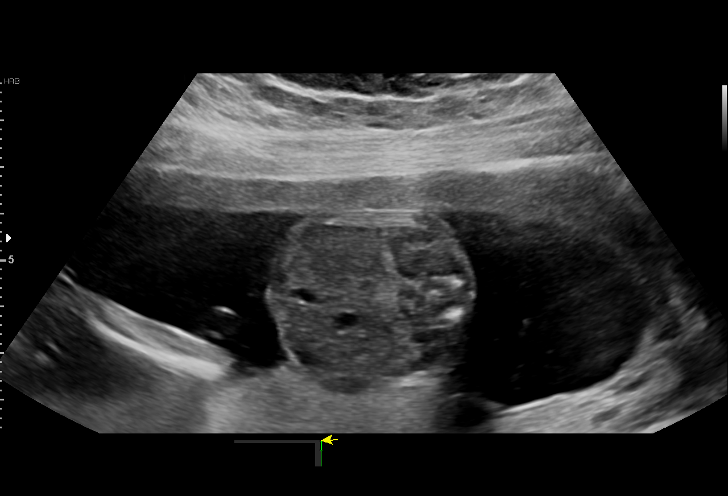
[im 51/76]
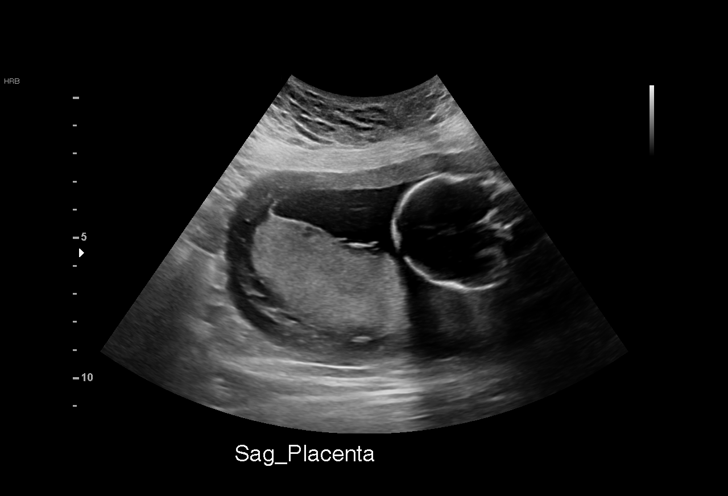
[im 56/76]
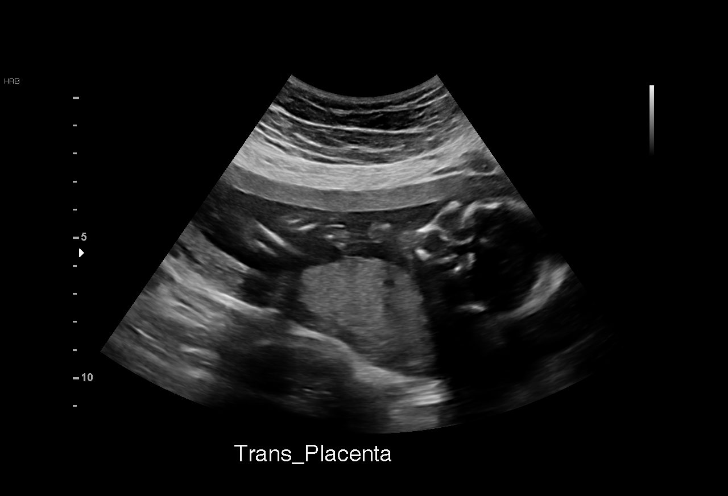
[im 62/76]
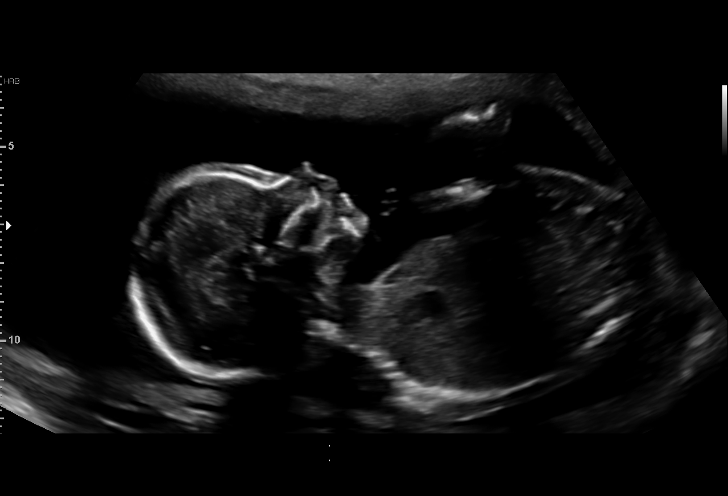
[im 67/76]
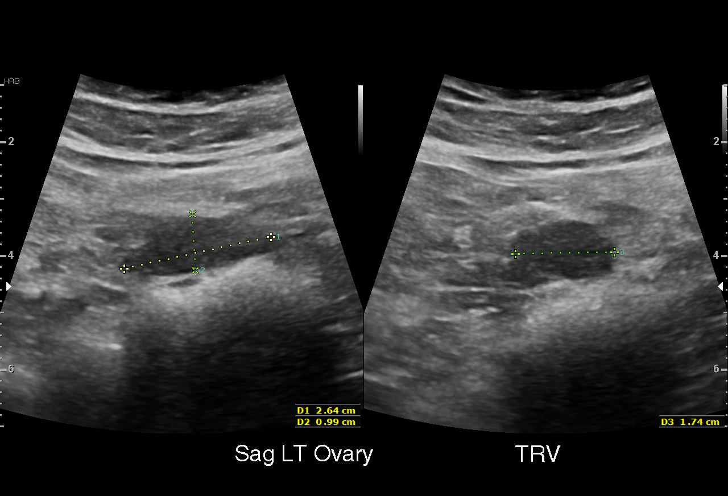
[im 73/76]
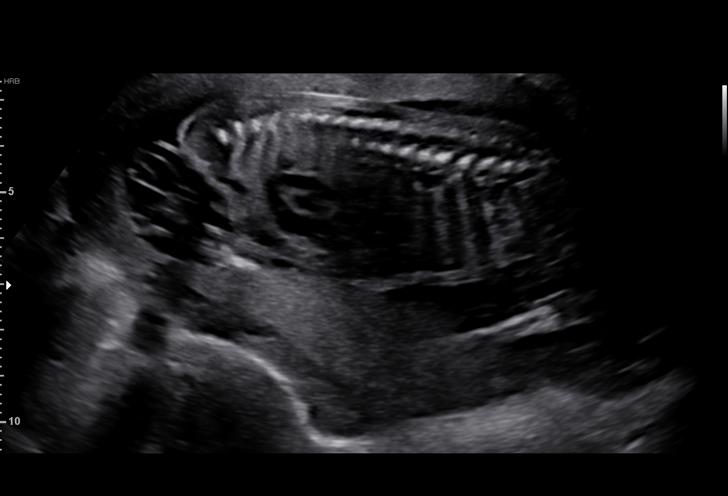

[13 of 28 positions shown; findings below may reference images not displayed]

----------------------------------------------------------------------

 ----------------------------------------------------------------------
Indications

  Advanced maternal age multigravida 35+,
  second trimester
  Marginal insertion of umbilical cord affecting
  management of mother in second trimester
  Previous cesarean delivery, antepartum
  Encounter for antenatal screening for
  malformations (Low risk NIPS)
  Prev heterotopic preg w/ectopic on Rt
  19 weeks gestation of pregnancy
 ----------------------------------------------------------------------
Vital Signs

 BMI:
Fetal Evaluation

 Num Of Fetuses:         1
 Fetal Heart Rate(bpm):  155
 Cardiac Activity:       Observed
 Presentation:           Breech
 Placenta:               Posterior
 P. Cord Insertion:      Marginal insertion 1.2cm

 Amniotic Fluid
 AFI FV:      Within normal limits

                             Largest Pocket(cm)

Biometry

 BPD:        40  mm     G. Age:  18w 1d         10  %    CI:        68.63   %    70 - 86
                                                         FL/HC:      19.9   %    16.1 -
 HC:      154.3  mm     G. Age:  18w 3d          9  %    HC/AC:      1.09        1.09 -
 AC:      141.8  mm     G. Age:  19w 4d         54  %    FL/BPD:     76.8   %
 FL:       30.7  mm     G. Age:  19w 4d         51  %    FL/AC:      21.7   %    20 - 24
 CER:      18.8  mm     G. Age:  18w 3d         25  %
 NFT:       3.1  mm
 LV:        5.8  mm
 CM:          5  mm

 Est. FW:     289  gm    0 lb 10 oz      51  %
Gestational Age

 LMP:           18w 3d        Date:  03/18/19                 EDD:   12/23/19
 U/S Today:     19w 0d                                        EDD:   12/19/19
 Best:          19w 2d     Det. By:  Early Ultrasound         EDD:   12/17/19
                                     (05/03/19)
Anatomy

 Cranium:               Appears normal         Aortic Arch:            Not well visualized
 Cavum:                 Appears normal         Ductal Arch:            Not well visualized
 Ventricles:            Appears normal         Diaphragm:              Appears normal
 Choroid Plexus:        Appears normal         Stomach:                Appears normal, left
                                                                       sided
 Cerebellum:            Appears normal         Abdomen:                Appears normal
 Posterior Fossa:       Appears normal         Abdominal Wall:         Appears nml (cord
                                                                       insert, abd wall)
 Nuchal Fold:           Appears normal         Cord Vessels:           Appears normal (3
                                                                       vessel cord)
 Face:                  Appears normal         Kidneys:                Appear normal
                        (orbits and profile)
 Lips:                  Not well visualized    Bladder:                Appears normal
 Thoracic:              Appears normal         Spine:                  Appears normal
 Heart:                 Not well visualized    Upper Extremities:      Appears normal
 RVOT:                  Not well visualized    Lower Extremities:      Appears normal
 LVOT:                  Appears normal

 Other:  Female gender Technically difficult due to maternal habitus and fetal
         position.
Cervix Uterus Adnexa

 Cervix
 Length:           3.97  cm.
 Normal appearance by transabdominal scan.

 Uterus
 No abnormality visualized.

 Left Ovary
 Within normal limits.

 Right Ovary
 Within normal limits.
 Cul De Sac
 No free fluid seen.

 Adnexa
 No abnormality visualized.
Comments

 This patient was seen for a detailed fetal anatomy scan due
 to advanced maternal age. She denies any significant past
 medical history and denies any problems in her current
 pregnancy.
 She had a cell free DNA test earlier in her pregnancy which
 indicated a low risk for trisomy 21, 18, and 13. A female fetus
 is predicted.
 She was informed that the fetal growth and amniotic fluid
 level were appropriate for her gestational age.
 The views of the fetal anatomy were limited today due to the
 fetal position.
 A possible marginal placental cord insertion was noted today.
 The increased risk of possible fetal growth issues associated
 with a marginal placental cord insertion was discussed.  We
 will reassess this finding at her next exam.
 The patient was informed that anomalies may be missed due
 to technical limitations. If the fetus is in a suboptimal position
 or maternal habitus is increased, visualization of the fetus in
 the maternal uterus may be impaired.
 The increased risk of fetal aneuploidy due to advanced
 maternal age was discussed. Due to advanced maternal age,
 the patient was offered and declined an amniocentesis today
 for definitive diagnosis of fetal aneuploidy.
 A follow-up exam was scheduled in 4 weeks.

## 2020-10-02 ENCOUNTER — Other Ambulatory Visit: Payer: Self-pay

## 2020-10-02 ENCOUNTER — Encounter (HOSPITAL_COMMUNITY): Payer: Self-pay | Admitting: Psychiatry

## 2020-10-02 ENCOUNTER — Telehealth (INDEPENDENT_AMBULATORY_CARE_PROVIDER_SITE_OTHER): Payer: Medicaid Other | Admitting: Psychiatry

## 2020-10-02 DIAGNOSIS — F900 Attention-deficit hyperactivity disorder, predominantly inattentive type: Secondary | ICD-10-CM | POA: Insufficient documentation

## 2020-10-02 DIAGNOSIS — F3181 Bipolar II disorder: Secondary | ICD-10-CM

## 2020-10-02 MED ORDER — AMPHETAMINE-DEXTROAMPHET ER 10 MG PO CP24: 10.0000 mg | ORAL_CAPSULE | Freq: Every morning | ORAL | 0 refills | Status: DC

## 2020-10-02 MED ORDER — ARIPIPRAZOLE 5 MG PO TABS: ORAL_TABLET | ORAL | 1 refills | Status: DC

## 2020-10-02 NOTE — Progress Notes (Signed)
Psychiatric Initial Adult Assessment   Virtual Visit via Video Note  I connected with Klani Caridi Moore on 10/02/20 at  9:20 AM EST by a video enabled telemedicine application and verified that I am speaking with the correct person using two identifiers.  Location: Patient: Home Provider: Clinic   I discussed the limitations of evaluation and management by telemedicine and the availability of in person appointments. The patient expressed understanding and agreed to proceed.  I provided 35 minutes of non-face-to-face time during this encounter.     Patient Identification: Tanya Moore MRN:  [id number] Date of Evaluation:  10/02/2020   Referral Source: PCP  Chief Complaint:   " I have been diagnosed with bipolar disorder and I also believe I have ADHD."  Visit Diagnosis:    ICD-10-CM   1. Bipolar 2 disorder, major depressive episode (HCC)  F31.81 ARIPiprazole (ABILIFY) 5 MG tablet  2. Attention deficit hyperactivity disorder (ADHD), predominantly inattentive type  F90.0 amphetamine-dextroamphetamine (ADDERALL XR) 10 MG 24 hr capsule    amphetamine-dextroamphetamine (ADDERALL XR) 10 MG 24 hr capsule    History of Present Illness: This is a 40 year old female with history of bipolar 2 disorder, anxiety now seen for evaluation.  Patient has recently started therapy services with Ms. Cobia in this clinic.  Patient informed that Tanya Moore has been prescribed several different antidepressants in the past and none of them really helped her.  Back in 2016 Tanya Moore was formally diagnosed with bipolar 2 disorder.  After seeing her primary care for the same Tanya Moore was eventually referred to [facility] for medication management.  Tanya Moore informed that they prescribed her a few medications but Tanya Moore did not feel that Tanya Moore was getting the right degree of help there. Tanya Moore stated that Tanya Moore was taking Latuda up until Tanya Moore found out that Tanya Moore is pregnant back in 2020.  Tanya Moore informed that Tanya Moore has 2 children a 41-monthold baby and  a 460year-old. Tanya Moore stated that ever since Tanya Moore had her baby Tanya Moore feels that her Tanya Moore has been having a very hard time focusing.  Tanya Moore stated that Tanya Moore feels her world is more chaotic than ever.  Tanya Moore feels that Tanya Moore cannot stay on task and her mind is wandering everywhere.  Tanya Moore has a hard time completing one task.  Tanya Moore feels very stressed out and frustrated because of that. Patient verbalized that Tanya Moore has always had difficulty with focusing and concentration however ever since Tanya Moore had her baby Tanya Moore has been feeling more disorganized and all over the place.  Tanya Moore asked if Tanya Moore can be evaluated for ADHD. Writer conducted adult ADHD self-report scale during the session and based on that patient may criteria for ADHD inattentive type.  Regarding her mood, patient stated that Tanya Moore mostly has depressive episodes however Tanya Moore has intermittent episodes of hypomania wherein Tanya Moore feels more creative than usual and can stay up for 1 or 2 nights in a row.  Tanya Moore stated that lately Tanya Moore has started meditating and that has helped a lot.  Tanya Moore feels that after Tanya Moore started meditating Tanya Moore is not as reactive to things that Tanya Moore was.  Lately Tanya Moore feels like Tanya Moore is numb to everything.  Tanya Moore has intermittent episodes of depression when Tanya Moore feels low in energy and has depressed mood.  Tanya Moore denied any frequent crying spells.  Tanya Moore sometimes feels helpless but denied feeling hopeless.  Tanya Moore denied any suicidal ideations.  Tanya Moore denied any prior suicide attempts.  Her appetite is poor and Tanya Moore does not eat much during  the daytime except for drinking a lot of caffeine during the day.  Tanya Moore is currently a stay at home while watching her 22-monthold baby.  Tanya Moore still breast-feeding.  Tanya Moore is thinking about weaning them off but has not really decided yet.  Tanya Moore lives with her partner and her 2 children.  Previous Psychotropic Medications: Regarding her past medications-Tanya Moore has taken Prozac, Lexapro in the past.  Did not feel that these medicines help. Tanya Moore  also took Lamictal however it caused her to have bumps in her skin in her armpits and in the groin area, it was not a rash however that made her concerned and Tanya Moore decided not to take it anymore. Tanya Moore has also taken trazodone, propranolol, Rexulti, clonidine that has not helped much. Tanya Moore is also taken Abilify in the past could not really remember if it helped or not.  Tanya Moore denies recalling any side effects associated with it.  Past Psychiatric History: History of anxiety, was diagnosed with bipolar disorder back in 2016.  Has been tried on several her medications.  [facility]  Stopped taking medications after Tanya Moore found out Tanya Moore is pregnant in 2020.  Substance Abuse History in the last 12 months:  No.  Consequences of Substance Abuse: NA  Past Medical History:  Past Medical History:  Diagnosis Date  . Allergy   . Anxiety   . [place] 1 [place]   . Depression   . Ectopic pregnancy with intrauterine pregnancy/Heterotopic Pregnancy 05/03/2019   S/p laparoscopic right salpingectomy on 05/03/19 05/03/19 SURGICAL PATHOLOGY FALLOPIAN TUBE WITH ECTOPIC PREGNANCY, RIGHT, SALPINGECTOMY:  - Benign fallopian tube with chorionic villi consistent with ectopic pregnancy.   . Headache   . Hemoperitoneum due to rupture of right tubal ectopic pregnancy 05/03/2019  . History of ectopic pregnancy in 11/2018 11/20/2018   Mtx#1 given 4/3: Hcg day 0 (147), day 4 (180), day 7 (193) Mtx # 2 given 4/9 (193)->4/12 (153)->4/15 (79)  . Hyperlipidemia   . Infection    UTI  . Pregnancy induced hypertension   . Psoriasis 2020    Past Surgical History:  Procedure Laterality Date  . APPENDECTOMY    . BUNIONECTOMY    . CESAREAN SECTION  2017  . CESAREAN SECTION N/A 11/27/2019   Procedure: CESAREAN SECTION AND IUD INSERTION;  Surgeon: EChancy Milroy MD;  Location: [facility] LD [facility]S;  Service: Obstetrics;  Laterality: N/A;  . DIAGNOSTIC LAPAROSCOPY WITH REMOVAL OF ECTOPIC PREGNANCY N/A 05/03/2019    Procedure: RIGHT SALPINGECTOMY AND REMOVAL OF ECTOPIC PREGNANCY;  Surgeon: AOsborne Oman MD;  [place] [facility]  Service: Gynecology;  Laterality: N/A;  . TONSILLECTOMY      Family Psychiatric History: sister- depression  Family History:  Family History  Problem Relation Age of Onset  . Hypertension Mother   . Hypertension Father   . Cancer Father        prostate  . Depression Sister     Social History:   Social History   Socioeconomic History  . Marital status: Single    Spouse name: Not on file  . Number of children: Not on file  . Years of education: Not on file  . Highest education level: Not on file  Occupational History  . Not on file  Tobacco Use  . Smoking status: Former Smoker    Quit date: 2017    Years since quitting: 5.1  . Smokeless tobacco: Never Used  Vaping Use  . Vaping Use: Never used  Substance and Sexual Activity  . Alcohol use: No  . Drug use: No  . Sexual activity: Not Currently    Birth control/protection: None  Other Topics Concern  . Not on file  Social History Narrative  . Not on file   Social Determinants of Health   Financial Resource Strain: Not on file  Food Insecurity: No Food Insecurity  . Worried About Charity fundraiser in the Last Year: Never true  . Ran Out of Food in the Last Year: Never true  Transportation Needs: No Transportation Needs  . Lack of Transportation (Medical): No  . Lack of Transportation (Non-Medical): No  Physical Activity: Not on file  Stress: Not on file  Social Connections: Not on file    Additional Social History: Lives with partner and 2 kids- 26 y/o and 32 month old  Allergies:   Allergies  Allergen Reactions  . Lamictal [Lamotrigine] Rash    Patient reported    Metabolic Disorder Labs: Lab Results  Component Value Date   HGBA1C 4.9 05/26/2019   No results found for: PROLACTIN Lab Results  Component Value Date   CHOL 305 (H) 06/05/2020   TRIG 94 06/05/2020   HDL 61  06/05/2020   CHOLHDL 5.0 (H) 06/05/2020   LDLCALC 228 (H) 06/05/2020   LDLCALC 163 (H) 02/14/2019   No results found for: TSH  Therapeutic Level Labs: No results found for: LITHIUM No results found for: CBMZ No results found for: VALPROATE  Current Medications: Current Outpatient Medications  Medication Sig Dispense Refill  . amphetamine-dextroamphetamine (ADDERALL XR) 10 MG 24 hr capsule Take 1 capsule (10 mg total) by mouth in the morning. 30 capsule 0  . [START ON 10/30/2020] amphetamine-dextroamphetamine (ADDERALL XR) 10 MG 24 hr capsule Take 1 capsule (10 mg total) by mouth in the morning. 30 capsule 0  . ARIPiprazole (ABILIFY) 5 MG tablet Take half tablet daily (2.5 mg) 30 tablet 1  . acetaminophen (TYLENOL) 325 MG tablet Take 2 tablets (650 mg total) by mouth every 4 (four) hours as needed (for pain scale < 4  OR  temperature  >/=  100.5 F). 30 tablet 0  . Blood Pressure Monitoring (BLOOD PRESSURE KIT) DEVI 1 Device by Does not apply route as needed. 1 Device 0  . Calcium Carb-Cholecalciferol (CALCIUM + D3) 600-200 MG-UNIT TABS Take 1 tablet by mouth daily.     . cefadroxil (DURICEF) 500 MG capsule Take 1 capsule (500 mg total) by mouth 2 (two) times daily. 14 capsule 0  . ferrous sulfate 325 (65 FE) MG tablet Take 1 tablet (325 mg total) by mouth every other day. 30 tablet 2  . ibuprofen (ADVIL) 800 MG tablet Take 1 tablet (800 mg total) by mouth every 8 (eight) hours. 30 tablet 0  . lurasidone (LATUDA) 20 MG TABS tablet Take 1 tablet (20 mg total) by mouth daily. Take at night with food. 30 tablet 1  . Prenatal Vit-Fe Fumarate-FA (PRENATAL MULTIVITAMIN) TABS tablet Take 1 tablet by mouth daily at 12 noon.    Marland Kitchen terconazole (TERAZOL 7) 0.4 % vaginal cream Place 1 applicator vaginally at bedtime. Use for seven days 45 g 0   No current facility-administered medications for this visit.    Musculoskeletal: Strength & Muscle Tone: within normal limits Gait & Station: normal Patient  leans: N/A  Psychiatric Specialty Exam: Review of Systems  currently breastfeeding.There is no height or weight on file to calculate BMI.  General Appearance: Fairly Groomed  Eye Contact:  Good  Speech:  Clear and Coherent and Normal Rate  Volume:  Normal  Mood:  Euthymic  Affect:  Congruent  Thought Process:  Goal Directed and Descriptions of Associations: Intact  Orientation:  Full (Time, Place, and Person)  Thought Content:  Logical  Suicidal Thoughts:  No  Homicidal Thoughts:  No  Memory:  Immediate;   Good Recent;   Good  Judgement:  Fair  Insight:  Fair  Psychomotor Activity:  Normal  Concentration:  Concentration: Good and Attention Span: Good  Recall:  Good  Fund of Knowledge:Good  Language: Good  Akathisia:  Negative  Handed:  Right  AIMS (if indicated):  Not indicated  Assets:  Communication Skills Desire for Improvement Financial Resources/Insurance [place] Talents/Skills Transportation Vocational/Educational  ADL's:  Intact  Cognition: WNL  Sleep:  Poor   Screenings: GAD-7   Flowsheet Row Routine Prenatal from 11/14/2019 in [place] for [facility] Routine Prenatal from 11/07/2019 in [place] for [facility]  Total GAD-7 Score 9 9    PHQ2-9   Flowsheet Row Video Visit from 10/02/2020 in [facility] Counselor from 09/05/2020 in [facility] Routine Prenatal from 11/14/2019 in [place] for [facility] Routine Prenatal from 11/07/2019 in [place] for [facility]  PHQ-2 Total Score 1 1 1 1   PHQ-9 Total Score - - 7 8    Flowsheet Row Video Visit from 10/02/2020 in [place] No Risk      Assessment and Plan: Based on patient's assessment, Tanya Moore meets her here for bipolar 2 disorder and ADHD inattentive type.  Patient is willing to retry Abilify for mood stabilization.  Tanya Moore would  also like to try Adderall XR for ADHD symptoms. Potential side effects of medication and risks vs benefits of treatment vs non-treatment were explained and discussed. All questions were answered. Writer discussed at length that both his medications can be secreted in breast milk since Tanya Moore is still lactating Tanya Moore will need to consider weaning the baby off before Tanya Moore starts taking these medications.  Patient verbalized understanding.  Writer advised that Tanya Moore takes a few weeks to make the decision and then once Tanya Moore is no longer breast-feeding Tanya Moore can start taking the medications.  1. Bipolar 2 disorder, major depressive episode (HCC)  - ARIPiprazole (ABILIFY) 5 MG tablet; Take half tablet daily (2.5 mg)  Dispense: 30 tablet; Refill: 1  2. Attention deficit hyperactivity disorder (ADHD), predominantly inattentive type  - amphetamine-dextroamphetamine (ADDERALL XR) 10 MG 24 hr capsule; Take 1 capsule (10 mg total) by mouth in the morning.  Dispense: 30 capsule; Refill: 0 - amphetamine-dextroamphetamine (ADDERALL XR) 10 MG 24 hr capsule; Take 1 capsule (10 mg total) by mouth in the morning.  Dispense: 30 capsule; Refill: 0  Recommended to start the medications after Tanya Moore stops lactating her 43-monthold baby. F/up in 2 months. Continue therapy with ms. Cobia.   MNevada Crane MD 2/23/202210:00 AM

## 2020-10-04 ENCOUNTER — Other Ambulatory Visit: Payer: Self-pay

## 2020-10-04 ENCOUNTER — Ambulatory Visit (INDEPENDENT_AMBULATORY_CARE_PROVIDER_SITE_OTHER): Payer: Medicaid Other | Admitting: Professional

## 2020-10-04 DIAGNOSIS — F900 Attention-deficit hyperactivity disorder, predominantly inattentive type: Secondary | ICD-10-CM

## 2020-10-04 DIAGNOSIS — F3181 Bipolar II disorder: Secondary | ICD-10-CM

## 2020-10-04 NOTE — Progress Notes (Signed)
Virtual Visit via Video Note  I connected with Tanya Moore on 10/04/20 at 11:00 AM EST by a video enabled telemedicine application and verified that I am speaking with the correct person using two identifiers.  Location: Patient: Home Provider: Clinical Home Office   I discussed the limitations of evaluation and management by telemedicine and the availability of in person appointments. The patient expressed understanding and agreed to proceed.  Follow Up Instructions:    I discussed the assessment and treatment plan with the patient. The patient was provided an opportunity to ask questions and all were answered. The patient agreed with the plan and demonstrated an understanding of the instructions.   The patient was advised to call back or seek an in-person evaluation if the symptoms worsen or if the condition fails to improve as anticipated.  I provided 63 minutes of non-face-to-face time during this encounter.   [name], [facility]    THERAPIST PROGRESS NOTE  Session Time: 11a  Participation Level: Active  Behavioral Response: CasualAlertAnxious and Depressed  Type of Therapy: Individual Therapy  Treatment Goals addressed: Depression  Interventions: CBT, DBT, Solution Focused, Strength-based, Supportive and Reframing  Summary: Tanya Moore is a 40 y.o. female who presents with depression symptoms.  Pt reports things have been "OK" since last meeting with this cln. Pt reports she is proud of self for not "blowing up" at dentist office after going up there to pick up x-rays as told over the phone to do. Pt reports she is trying to meditate over the kids' nap time and gotten back into skin care routine for self-care. Pt has been taking an hour bath 1x a week for self-care and that is helping. Pt is working on getting her child into a different school for next year. Pt is frustrated since she is having to handle it by herself since partner is busy with work. Partner is  aware of the issues at school and is open to discussion but is not the one to handle the paperwork and process of choosing a school. Pt reports she has verbalized needing help to partner. He reports trying to help her as best he can. Pt reports she is concerned partner is going to "work himself to death." Pt reports this is a stressor for her. Pt becomes tearful while talking about this stressor. Pt reports feeling like an "outsider" from her partner's family. Cln discussed using assertive communication with partner to share how she is feeling. Cln offered suggestion to partner to use fair fighting rules- take time for each other to share and jot down one word reminders. Pt wants to continue fair fighting rules discussion next session.    Suicidal/Homicidal: Nowithout intent/plan  Therapist Response: Cln asked how the client has been since last seen. Cln asked open ended questions about positive and/or negative changes that have occurred since CCA completed. Cln used fair fighting rules to discuss communication, as well as assertive communication discussion.  Cln used CBT to discuss thoughts and thought stopping. Cln assisted with scheduling next appointment.  Homework: Pt will try to set boundaries with mother and understand that push back is normal.   Pt will continue with self-care routines: skin-care, baths, meditation, at least 3x a week for 30 minutes. Pt will report use of assertive communication with partner on topics at least 3/7 conversations.  Pt will participate in having quality time with partner for at least 30 minutes a day 1 out of 7 days a week.  Plan: Return again in 2 weeks.  Diagnosis: Bipolar 2, depressive    [name], [facility] 10/04/2020

## 2020-10-10 ENCOUNTER — Ambulatory Visit (HOSPITAL_COMMUNITY): Payer: Self-pay | Admitting: Licensed Clinical Social Worker

## 2020-10-16 ENCOUNTER — Ambulatory Visit (INDEPENDENT_AMBULATORY_CARE_PROVIDER_SITE_OTHER): Payer: Medicaid Other | Admitting: Professional

## 2020-10-16 ENCOUNTER — Other Ambulatory Visit: Payer: Self-pay

## 2020-10-16 DIAGNOSIS — F3181 Bipolar II disorder: Secondary | ICD-10-CM

## 2020-10-16 NOTE — Progress Notes (Signed)
Virtual Visit via Video Note  I connected with Tanya Moore on 10/16/20 at 11:00 AM EST by a video enabled telemedicine application and verified that I am speaking with the correct person using two identifiers.  Location: Patient: Home Provider: Clinical Home Office   I discussed the limitations of evaluation and management by telemedicine and the availability of in person appointments. The patient expressed understanding and agreed to proceed.  Follow Up Instructions:    I discussed the assessment and treatment plan with the patient. The patient was provided an opportunity to ask questions and all were answered. The patient agreed with the plan and demonstrated an understanding of the instructions.   The patient was advised to call back or seek an in-person evaluation if the symptoms worsen or if the condition fails to improve as anticipated.  I provided 45 minutes of non-face-to-face time during this encounter.   [name], [facility]    THERAPIST PROGRESS NOTE  Session Time: 11a  Participation Level: Active  Behavioral Response: CasualAlertAnxious and Depressed  Type of Therapy: Individual Therapy  Treatment Goals addressed: Depression  Interventions: CBT, DBT, Solution Focused, Strength-based, Supportive and Reframing  Summary: Tanya Moore is a 40 y.o. female who presents with depression symptoms.  Pt reports she has pulled her son out of Pre-K due to getting son being injured and not being notified. Pt reports she is working on getting him into [employer] for next year. Pt reports she confronted Mom about discussing sister's child that is nonbinary. Pt reports mother became defensive in the conversation and hung up. Pt has spoken with mom since then; mother tried to act like nothing happened. Pt confronted her on pretending things don't happen; mother shut down again. Pt reports she will continue to do boundaries with her. Cln and pt spend time discussing  continued issues with setting boundaries. Pt reports partner is continuing to be supportive in helping take care of children so pt can get self-care time. Pt reports she is getting 20-30 minutes a night for self-care. Pt continues to practice assertive communication with partner. Pt reports it is going well but she is struggling some with not being passive-aggressive at times. Pt reports she is seeing a difference with the volume level and length of disagreements. Cln and pt spend time discussing continued use of assertive communication. Pt reports partner has reported he is seeing a difference. Cln and pt spend time discussing the 4 main characteristics of a healthy relationship (trust, respect, honesty, communication) and how these things relate to their relationships. Pt and cln spend time discussing how to address her son finding out his father is not his biological father and that bio father is deceased. Pt denies SI/HI/AVH.  Suicidal/Homicidal: Nowithout intent/plan  Therapist Response: Cln asked how the client has been since last seen. Cln asked open ended questions about positive and/or negative changes that have occurred since last seen. Cln used active listening to understand and validate patient. Cln used CBT to discuss continued use of boundaries, relationships, and thought stopping. Cln assisted with scheduling next appointment.  Homework: Pt will try to set boundaries with mother and understand that push back is normal.   Pt will continue with self-care routines: skin-care, baths, meditation, at least 3x a week for 30 minutes. Pt will report use of assertive communication with partner on topics at least 3/7 conversations.  Pt will participate in having quality time with partner for at least 30 minutes a day 2 out of  7 days a week, including outside of the house 1x a month.  Plan: Return again in 2 weeks.  Diagnosis: Bipolar 2, depressive    [name], [facility] 10/16/2020

## 2020-10-31 ENCOUNTER — Ambulatory Visit: Payer: Self-pay

## 2020-10-31 ENCOUNTER — Telehealth (HOSPITAL_COMMUNITY): Payer: Self-pay | Admitting: *Deleted

## 2020-10-31 NOTE — Telephone Encounter (Signed)
Call from [facility] pharmacy to report patients concern that she is unable to break or cut the 5 mg Abilify tablet in half, its too small and it goes to pieces. [facility] states they do not break or cut pills for the patients on request. Will forward concern to Dr Evelene Croon to see what can be done to make the med more manageable for her.

## 2020-10-31 NOTE — Telephone Encounter (Signed)
Has she stopped breast feeding her baby ? If yes, then she can start taking whole tablet daily.

## 2020-10-31 NOTE — Telephone Encounter (Signed)
She is in the process of weaning her child and her goal date to stop nursing is next month. Told per Dr to continue with half a pill and just try to cut it best she can and get all the pieces and dust of the half she can to be as accurate as she can with the 2.5 mg dose and when she is done nursing to move to the whole pill. She is in need of a new rx. Will let the dr know to call it in.

## 2020-10-31 NOTE — Telephone Encounter (Signed)
She should have a refill in the pharmacy.

## 2020-11-01 ENCOUNTER — Ambulatory Visit (INDEPENDENT_AMBULATORY_CARE_PROVIDER_SITE_OTHER): Payer: Medicaid Other | Admitting: Professional

## 2020-11-01 ENCOUNTER — Other Ambulatory Visit: Payer: Self-pay

## 2020-11-01 DIAGNOSIS — F3181 Bipolar II disorder: Secondary | ICD-10-CM

## 2020-11-01 NOTE — Progress Notes (Signed)
Virtual Visit via Video Note  I connected with Tanya Moore on 10/16/20 at 11:00 AM EDT by a video enabled telemedicine application and verified that I am speaking with the correct person using two identifiers.  Location: Patient: Home Provider: Clinical Home Office   I discussed the limitations of evaluation and management by telemedicine and the availability of in person appointments. The patient expressed understanding and agreed to proceed.  Follow Up Instructions:    I discussed the assessment and treatment plan with the patient. The patient was provided an opportunity to ask questions and all were answered. The patient agreed with the plan and demonstrated an understanding of the instructions.   The patient was advised to call back or seek an in-person evaluation if the symptoms worsen or if the condition fails to improve as anticipated.  I provided 22 minutes of non-face-to-face time during this encounter.   [name], [facility]    THERAPIST PROGRESS NOTE  Session Time: 11a  Participation Level: Active  Behavioral Response: CasualAlertAnxious and Depressed  Type of Therapy: Individual Therapy  Treatment Goals addressed: Depression  Interventions: CBT, DBT, Solution Focused, Strength-based, Supportive and Reframing  Summary: Tanya Moore is a 40 y.o. female who presents with depression symptoms. Pt reports things have been going "good." Pt reports son got into charter school which is an anxiety reducer. Pt reports they had a memorial service for cat and partner's friend that passed. Pt reports she feels the medications are helping and making a difference; some issues with Abilify and breastfeeding; pt spoke with medication provider about it yesterday. Pt reports she is having a root canal next week and is looking forward to not having pain.   Pt reports things are still tense with mom but trying to set up and maintain boundaries. Pt reports she and partner  continue to make more time for/with each other. Cln and pt discuss pausing therapy at this time since pt feels she is doing well. Pt reports she is feeling good about taking a break until August when this cln returns from maternity leave and pt's children are back in school. Pt denies SI/HI/AVH. Cln agrees.Pt denies SI/HI/AVH.  Suicidal/Homicidal: Nowithout intent/plan  Therapist Response: Cln asked how the client has been since last seen. Cln asked open ended questions about positive and/or negative changes that have occurred since last seen. Cln used active listening to understand and validate patient. Cln assisted with scheduling next appointment.  Homework: Pt will continue to try to set boundaries with mother and understand that push back is normal.   Pt will continue with self-care routines: skin-care, baths, meditation, at least 3x a week for 30 minutes. Pt will report use of assertive communication with partner on topics at least 3/7 conversations.  Pt will participate in having quality time with partner for at least 30 minutes a day 2 out of 7 days a week, including outside of the house 1x a month.  Plan: Return again in 4-5 months.  Diagnosis: Bipolar 2, depressive    [name], [facility] 11/01/2020

## 2020-11-07 ENCOUNTER — Ambulatory Visit
Admission: RE | Admit: 2020-11-07 | Discharge: 2020-11-07 | Disposition: A | Discharge status: Home / Self Care | Payer: Medicaid Other | Source: Ambulatory Visit | Attending: Family Medicine | Admitting: Family Medicine

## 2020-11-07 ENCOUNTER — Other Ambulatory Visit: Payer: Self-pay

## 2020-11-07 DIAGNOSIS — Z1231 Encounter for screening mammogram for malignant neoplasm of breast: Secondary | ICD-10-CM

## 2020-11-15 ENCOUNTER — Ambulatory Visit (HOSPITAL_COMMUNITY): Payer: Medicaid Other | Admitting: Professional

## 2020-11-25 ENCOUNTER — Telehealth (HOSPITAL_COMMUNITY): Payer: Self-pay

## 2020-11-25 NOTE — Telephone Encounter (Signed)
Spoke with pharmacy and they're switching/filling patient's Aripiprazole from 5mg  to 2mg  tablet; take 1 tablet qd #30 1 refill. Also, per dr, patient is not to take Adderall while she's breastfeeding. Relayed message to pharmacist & she stated that they have not and will not refill it and there are no refills so patient is good on that

## 2020-11-25 NOTE — Telephone Encounter (Signed)
Pharmacy called regarding patient's Aripiprazole 5mg . Pharmacy stated that patient is still breastfeeding and that when patient tries to cut the tablet in half to take 2.5mg  it crumbles. Therefore, pharmacy asked if patient could take 2mg  instead since it doesn't come in 2.5mg . Checked with dr verbally & she stated that it was ok for the patient to take 2mg  instead

## 2020-11-26 ENCOUNTER — Other Ambulatory Visit (HOSPITAL_COMMUNITY): Payer: Self-pay | Admitting: *Deleted

## 2020-11-26 ENCOUNTER — Telehealth (HOSPITAL_COMMUNITY): Payer: Self-pay | Admitting: *Deleted

## 2020-11-26 MED ORDER — ARIPIPRAZOLE 2 MG PO TABS
ORAL_TABLET | ORAL | 1 refills | Status: DC
Start: 2020-11-26 — End: 2020-12-05

## 2020-11-26 MED ORDER — ARIPIPRAZOLE 5 MG PO TABS
5.0000 mg | ORAL_TABLET | Freq: Every day | ORAL | 2 refills | Status: DC
Start: 2020-11-26 — End: 2020-11-26

## 2020-11-26 NOTE — Telephone Encounter (Signed)
Call from patient, tone was stressed and said she cant cont to eat dust" which is what she has left when she tries to break the 5 mg and states she had a melt down in the pharmacy yesterday when she didn't have a new rx to pick up. Contacted Dr Evelene Croon re this and she gave me a verbal order to call in.

## 2020-12-04 ENCOUNTER — Encounter: Payer: Self-pay | Admitting: Family Medicine

## 2020-12-05 ENCOUNTER — Telehealth (INDEPENDENT_AMBULATORY_CARE_PROVIDER_SITE_OTHER): Payer: Medicaid Other | Admitting: Psychiatry

## 2020-12-05 ENCOUNTER — Other Ambulatory Visit: Payer: Self-pay

## 2020-12-05 ENCOUNTER — Encounter (HOSPITAL_COMMUNITY): Payer: Self-pay | Admitting: Psychiatry

## 2020-12-05 DIAGNOSIS — F900 Attention-deficit hyperactivity disorder, predominantly inattentive type: Secondary | ICD-10-CM | POA: Diagnosis not present

## 2020-12-05 DIAGNOSIS — F3181 Bipolar II disorder: Secondary | ICD-10-CM

## 2020-12-05 MED ORDER — AMPHETAMINE-DEXTROAMPHET ER 20 MG PO CP24: 20.0000 mg | ORAL_CAPSULE | Freq: Every morning | ORAL | 0 refills | Status: DC

## 2020-12-05 MED ORDER — ARIPIPRAZOLE 5 MG PO TABS: 5.0000 mg | ORAL_TABLET | Freq: Every day | ORAL | 1 refills | Status: DC

## 2020-12-05 NOTE — Progress Notes (Signed)
[facility] Progress Note  Virtual Visit via Video Note  I connected with Tanya Moore on 12/05/20 at 11:00 AM EDT by a video enabled telemedicine application and verified that I am speaking with the correct person using two identifiers.  Location: Patient: Home Provider: Clinic   I discussed the limitations of evaluation and management by telemedicine and the availability of in person appointments. The patient expressed understanding and agreed to proceed.  I provided 16 minutes of non-face-to-face time during this encounter.    Patient Identification: Tanya Moore MRN:  [id number] Date of Evaluation:  12/05/2020    Chief Complaint:   " We all got COVID."  Visit Diagnosis:    ICD-10-CM   1. Bipolar 2 disorder, major depressive episode ([place])  F31.81   2. Attention deficit hyperactivity disorder (ADHD), predominantly inattentive type  F90.0     History of Present Illness: Patient informed that she and her whole family recently contracted COVID and now recovering from that.  She stated that all of them including her 2 young children have mild upper respiratory and symptoms and they are dealing with those for now. She informed that she tried the Adderall XR 10 mg dose after she stopped lactating and she did not really find it to be helpful.  She stated that maybe the dose does not last that long in her system.  She was agreeable to trying a higher dose to see if that would help her better. Regarding her Abilify dosage, patient was initially taking 5 mg however was cutting it into half as she was lactating.  However when she was cutting into half it was difficult because the medicine would disintegrate.  As a result a new prescription for 2 mg Abilify was sent by the writer on April 19.  Now that she is not lactating anymore for the past 1 and half months she wants to go back to the whole tablet dose.  She stated that low-dose of 2 mg is not as effective as 5 mg dose was.  When she was  taking 5 mg dose her mood was more stable and she was not having any frequent crying spells.  She would like to return back to 5 mg dose daily. She stated that overall her symptoms are manageable and she would like to keep the combination of Abilify and Adderall with increased doses for now.   Previous Psychotropic Medications: Regarding her past medications-she has taken Prozac, Lexapro in the past.  Did not feel that these medicines help. She also took Lamictal however it caused her to have bumps in her skin in her armpits and in the groin area, it was not a rash however that made her concerned and she decided not to take it anymore. She has also taken trazodone, propranolol, Rexulti, clonidine that has not helped much. She is also taken Abilify in the past could not really remember if it helped or not.  She denies recalling any side effects associated with it.  Past Psychiatric History: History of anxiety, was diagnosed with bipolar disorder back in 2016.  Has been tried on several her medications.  Was being seen by PCP followed by [facility].  Stopped taking medications after she found out she is pregnant in 2020.  Substance Abuse History in the last 12 months:  No.  Consequences of Substance Abuse: NA  Past Medical History:  Past Medical History:  Diagnosis Date  . Allergy   . Anxiety   . Bipolar 1 disorder (  [place])   . Depression   . Ectopic pregnancy with intrauterine pregnancy/Heterotopic Pregnancy 05/03/2019   S/p laparoscopic right salpingectomy on 05/03/19 05/03/19 SURGICAL PATHOLOGY FALLOPIAN TUBE WITH ECTOPIC PREGNANCY, RIGHT, SALPINGECTOMY:  - Benign fallopian tube with chorionic villi consistent with ectopic pregnancy.   . Headache   . Hemoperitoneum due to rupture of right tubal ectopic pregnancy 05/03/2019  . History of ectopic pregnancy in 11/2018 11/20/2018   Mtx#1 given 4/3: Hcg day 0 (147), day 4 (180), day 7 (193) Mtx # 2 given 4/9 (193)->4/12 (153)->4/15 (79)  .  Hyperlipidemia   . Infection    UTI  . Pregnancy induced hypertension   . Psoriasis 2020    Past Surgical History:  Procedure Laterality Date  . APPENDECTOMY    . BUNIONECTOMY    . CESAREAN SECTION  2017  . CESAREAN SECTION N/A 11/27/2019   Procedure: CESAREAN SECTION AND IUD INSERTION;  Surgeon: Chancy Milroy, MD;  Location: [facility];  Service: Obstetrics;  Laterality: N/A;  . DIAGNOSTIC LAPAROSCOPY WITH REMOVAL OF ECTOPIC PREGNANCY N/A 05/03/2019   Procedure: RIGHT SALPINGECTOMY AND REMOVAL OF ECTOPIC PREGNANCY;  Surgeon: Osborne Oman, MD;  Location: [facility];  Service: Gynecology;  Laterality: N/A;  . TONSILLECTOMY      Family Psychiatric History: sister- depression  Family History:  Family History  Problem Relation Age of Onset  . Hypertension Mother   . Hypertension Father   . Cancer Father        prostate  . Depression Sister     Social History:   Social History   Socioeconomic History  . Marital status: Single    Spouse name: Not on file  . Number of children: Not on file  . Years of education: Not on file  . Highest education level: Not on file  Occupational History  . Not on file  Tobacco Use  . Smoking status: Former Smoker    Quit date: 2017    Years since quitting: 5.3  . Smokeless tobacco: Never Used  Vaping Use  . Vaping Use: Never used  Substance and Sexual Activity  . Alcohol use: No  . Drug use: No  . Sexual activity: Not Currently    Birth control/protection: None  Other Topics Concern  . Not on file  Social History Narrative  . Not on file   Social Determinants of Health   Financial Resource Strain: Not on file  Food Insecurity: Not on file  Transportation Needs: Not on file  Physical Activity: Not on file  Stress: Not on file  Social Connections: Not on file    Additional Social History: Lives with partner and 2 kids- 13 y/o and 55 month old  Allergies:   Allergies  Allergen Reactions  . Lamictal [Lamotrigine] Rash     Patient reported    Metabolic Disorder Labs: Lab Results  Component Value Date   HGBA1C 4.9 05/26/2019   No results found for: PROLACTIN Lab Results  Component Value Date   CHOL 305 (H) 06/05/2020   TRIG 94 06/05/2020   HDL 61 06/05/2020   CHOLHDL 5.0 (H) 06/05/2020   LDLCALC 228 (H) 06/05/2020   LDLCALC 163 (H) 02/14/2019   No results found for: TSH  Therapeutic Level Labs: No results found for: LITHIUM No results found for: CBMZ No results found for: VALPROATE  Current Medications: Current Outpatient Medications  Medication Sig Dispense Refill  . acetaminophen (TYLENOL) 325 MG tablet Take 2 tablets (650 mg total) by mouth every 4 (  four) hours as needed (for pain scale < 4  OR  temperature  >/=  100.5 F). 30 tablet 0  . amphetamine-dextroamphetamine (ADDERALL XR) 10 MG 24 hr capsule Take 1 capsule (10 mg total) by mouth in the morning. 30 capsule 0  . amphetamine-dextroamphetamine (ADDERALL XR) 10 MG 24 hr capsule Take 1 capsule (10 mg total) by mouth in the morning. 30 capsule 0  . ARIPiprazole (ABILIFY) 2 MG tablet Take one tab q day 30 tablet 1  . Blood Pressure Monitoring (BLOOD PRESSURE KIT) DEVI 1 Device by Does not apply route as needed. 1 Device 0  . Calcium Carb-Cholecalciferol (CALCIUM + D3) 600-200 MG-UNIT TABS Take 1 tablet by mouth daily.     . cefadroxil (DURICEF) 500 MG capsule Take 1 capsule (500 mg total) by mouth 2 (two) times daily. 14 capsule 0  . ferrous sulfate 325 (65 FE) MG tablet Take 1 tablet (325 mg total) by mouth every other day. 30 tablet 2  . ibuprofen (ADVIL) 800 MG tablet Take 1 tablet (800 mg total) by mouth every 8 (eight) hours. 30 tablet 0  . lurasidone (LATUDA) 20 MG TABS tablet Take 1 tablet (20 mg total) by mouth daily. Take at night with food. 30 tablet 1  . Prenatal Vit-Fe Fumarate-FA (PRENATAL MULTIVITAMIN) TABS tablet Take 1 tablet by mouth daily at 12 noon.    Marland Kitchen terconazole (TERAZOL 7) 0.4 % vaginal cream Place 1 applicator  vaginally at bedtime. Use for seven days 45 g 0   No current facility-administered medications for this visit.    Musculoskeletal: Strength & Muscle Tone: within normal limits Gait & Station: normal Patient leans: N/A  Psychiatric Specialty Exam: Review of Systems  currently breastfeeding.There is no height or weight on file to calculate BMI.  General Appearance: Fairly Groomed  Eye Contact:  Good  Speech:  Clear and Coherent and Normal Rate  Volume:  Normal  Mood:  Euthymic  Affect:  Congruent  Thought Process:  Goal Directed and Descriptions of Associations: Intact  Orientation:  Full (Time, Place, and Person)  Thought Content:  Logical  Suicidal Thoughts:  No  Homicidal Thoughts:  No  Memory:  Immediate;   Good Recent;   Good  Judgement:  Fair  Insight:  Fair  Psychomotor Activity:  Normal  Concentration:  Concentration: Good and Attention Span: Good  Recall:  Good  Fund of Knowledge:Good  Language: Good  Akathisia:  Negative  Handed:  Right  AIMS (if indicated):  Not indicated  Assets:  Communication Skills Desire for Improvement Financial Resources/Insurance [place] Talents/Skills Transportation Vocational/Educational  ADL's:  Intact  Cognition: WNL  Sleep:  Poor   Screenings: GAD-7   Flowsheet Row Routine Prenatal from 11/14/2019 in [place] for [facility] Routine Prenatal from 11/07/2019 in [place] for [facility]  Total GAD-7 Score 9 9    PHQ2-9   Flowsheet Row Video Visit from 10/02/2020 in [facility] from 09/05/2020 in [facility] Routine Prenatal from 11/14/2019 in [place] for [facility] Routine Prenatal from 11/07/2019 in [place] for [facility]  PHQ-2 Total Score 1 1 1 1   PHQ-9 Total Score -- -- 7 8    Flowsheet Row Video Visit from 10/02/2020 in [place] No Risk      Assessment and Plan: Patient appears to be doing fairly well however when the dose was reduced down to 2  mg patient noticed that her crying spells return back and she would like to return back to 5 mg dosage.  She also requested increasing her dose of Adderall XR 10 mg as it did not seem to be effective.  Doses of both medicines have been adjusted as below. Potential side effects of medication and risks vs benefits of treatment vs non-treatment were explained and discussed. All questions were answered.   1. Bipolar 2 disorder, major depressive episode ([facility])  - Adjust dose back to ARIPiprazole (ABILIFY) 5 MG tablet; Take 1 tablet (5 mg total) by mouth daily.  Dispense: 30 tablet; Refill: 1  2. Attention deficit hyperactivity disorder (ADHD), predominantly inattentive type  - Increase amphetamine-dextroamphetamine (ADDERALL XR) 20 MG 24 hr capsule; Take 1 capsule (20 mg total) by mouth in the morning.  Dispense: 30 capsule; Refill: 0 - amphetamine-dextroamphetamine (ADDERALL XR) 20 MG 24 hr capsule; Take 1 capsule (20 mg total) by mouth in the morning.  Dispense: 30 capsule; Refill: 0  F/up in 2 months.    [place] Crane, MD 4/28/202210:56 AM

## 2021-01-15 IMAGING — US US MFM OB FOLLOW-UP
1 series · 13 of 28 positions shown · non-contrast
Comparison: none

[Series 1: us mfm ob follow-up · 62 acquisitions, 13 frames shown]
[im 3/62]
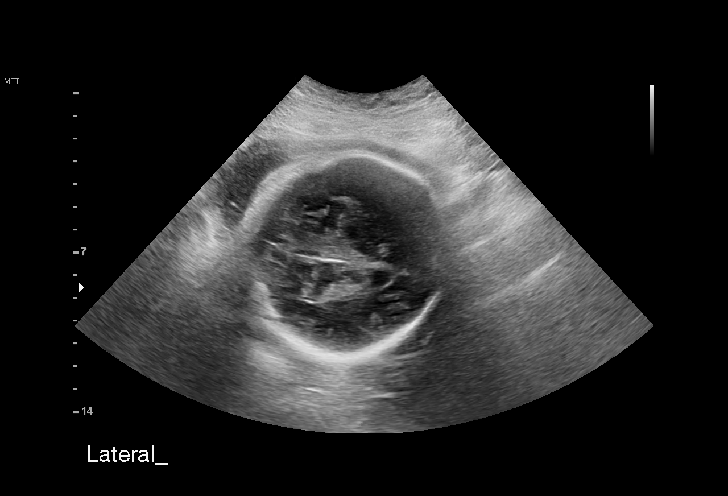
[im 7/62]
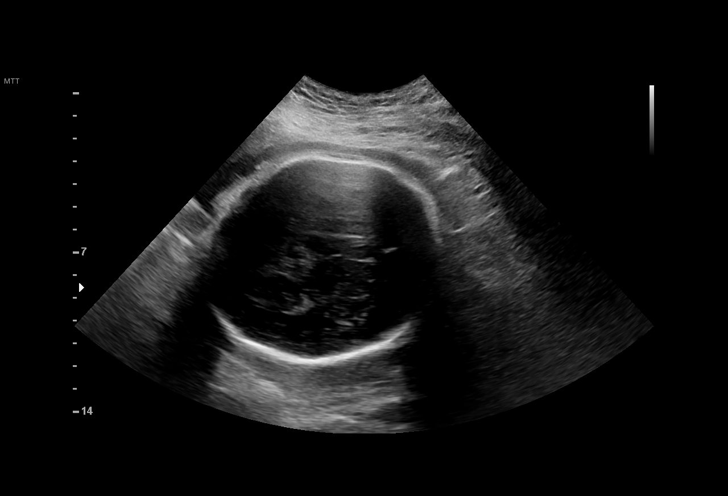
[im 12/62]
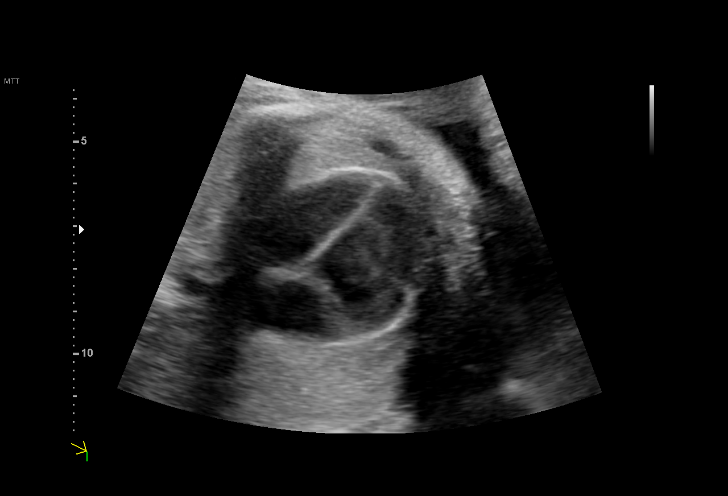
[im 16/62]
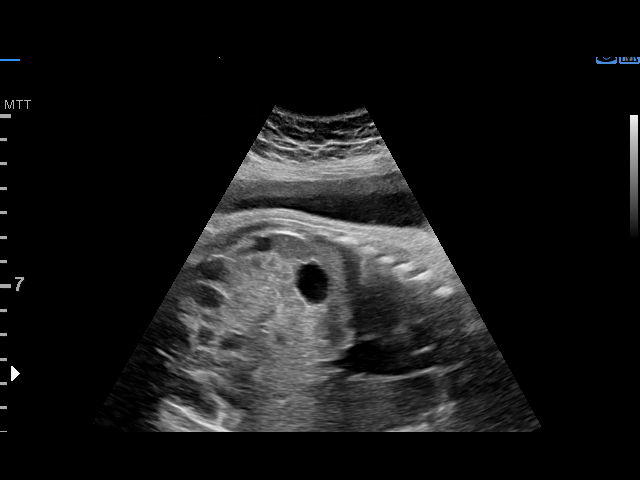
[im 21/62]
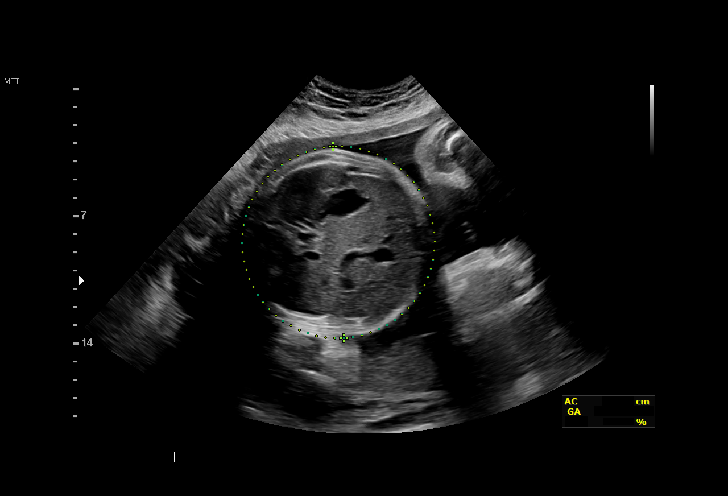
[im 25/62]
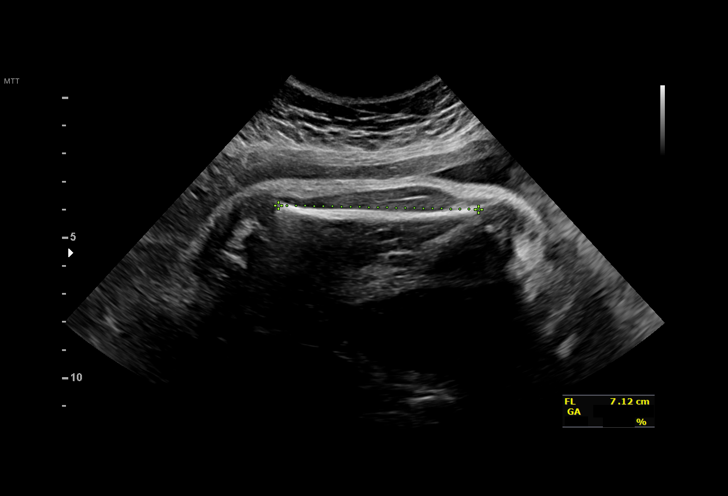
[im 32/62]
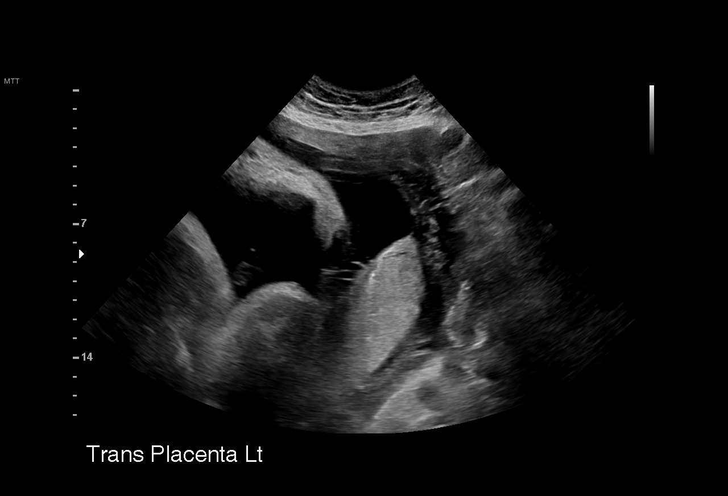
[im 37/62]
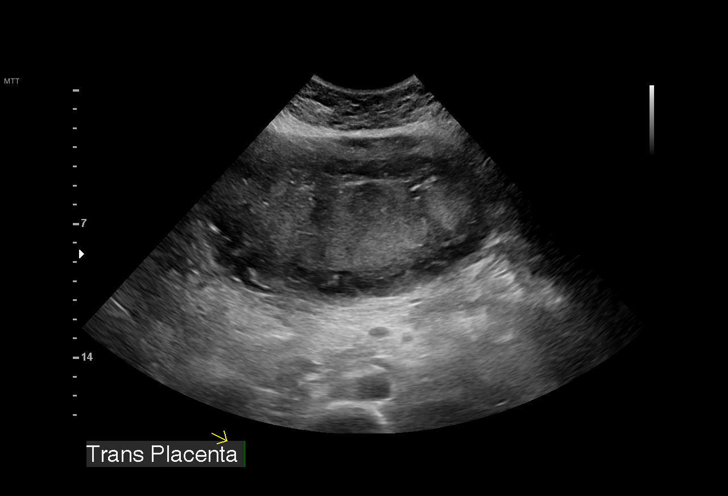
[im 41/62]
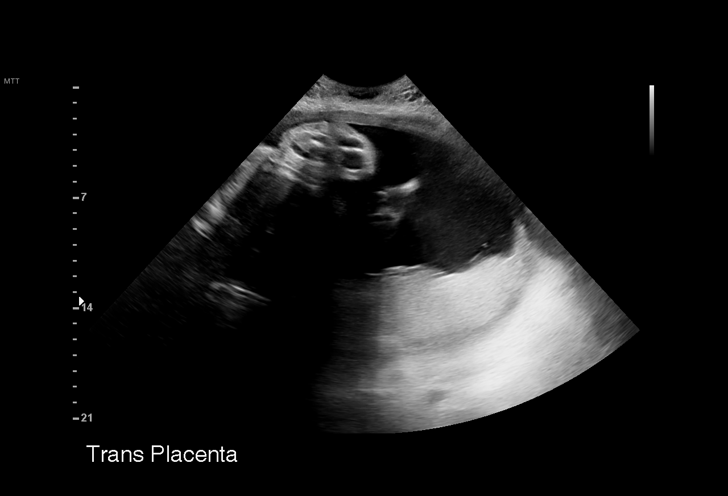
[im 46/62]
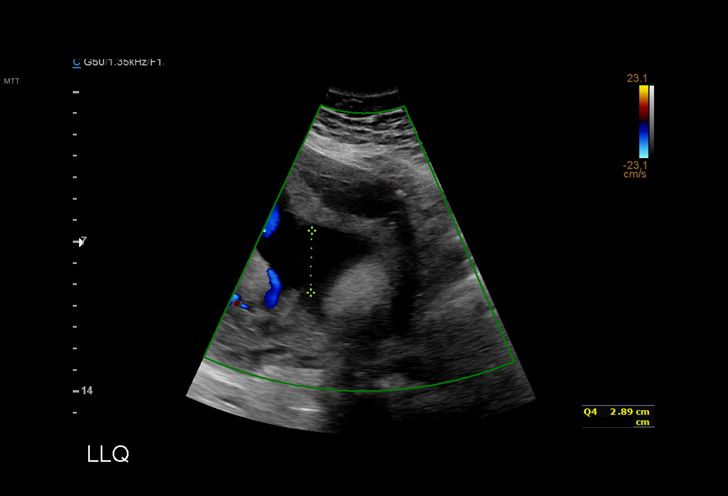
[im 50/62]
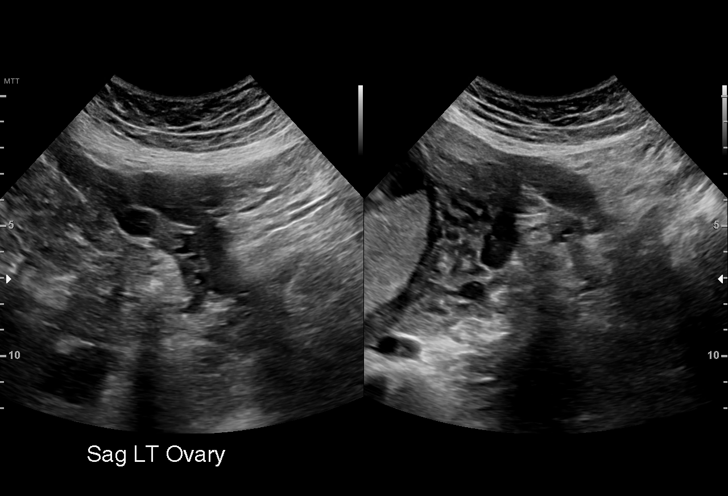
[im 55/62]
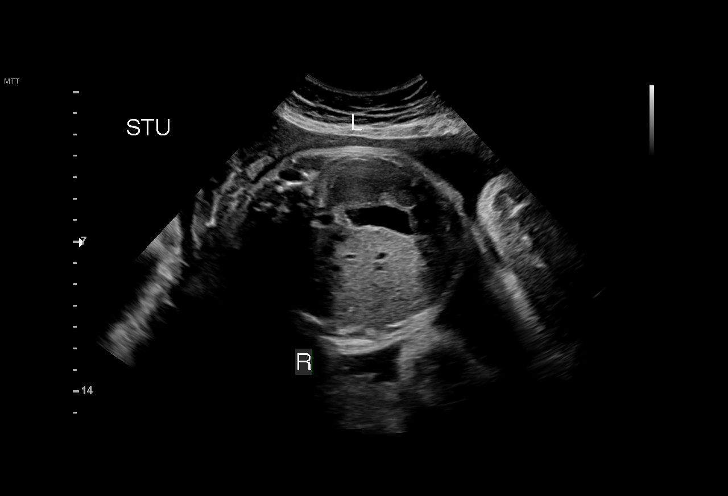
[im 59/62]
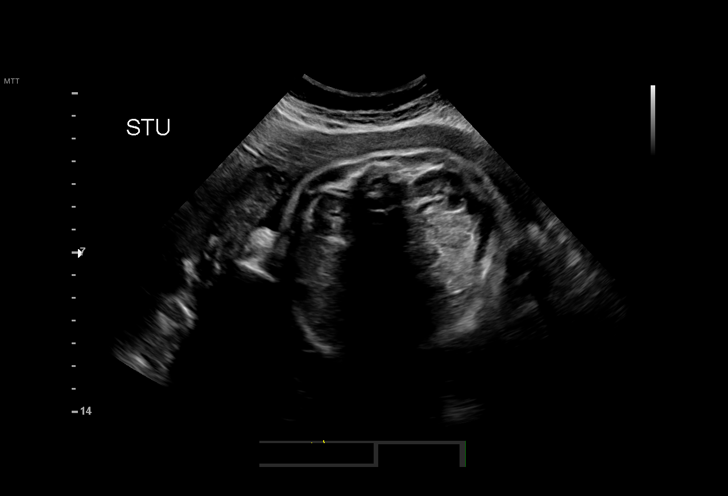

[13 of 28 positions shown; findings below may reference images not displayed]

----------------------------------------------------------------------

 ----------------------------------------------------------------------
Indications

  Hypertension - Gestational
  35 weeks gestation of pregnancy
  Uterine size-date discrepancy, third trimester
  Advanced maternal age multigravida 35+,
  third trimester
  Marginal insertion of umbilical cord affecting
  management of mother in third trimester
  Previous cesarean delivery, antepartum
  Prev heterotopic preg w/ectopic on Rt
  Encounter for other antenatal screening
  follow-up
 ----------------------------------------------------------------------
Vital Signs

                                                Height:        5'4"
Fetal Evaluation

 Num Of Fetuses:         1
 Fetal Heart Rate(bpm):  142
 Cardiac Activity:       Observed
 Presentation:           Cephalic
 Placenta:               Posterior
 P. Cord Insertion:      Visualized, central

 Amniotic Fluid
 AFI FV:      Subjectively upper-normal
 AFI Sum(cm)     %Tile       Largest Pocket(cm)
 21.39           81

 RUQ(cm)       RLQ(cm)       LUQ(cm)        LLQ(cm)

Biophysical Evaluation

 Amniotic F.V:   Pocket => 2 cm             F. Tone:        Observed
 F. Movement:    Observed                   Score:          [DATE]
 F. Breathing:   Observed
Biometry

 BPD:      88.5  mm     G. Age:  35w 5d         71  %    CI:        80.62   %    70 - 86
                                                         FL/HC:      23.2   %    20.1 -
 HC:      311.3  mm     G. Age:  34w 6d         12  %    HC/AC:      0.95        0.93 -
 AC:      328.7  mm     G. Age:  36w 5d         92  %    FL/BPD:     81.5   %    71 - 87
 FL:       72.1  mm     G. Age:  36w 6d         85  %    FL/AC:      21.9   %    20 - 24
 LV:        2.8  mm

 Est. FW:    7888  gm      6 lb 8 oz     83  %
Gestational Age

 LMP:           34w 2d        Date:  03/18/19                 EDD:   12/23/19
 U/S Today:     36w 0d                                        EDD:   12/11/19
 Best:          35w 1d     Det. By:  Early Ultrasound         EDD:   12/17/19
                                     (05/03/19)
Anatomy

 Cranium:               Previously seen        Aortic Arch:            Previously seen
 Cavum:                 Previously seen        Ductal Arch:            Previously seen
 Ventricles:            Appears normal         Diaphragm:              Appears normal
 Choroid Plexus:        Previously seen        Stomach:                Appears normal, left
                                                                       sided
 Cerebellum:            Previously seen        Abdomen:                Previously seen
 Posterior Fossa:       Previously seen        Abdominal Wall:         Previously seen
 Nuchal Fold:           Previously seen        Cord Vessels:           Previously seen
 Face:                  Orbits and profile     Kidneys:                Appear normal
                        previously seen
 Lips:                  Previously seen        Bladder:                Appears normal
 Thoracic:              Previously seen        Spine:                  Previously seen
 Heart:                 Previously seen        Upper Extremities:      Previously seen
 RVOT:                  Previously seen        Lower Extremities:      Previously seen
 LVOT:                  Previously seen

 Other:  Female gender Technically difficult due to maternal habitus and fetal
         position. Technically difficult due to advanced gestational age.
Cervix Uterus Adnexa

 Cervix
 Not visualized (advanced GA >09wks)

 Uterus
 No abnormality visualized.

 Left Ovary
 Within normal limits. No adnexal mass visualized.
 Right Ovary
 Within normal limits. No adnexal mass visualized.

 Cul De Sac
 No free fluid seen.

 Adnexa
 No abnormality visualized.
Comments

 This patient was seen for a follow up exam as her fundal
 heights have been measuring larger than her dates.  Her
 current pregnancy has also been complicated by gestational
 hypertension that is not currently treated with any
 medications.
 She was informed that the fetal growth and amniotic fluid
 level appears appropriate for her gestational age.
 A biophysical profile performed today was [DATE].
 Due to gestational hypertension, we will continue to follow her
 with weekly fetal testing.  Another biophysical profile was
 scheduled in 1 week.

## 2021-01-30 ENCOUNTER — Telehealth (HOSPITAL_COMMUNITY): Payer: Self-pay | Admitting: *Deleted

## 2021-01-30 NOTE — Telephone Encounter (Signed)
VM from patient stating she doesn't feel her mood stabilizer is doing enough for her, she thinks the mood stabilizer medicine is Abilify and would like a " dose uppage" Will forward concern to Dr Evelene Croon for her consideration.

## 2021-01-30 NOTE — Telephone Encounter (Signed)
Pt is scheduled to be seen for f/up on June 28 at 1 pm. I will be happy to discuss this concern at the time of her scheduled appt. Thanks.

## 2021-02-04 ENCOUNTER — Encounter (HOSPITAL_COMMUNITY): Payer: Self-pay | Admitting: Psychiatry

## 2021-02-04 ENCOUNTER — Other Ambulatory Visit: Payer: Self-pay

## 2021-02-04 ENCOUNTER — Telehealth (INDEPENDENT_AMBULATORY_CARE_PROVIDER_SITE_OTHER): Payer: Medicaid Other | Admitting: Psychiatry

## 2021-02-04 DIAGNOSIS — F3181 Bipolar II disorder: Secondary | ICD-10-CM

## 2021-02-04 DIAGNOSIS — F900 Attention-deficit hyperactivity disorder, predominantly inattentive type: Secondary | ICD-10-CM | POA: Diagnosis not present

## 2021-02-04 MED ORDER — AMPHETAMINE-DEXTROAMPHET ER 20 MG PO CP24: 20.0000 mg | ORAL_CAPSULE | Freq: Every morning | ORAL | 0 refills | Status: DC

## 2021-02-04 MED ORDER — SERTRALINE HCL 50 MG PO TABS: 50.0000 mg | ORAL_TABLET | Freq: Every day | ORAL | 2 refills | Status: DC

## 2021-02-04 MED ORDER — ARIPIPRAZOLE 5 MG PO TABS: 5.0000 mg | ORAL_TABLET | Freq: Every day | ORAL | 2 refills | Status: DC

## 2021-02-04 NOTE — Progress Notes (Signed)
BH OP Progress Note  Virtual Visit via Video Note  I connected with Tanya Moore on 02/04/21 at  1:00 PM EDT by a video enabled telemedicine application and verified that I am speaking with the correct person using two identifiers.  Location: Patient: Home Provider: Clinic   I discussed the limitations of evaluation and management by telemedicine and the availability of in person appointments. The patient expressed understanding and agreed to proceed.  I provided 16 minutes of non-face-to-face time during this encounter.     Patient Identification: Tanya Moore MRN:  [id number] Date of Evaluation:  02/04/2021    Chief Complaint:   " I have been getting quite irritable lately."  Visit Diagnosis:    ICD-10-CM   1. Bipolar 2 disorder, major depressive episode (HCC)  F31.81 ARIPiprazole (ABILIFY) 5 MG tablet    sertraline (ZOLOFT) 50 MG tablet    2. Attention deficit hyperactivity disorder (ADHD), predominantly inattentive type  F90.0 amphetamine-dextroamphetamine (ADDERALL XR) 20 MG 24 hr capsule    amphetamine-dextroamphetamine (ADDERALL XR) 20 MG 24 hr capsule    amphetamine-dextroamphetamine (ADDERALL XR) 20 MG 24 hr capsule      History of Present Illness: Patient stated that for the past few weeks she has noticed that she is getting irritable lately.  She stated that she gets easily annoyed and sometimes things go out of control.  She stated that she thinks she gets short easily.  She gave an example of how she felt like she went flying off the handle with her son who is 41 years old when he did something that was age-appropriate. She stated that she also has been crying easily a lot lately.  She sometimes feels like she does not have much energy to do anything. She denies any suicidal or homicidal ideations. She stated that she was wondering if her dose of Abilify could be adjusted to a higher dose. Based on everything the patient reported, writer recommended that  instead of going up on the dose of Abilify she will benefit for if we add an antidepressant to her regimen.  Writer asked if she is taking any antidepressants in the past and she informed she is taken Prozac, Lexapro in the past she also recalls taking Zoloft in the past. She recalled doing fairly okay with Zoloft and stated that she is willing to try that again. Potential side effects of medication and risks vs benefits of treatment vs non-treatment were explained and discussed. All questions were answered.  Regarding her ADHD medication, she stated that sometimes her concentration is not that great and she feels she is forgetful but she thinks that could be because of her mood not being well controlled. Writer agreed with her and recommended that we adjust her mood medications first and if she still continues to have difficulty in concentration we can adjust the dose of Adderall XR in the future. Patient was agreeable with this plan.   Previous Psychotropic Medications: Regarding her past medications-she has taken Prozac, Zoloft, Lexapro in the past.  Did not feel that these medicines helped much, but denied any adverse reactions. She also took Lamictal however it caused her to have bumps in her skin in her armpits and in the groin area, it was not a rash however that made her concerned and she decided not to take it anymore. She has also taken trazodone, propranolol, Rexulti, clonidine that has not helped much. She is also taken Abilify in the past could not really remember if  it helped or not.  She denies recalling any side effects associated with it.  Past Psychiatric History: History of anxiety, was diagnosed with bipolar disorder back in 2016.  Has been tried on several her medications.  Was being seen by PCP followed by [facility].  Stopped taking medications after she found out she is pregnant in 2020.  Substance Abuse History in the last 12 months:  No.  Consequences of Substance  Abuse: NA  Past Medical History:  Past Medical History:  Diagnosis Date   Allergy    Anxiety    Bipolar 1 disorder (HCC)    Depression    Ectopic pregnancy with intrauterine pregnancy/Heterotopic Pregnancy 05/03/2019   S/p laparoscopic right salpingectomy on 05/03/19 05/03/19 SURGICAL PATHOLOGY FALLOPIAN TUBE WITH ECTOPIC PREGNANCY, RIGHT, SALPINGECTOMY:  - Benign fallopian tube with chorionic villi consistent with ectopic pregnancy.    Headache    Hemoperitoneum due to rupture of right tubal ectopic pregnancy 05/03/2019   History of ectopic pregnancy in 11/2018 11/20/2018   Mtx#1 given 4/3: Hcg day 0 (147), day 4 (180), day 7 (193) Mtx # 2 given 4/9 (193)->4/12 (153)->4/15 (79)   Hyperlipidemia    Infection    UTI   Pregnancy induced hypertension    Psoriasis 2020    Past Surgical History:  Procedure Laterality Date   APPENDECTOMY     BUNIONECTOMY     CESAREAN SECTION  2017   CESAREAN SECTION N/A 11/27/2019   Procedure: CESAREAN SECTION AND IUD INSERTION;  Surgeon: Hermina Staggers, MD;  Location: [facility];  Service: Obstetrics;  Laterality: N/A;   DIAGNOSTIC LAPAROSCOPY WITH REMOVAL OF ECTOPIC PREGNANCY N/A 05/03/2019   Procedure: RIGHT SALPINGECTOMY AND REMOVAL OF ECTOPIC PREGNANCY;  Surgeon: Tereso Newcomer, MD;  Location: [facility] OR;  Service: Gynecology;  Laterality: N/A;   TONSILLECTOMY      Family Psychiatric History: sister- depression  Family History:  Family History  Problem Relation Age of Onset   Hypertension Mother    Hypertension Father    Cancer Father        prostate   Depression Sister     Social History:   Social History   Socioeconomic History   Marital status: Single    Spouse name: Not on file   Number of children: Not on file   Years of education: Not on file   Highest education level: Not on file  Occupational History   Not on file  Tobacco Use   Smoking status: Former    Pack years: 0.00    Types: Cigarettes    Quit date: 2017    Years  since quitting: 5.4   Smokeless tobacco: Never  Vaping Use   Vaping Use: Never used  Substance and Sexual Activity   Alcohol use: No   Drug use: No   Sexual activity: Not Currently    Birth control/protection: None  Other Topics Concern   Not on file  Social History Narrative   Not on file   Social Determinants of Health   Financial Resource Strain: Not on file  Food Insecurity: Not on file  Transportation Needs: Not on file  Physical Activity: Not on file  Stress: Not on file  Social Connections: Not on file    Additional Social History: Lives with partner and 2 kids- 4 y/o and 66 month old  Allergies:   Allergies  Allergen Reactions   Lamictal [Lamotrigine] Rash    Patient reported    Metabolic Disorder Labs: Lab Results  Component Value Date   HGBA1C 4.9 05/26/2019   No results found for: PROLACTIN Lab Results  Component Value Date   CHOL 305 (H) 06/05/2020   TRIG 94 06/05/2020   HDL 61 06/05/2020   CHOLHDL 5.0 (H) 06/05/2020   LDLCALC 228 (H) 06/05/2020   LDLCALC 163 (H) 02/14/2019   No results found for: TSH  Therapeutic Level Labs: No results found for: LITHIUM No results found for: CBMZ No results found for: VALPROATE  Current Medications: Current Outpatient Medications  Medication Sig Dispense Refill   [START ON 04/05/2021] amphetamine-dextroamphetamine (ADDERALL XR) 20 MG 24 hr capsule Take 1 capsule (20 mg total) by mouth in the morning. 30 capsule 0   sertraline (ZOLOFT) 50 MG tablet Take 1 tablet (50 mg total) by mouth daily with breakfast. 30 tablet 2   amphetamine-dextroamphetamine (ADDERALL XR) 20 MG 24 hr capsule Take 1 capsule (20 mg total) by mouth in the morning. 30 capsule 0   [START ON 03/06/2021] amphetamine-dextroamphetamine (ADDERALL XR) 20 MG 24 hr capsule Take 1 capsule (20 mg total) by mouth in the morning. 30 capsule 0   ARIPiprazole (ABILIFY) 5 MG tablet Take 1 tablet (5 mg total) by mouth daily. 30 tablet 2   Calcium  Carb-Cholecalciferol (CALCIUM + D3) 600-200 MG-UNIT TABS Take 1 tablet by mouth daily.      No current facility-administered medications for this visit.    Musculoskeletal: Strength & Muscle Tone: within normal limits Gait & Station: normal Patient leans: N/A  Psychiatric Specialty Exam: Review of Systems  currently breastfeeding.There is no height or weight on file to calculate BMI.  General Appearance: Fairly Groomed  Eye Contact:  Good  Speech:  Clear and Coherent and Normal Rate  Volume:  Normal  Mood:  Euthymic  Affect:  Congruent  Thought Process:  Goal Directed and Descriptions of Associations: Intact  Orientation:  Full (Time, Place, and Person)  Thought Content:  Logical  Suicidal Thoughts:  No  Homicidal Thoughts:  No  Memory:  Immediate;   Good Recent;   Good  Judgement:  Fair  Insight:  Fair  Psychomotor Activity:  Normal  Concentration:  Concentration: Good and Attention Span: Good  Recall:  Good  Fund of Knowledge:Good  Language: Good  Akathisia:  Negative  Handed:  Right  AIMS (if indicated):  Not indicated  Assets:  Communication Skills Desire for Improvement Financial Resources/Insurance Housing Social Support Talents/Skills Transportation Vocational/Educational  ADL's:  Intact  Cognition: WNL  Sleep:  Poor   Screenings: GAD-7    Flowsheet Row Routine Prenatal from 11/14/2019 in [facility] for [facility] Routine Prenatal from 11/07/2019 in Center for [facility]  Total GAD-7 Score 9 9      PHQ2-9    Flowsheet Row Video Visit from 02/04/2021 in [facility] Video Visit from 10/02/2020 in [facility] Counselor from 09/05/2020 in [facility] Routine Prenatal from 11/14/2019 in [facility] Routine Prenatal from 11/07/2019 in Center for [facility]  PHQ-2 Total Score 3 1 1 1 1   PHQ-9 Total  Score 10 -- -- 7 8      Flowsheet Row Video Visit from 02/04/2021 in [facility] Video Visit from 10/02/2020 in [facility]  C-SSRS RISK CATEGORY No Risk No Risk       Assessment and Plan: Patient reported feeling irritable and having crying spells lately.  She endorsed some depressive  symptoms.  Writer recommended adding sertraline to her regimen in conjunction with Abilify for optimal mood stabilization.  Patient recalled taking sertraline with fair results in the past and was willing to try it again. Potential side effects of medication and risks vs benefits of treatment vs non-treatment were explained and discussed. All questions were answered. Will continue the same dose of Adderall XR for now.  1. Bipolar 2 disorder, major depressive episode (HCC)  - Continue ARIPiprazole (ABILIFY) 5 MG tablet; Take 1 tablet (5 mg total) by mouth daily.  Dispense: 30 tablet; Refill: 2 - Start sertraline (ZOLOFT) 50 MG tablet; Take 1 tablet (50 mg total) by mouth daily with breakfast.  Dispense: 30 tablet; Refill: 2  2. Attention deficit hyperactivity disorder (ADHD), predominantly inattentive type  - amphetamine-dextroamphetamine (ADDERALL XR) 20 MG 24 hr capsule; Take 1 capsule (20 mg total) by mouth in the morning.  Dispense: 30 capsule; Refill: 0 - amphetamine-dextroamphetamine (ADDERALL XR) 20 MG 24 hr capsule; Take 1 capsule (20 mg total) by mouth in the morning.  Dispense: 30 capsule; Refill: 0 - amphetamine-dextroamphetamine (ADDERALL XR) 20 MG 24 hr capsule; Take 1 capsule (20 mg total) by mouth in the morning.  Dispense: 30 capsule; Refill: 0   Medications adjusted as above. Follow-up in 10 weeks.  Patient was informed that writer is leaving the office therefore her care is being transferred to a different provider in the clinic.  She verbalized her understanding and wished the writer the best.    Zena Amos, MD 6/28/20221:17  PM

## 2021-04-18 ENCOUNTER — Other Ambulatory Visit: Payer: Self-pay

## 2021-04-18 ENCOUNTER — Encounter (HOSPITAL_COMMUNITY): Payer: Self-pay | Admitting: Psychiatry

## 2021-04-18 ENCOUNTER — Telehealth (INDEPENDENT_AMBULATORY_CARE_PROVIDER_SITE_OTHER): Payer: Medicaid Other | Admitting: Psychiatry

## 2021-04-18 DIAGNOSIS — F3181 Bipolar II disorder: Secondary | ICD-10-CM | POA: Diagnosis not present

## 2021-04-18 DIAGNOSIS — F900 Attention-deficit hyperactivity disorder, predominantly inattentive type: Secondary | ICD-10-CM | POA: Diagnosis not present

## 2021-04-18 MED ORDER — SERTRALINE HCL 50 MG PO TABS: 50.0000 mg | ORAL_TABLET | Freq: Every day | ORAL | 3 refills | Status: DC

## 2021-04-18 MED ORDER — AMPHETAMINE-DEXTROAMPHET ER 30 MG PO CP24: 30.0000 mg | ORAL_CAPSULE | Freq: Every morning | ORAL | 0 refills | Status: DC

## 2021-04-18 MED ORDER — ARIPIPRAZOLE 5 MG PO TABS: 5.0000 mg | ORAL_TABLET | Freq: Every day | ORAL | 3 refills | Status: DC

## 2021-04-18 NOTE — Progress Notes (Signed)
BH MD/PA/NP OP Progress Note Virtual Visit via Video Note  I connected with Tanya Moore on 04/18/21 at 10:00 AM EDT by a video enabled telemedicine application and verified that I am speaking with the correct person using two identifiers.  Location: Patient: Home Provider: Clinic   I discussed the limitations of evaluation and management by telemedicine and the availability of in person appointments. The patient expressed understanding and agreed to proceed.  I provided 30 minutes of non-face-to-face time during this encounter.   04/18/2021 10:11 AM Tanya Moore  MRN:  [id number]  Chief Complaint: Things have been much better since starting the antidepressant but I still fell scatter brained".   HPI: 40 year old female seen today for follow up psychiatric evaluation. She is a former patient of Dr. M.Kaur who is being transferred to Clinical research associate for medication management. She has a psychiatric history of ADHD, Bipolar 2, anxiety, and depression. She is currently managed on Adderall XR 20 mg daily, Zoloft 50 mg daily, and Abilify 5 mg daily. She notes her medications are somewhat effective in managing her psychiatric conditions.  Today she is well groomed, pleasant, cooperative, engaged in conversation, and maintained eye contact. She informed Clinical research associate that things have been much better since starting Zoloft. She notes that she is more patient and has less crying spells. She notes that her depression has drastically improved but notes that she worries about her 32 month year old daughter and 33 year old son. She notes that her fears about her children are irrational at times. Today provider conducted a GAD 7 and patient scored a 16. Provider also conducted a PHQ 9 and patient scored a 6. She endorses adequate sleep and appetite. Patient notes that she is actively trying to lose weight as she still carries pregnancy weight. Today she denies SI/HI/VAH or paranoia. At times she notes that she is  distractible, irritable, and has racing thoughts but denies other symptoms of mania.  Patient note that her ADHD continues to be bothersome and describes herself as scatter brained. She noted that at her last Visit with Dr. Evelene Croon increasing her Adderall was discussed. Per chart review Dr. Evelene Croon recommended increasing Adderall once her depression was better managed. Today she is agreeable to increase Adderal XR 20 mg to 30 mg. She will continue all other medications as prescribed. No other concerns noted at this time.  Visit Diagnosis:    ICD-10-CM   1. Attention deficit hyperactivity disorder (ADHD), predominantly inattentive type  F90.0 amphetamine-dextroamphetamine (ADDERALL XR) 30 MG 24 hr capsule    2. Bipolar 2 disorder, major depressive episode (HCC)  F31.81 ARIPiprazole (ABILIFY) 5 MG tablet    sertraline (ZOLOFT) 50 MG tablet      Past Psychiatric History: History of anxiety, was diagnosed with bipolar disorder back in 2016.  Has been tried on several her medications.  Was being seen by PCP followed by [facility].  Stopped taking medications after she found out she is pregnant in 2020.  Past Medical History:  Past Medical History:  Diagnosis Date   Allergy    Anxiety    Bipolar 1 disorder (HCC)    Depression    Ectopic pregnancy with intrauterine pregnancy/Heterotopic Pregnancy 05/03/2019   S/p laparoscopic right salpingectomy on 05/03/19 05/03/19 SURGICAL PATHOLOGY FALLOPIAN TUBE WITH ECTOPIC PREGNANCY, RIGHT, SALPINGECTOMY:  - Benign fallopian tube with chorionic villi consistent with ectopic pregnancy.    Headache    Hemoperitoneum due to rupture of right tubal ectopic pregnancy 05/03/2019  History of ectopic pregnancy in 11/2018 11/20/2018   Mtx#1 given 4/3: Hcg day 0 (147), day 4 (180), day 7 (193) Mtx # 2 given 4/9 (193)->4/12 (153)->4/15 (79)   Hyperlipidemia    Infection    UTI   Pregnancy induced hypertension    Psoriasis 2020    Past Surgical History:  Procedure Laterality  Date   APPENDECTOMY     BUNIONECTOMY     CESAREAN SECTION  2017   CESAREAN SECTION N/A 11/27/2019   Procedure: CESAREAN SECTION AND IUD INSERTION;  Surgeon: Hermina Staggers, MD;  Location: [facility] LD ORS;  Service: Obstetrics;  Laterality: N/A;   DIAGNOSTIC LAPAROSCOPY WITH REMOVAL OF ECTOPIC PREGNANCY N/A 05/03/2019   Procedure: RIGHT SALPINGECTOMY AND REMOVAL OF ECTOPIC PREGNANCY;  Surgeon: Tereso Newcomer, MD;  Location: [facility] OR;  Service: Gynecology;  Laterality: N/A;   TONSILLECTOMY      Family Psychiatric History: Sister depression   Family History:  Family History  Problem Relation Age of Onset   Hypertension Mother    Hypertension Father    Cancer Father        prostate   Depression Sister     Social History:  Social History   Socioeconomic History   Marital status: Single    Spouse name: Not on file   Number of children: Not on file   Years of education: Not on file   Highest education level: Not on file  Occupational History   Not on file  Tobacco Use   Smoking status: Former    Types: Cigarettes    Quit date: 2017    Years since quitting: 5.6   Smokeless tobacco: Never  Vaping Use   Vaping Use: Never used  Substance and Sexual Activity   Alcohol use: No   Drug use: No   Sexual activity: Not Currently    Birth control/protection: None  Other Topics Concern   Not on file  Social History Narrative   Not on file   Social Determinants of Health   Financial Resource Strain: Not on file  Food Insecurity: Not on file  Transportation Needs: Not on file  Physical Activity: Not on file  Stress: Not on file  Social Connections: Not on file    Allergies:  Allergies  Allergen Reactions   Lamictal [Lamotrigine] Rash    Patient reported    Metabolic Disorder Labs: Lab Results  Component Value Date   HGBA1C 4.9 05/26/2019   No results found for: PROLACTIN Lab Results  Component Value Date   CHOL 305 (H) 06/05/2020   TRIG 94 06/05/2020   HDL 61  06/05/2020   CHOLHDL 5.0 (H) 06/05/2020   LDLCALC 228 (H) 06/05/2020   LDLCALC 163 (H) 02/14/2019   No results found for: TSH  Therapeutic Level Labs: No results found for: LITHIUM No results found for: VALPROATE No components found for:  CBMZ  Current Medications: Current Outpatient Medications  Medication Sig Dispense Refill   amphetamine-dextroamphetamine (ADDERALL XR) 30 MG 24 hr capsule Take 1 capsule (30 mg total) by mouth in the morning. 30 capsule 0   ARIPiprazole (ABILIFY) 5 MG tablet Take 1 tablet (5 mg total) by mouth daily. 30 tablet 3   Calcium Carb-Cholecalciferol (CALCIUM + D3) 600-200 MG-UNIT TABS Take 1 tablet by mouth daily.      sertraline (ZOLOFT) 50 MG tablet Take 1 tablet (50 mg total) by mouth daily with breakfast. 30 tablet 3   No current facility-administered medications for this visit.  Musculoskeletal: Strength & Muscle Tone:  Unable to assess due to telehealth visit Gait & Station:  Unable to assess due to telehealth visit Patient leans: N/A  Psychiatric Specialty Exam: Review of Systems  currently breastfeeding.There is no height or weight on file to calculate BMI.  General Appearance: Well Groomed  Eye Contact:  Good  Speech:  Clear and Coherent and Normal Rate  Volume:  Normal  Mood:  Euthymic and Notes feels anxious about children at times but reports she is able to cope with it.   Affect:  Appropriate and Congruent  Thought Process:  Coherent, Goal Directed, and Linear  Orientation:  Full (Time, Place, and Person)  Thought Content: WDL and Logical   Suicidal Thoughts:  No  Homicidal Thoughts:  No  Memory:  Immediate;   Good Recent;   Good Remote;   Good  Judgement:  Good  Insight:  Good  Psychomotor Activity:  Normal  Concentration:  Concentration: Fair and Attention Span: Fair  Recall:  Good  Fund of Knowledge: Good  Language: Good  Akathisia:  No  Handed:  Right  AIMS (if indicated): not done  Assets:  Communication  Skills Desire for Improvement Financial Resources/Insurance Housing Intimacy Leisure Time Physical Health Social Support  ADL's:  Intact  Cognition: WNL  Sleep:  Good   Screenings: GAD-7    Flowsheet Row Video Visit from 04/18/2021 in [facility] Routine Prenatal from 11/14/2019 in Center for [facility] Routine Prenatal from 11/07/2019 in Center for [facility]  Total GAD-7 Score 16 9 9       PHQ2-9    Flowsheet Row Video Visit from 04/18/2021 in [facility] Video Visit from 02/04/2021 in [facility] Video Visit from 10/02/2020 in [facility] Counselor from 09/05/2020 in [facility] Routine Prenatal from 11/14/2019 in [facility]  PHQ-2 Total Score 0 3 1 1 1   PHQ-9 Total Score 6 10 -- -- 7      Flowsheet Row Video Visit from 02/04/2021 in [facility] Video Visit from 10/02/2020 in [facility]  C-SSRS RISK CATEGORY No Risk No Risk        Assessment and Plan: Patient notes at times she is anxious about the well being of her children however notes that she is able to cope with it. She also notes that her depression has improved but notes her ADHD is bothersome. Today she is agreeable to increase Adderall XR 20 mg to 30 mg. She will continue all other medications as prescribed.  1. Attention deficit hyperactivity disorder (ADHD), predominantly inattentive type  Increased- amphetamine-dextroamphetamine (ADDERALL XR) 30 MG 24 hr capsule; Take 1 capsule (30 mg total) by mouth in the morning.  Dispense: 30 capsule; Refill: 0  2. Bipolar 2 disorder, major depressive episode (HCC)  Continue- ARIPiprazole (ABILIFY) 5 MG tablet; Take 1 tablet (5 mg total) by mouth daily.  Dispense: 30 tablet; Refill: 3 Continue- sertraline (ZOLOFT) 50  MG tablet; Take 1 tablet (50 mg total) by mouth daily with breakfast.  Dispense: 30 tablet; Refill: 3  Follow up in 3 months   10/04/2020, NP 04/18/2021, 10:11 AM

## 2021-04-29 ENCOUNTER — Other Ambulatory Visit: Payer: Self-pay

## 2021-04-29 ENCOUNTER — Ambulatory Visit (INDEPENDENT_AMBULATORY_CARE_PROVIDER_SITE_OTHER): Payer: Medicaid Other | Admitting: Licensed Clinical Social Worker

## 2021-04-29 DIAGNOSIS — F900 Attention-deficit hyperactivity disorder, predominantly inattentive type: Secondary | ICD-10-CM | POA: Diagnosis not present

## 2021-05-19 ENCOUNTER — Telehealth (HOSPITAL_COMMUNITY): Payer: Self-pay | Admitting: *Deleted

## 2021-05-19 ENCOUNTER — Other Ambulatory Visit (HOSPITAL_COMMUNITY): Payer: Self-pay | Admitting: Psychiatry

## 2021-05-19 DIAGNOSIS — F900 Attention-deficit hyperactivity disorder, predominantly inattentive type: Secondary | ICD-10-CM

## 2021-05-19 MED ORDER — AMPHETAMINE-DEXTROAMPHET ER 30 MG PO CP24: 30.0000 mg | ORAL_CAPSULE | Freq: Every morning | ORAL | 0 refills | Status: DC
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
## 2021-05-19 NOTE — Telephone Encounter (Signed)
PATIENT CALLED LVM REQUESTING REFILL ON STATED Rx HADN'T RECIVE NEW SCRIPT.  amphetamine-dextroamphetamine (ADDERALL XR) 30 MG 24 hr capsule

## 2021-05-19 NOTE — Telephone Encounter (Signed)
Medication refilled and sent to preferred pharmacy

## 2021-05-23 ENCOUNTER — Ambulatory Visit (HOSPITAL_COMMUNITY): Payer: Medicaid Other | Admitting: Licensed Clinical Social Worker

## 2021-06-13 ENCOUNTER — Telehealth (HOSPITAL_COMMUNITY): Payer: Self-pay | Admitting: *Deleted

## 2021-06-13 NOTE — Telephone Encounter (Signed)
Call from patient stating its nearly time for her Adderall to be called in. She has a day or two left. Will request Dr Doyne Keel to escribe new rx in for her next week.

## 2021-06-16 ENCOUNTER — Other Ambulatory Visit (HOSPITAL_COMMUNITY): Payer: Self-pay | Admitting: Psychiatry

## 2021-06-16 DIAGNOSIS — F900 Attention-deficit hyperactivity disorder, predominantly inattentive type: Secondary | ICD-10-CM

## 2021-06-16 MED ORDER — AMPHETAMINE-DEXTROAMPHET ER 30 MG PO CP24: 30.0000 mg | ORAL_CAPSULE | Freq: Every morning | ORAL | 0 refills | Status: DC

## 2021-06-16 NOTE — Telephone Encounter (Signed)
Medications refilled and sent to preferred pharmacy.

## 2021-07-15 ENCOUNTER — Telehealth (HOSPITAL_COMMUNITY): Payer: Self-pay

## 2021-07-15 ENCOUNTER — Other Ambulatory Visit (HOSPITAL_COMMUNITY): Payer: Self-pay | Admitting: Psychiatry

## 2021-07-15 DIAGNOSIS — F900 Attention-deficit hyperactivity disorder, predominantly inattentive type: Secondary | ICD-10-CM

## 2021-07-15 MED ORDER — AMPHETAMINE-DEXTROAMPHET ER 30 MG PO CP24: 30.0000 mg | ORAL_CAPSULE | Freq: Every morning | ORAL | 0 refills | Status: DC

## 2021-07-15 NOTE — Telephone Encounter (Signed)
Patient called requesting a refill on her Adderall XR 30mg . It was sent to on [address] in [place] last time. Followup appointment scheduled for 12/14. Please review and advise. Thank you

## 2021-07-15 NOTE — Telephone Encounter (Signed)
Medication refilled and sent to preferred pharmacy

## 2021-07-17 ENCOUNTER — Telehealth (HOSPITAL_COMMUNITY): Payer: Self-pay | Admitting: *Deleted

## 2021-07-17 NOTE — Telephone Encounter (Signed)
[employer] approved  ARIPiprazole (ABILIFY) 5 MG tablet  Effective:  12/8//2022  thru 12//03/2022  P.A. # [id number]

## 2021-07-18 ENCOUNTER — Ambulatory Visit (INDEPENDENT_AMBULATORY_CARE_PROVIDER_SITE_OTHER): Payer: Medicaid Other | Admitting: Licensed Clinical Social Worker

## 2021-07-18 DIAGNOSIS — F900 Attention-deficit hyperactivity disorder, predominantly inattentive type: Secondary | ICD-10-CM | POA: Diagnosis not present

## 2021-07-21 NOTE — Progress Notes (Signed)
THERAPIST PROGRESS NOTE   Virtual Visit via Video Note  I connected with Tanya Moore on 07/18/21 at  8:00 AM EST by a video enabled telemedicine application and verified that I am speaking with the correct person using two identifiers.  Location: Patient: Home Provider: Home   I discussed the limitations of evaluation and management by telemedicine and the availability of in person appointments. The patient expressed understanding and agreed to proceed. I discussed the assessment and treatment plan with the patient. The patient was provided an opportunity to ask questions and all were answered. The patient agreed with the plan and demonstrated an understanding of the instructions.   The patient was advised to call back or seek an in-person evaluation if the symptoms worsen or if the condition fails to improve as anticipated.  I provided 35 minutes of non-face-to-face time during this encounter.  Participation Level: Active  Behavioral Response: CasualAlertNegative and Dysphoric  Type of Therapy: Individual Therapy  Treatment Goals addressed: Communication: day to day life stressors, meds, coping  Interventions: Supportive and Other: additional assessment  Summary: Tanya Moore is a 40 y.o. female who presents with hx of ADHD.  Today patient logs on for video session per schedule.  This is the first session since initial session on September 20 as patient canceled last session.  Today patient has her 78-month-old child/dtr with her at home as her babysitter's family has the flu. 48 yo son in school.  LCSW advised of limitations to session and topics since her child is present.  Patient acknowledges the expectation going forward is that child will not be present for sessions.  LCSW assessed for any significant changes since initial session.  Patient reports ongoing challenges in her relationship with the man she is living with who is also the father of her youngest child.  Patient  reports the relationship is not healthy, there are intimacy issues and they we will be separating.  Patient reports that they are "cohabitating" for now until she can make an exit strategy.  Patient reports she is looking for work daily so she will have her own income and be able to move.  Patient reports there is no need to try couples counseling because she is "done".  Patient reports they will continue to coparent her daughter and that will be the extent of the relationship.  [name] reports that the father of her son is deceased from an overdose.  She reports her sister in [place] is supportive and aware of her current circumstances.  She states the relationship she has with her mother who is in [place] continues to be very strained.  She states her mother refuses to deal with any difficulty and pretends there is never any problems.  Coping addressed. Patient reports she is doing yoga 4 times a week which provides "brief relief".  She states her anxiety is "through the roof" and her whole body is "in a constant giant knot".  Patient reports she is taking meds as prescribed but feels they do not have her well managed.  She agrees to address the extent of her unmanaged symptoms with med provider at upcoming appointment.  LCSW provided referral to the [facility] resource center and provided information on [facility] for added support. LCSW reviewed poc including scheduling prior to close of session. Pt states appreciation for care.   Suicidal/Homicidal: Nowithout intent/plan  Therapist Response: Pt receptive to care.  Plan: Return again for next avail appt.  Diagnosis: Axis  I: ADHD, inattentive type  Tanya Sink, LCSW 07/21/2021

## 2021-07-21 NOTE — Telephone Encounter (Signed)
NOTIFIED PATIENT °

## 2021-07-21 NOTE — Telephone Encounter (Signed)
NOTIFIED PHARMACY AND PATIENT OF APPROVAL. MEDICATION CAN BE FILLED ON 12/31 PER PHARMACIST. NOTIFIED PATIENT OF THAT AS WELL. PT STATED SHE HAS ENOUGH UNTIL THEN

## 2021-07-23 ENCOUNTER — Telehealth (INDEPENDENT_AMBULATORY_CARE_PROVIDER_SITE_OTHER): Payer: Medicaid Other | Admitting: Psychiatry

## 2021-07-23 ENCOUNTER — Encounter (HOSPITAL_COMMUNITY): Payer: Self-pay | Admitting: Psychiatry

## 2021-07-23 DIAGNOSIS — F3181 Bipolar II disorder: Secondary | ICD-10-CM | POA: Diagnosis not present

## 2021-07-23 MED ORDER — SERTRALINE HCL 100 MG PO TABS: 100.0000 mg | ORAL_TABLET | Freq: Every day | ORAL | 3 refills | Status: DC

## 2021-07-23 MED ORDER — ARIPIPRAZOLE 10 MG PO TABS: 10.0000 mg | ORAL_TABLET | Freq: Every day | ORAL | 3 refills | Status: DC

## 2021-07-23 MED ORDER — ARIPIPRAZOLE 5 MG PO TABS: 5.0000 mg | ORAL_TABLET | Freq: Every day | ORAL | 3 refills | Status: DC

## 2021-07-23 NOTE — Progress Notes (Signed)
BH MD/PA/NP OP Progress Note Virtual Visit via Video Note  I connected with Tanya Moore on 07/23/21 at 11:00 AM EST by a video enabled telemedicine application and verified that I am speaking with the correct person using two identifiers.  Location: Patient: Home Provider: Clinic   I discussed the limitations of evaluation and management by telemedicine and the availability of in person appointments. The patient expressed understanding and agreed to proceed.  I provided 30 minutes of non-face-to-face time during this encounter.   07/23/2021 11:29 AM Tanya Moore  MRN:  [id number]  Chief Complaint: "My anxiety is through the roof".   HPI: 40 year old female seen today for follow up psychiatric evaluation. She has a psychiatric history of ADHD, Bipolar 2, anxiety, and depression. She is currently managed on Adderall XR 30 mg daily, Zoloft 50 mg daily, and Abilify 5 mg daily. She notes her medications are somewhat effective in managing her psychiatric conditions.  Today she is well groomed, pleasant, tearful, cooperative, engaged in conversation, and maintained eye contact. She informed Clinical research associate that her anxiety is through the roof.  She notes that she and her partner have decided to separate.  She notes that they however are still in the same home which she reports is difficult.  She informed Clinical research associate that he is emotionally reactive and notes that she feels he would benefit from therapy.  She informed Clinical research associate that she is worried about her children as well and explaining the separation.  Provider conducted a GAD-7 and patient scored an 18, at her last visit she scored a 16.  Provider also conducted a PHQ-9 and patient scored a 13, at her last visit she scored a 6.  Patient notes that recently her mood has been fluctuating.  She informed Clinical research associate that she has been more irritable, distractible, and having racing thoughts.  She denies SI/HI/VAH or paranoia.  Patient informed Clinical research associate that she sleeps  approximately 4 to 6 hours nightly however reports that this is her normal.  Today she is agreeable to increasing Abilify 5 mg to 10 mg to help manage mood.  She is also agreeable to increasing Zoloft 50 mg to 100 mg to help manage anxiety and depression.  Provider offered patient hydroxyzine however she notes that it was ineffective in the past.  She will continue her other medications as prescribed.  Adderall not refilled today as it was recently filled on 07/15/2021.  No other concerns at this time.   Visit Diagnosis:    ICD-10-CM   1. Bipolar 2 disorder, major depressive episode (HCC)  F31.81 sertraline (ZOLOFT) 100 MG tablet    ARIPiprazole (ABILIFY) 10 MG tablet    DISCONTINUED: ARIPiprazole (ABILIFY) 5 MG tablet      Past Psychiatric History: History of anxiety, was diagnosed with bipolar disorder back in 2016.  Has been tried on several her medications.  Was being seen by PCP followed by [facility].  Stopped taking medications after she found out she is pregnant in 2020.  Past Medical History:  Past Medical History:  Diagnosis Date   Allergy    Anxiety    Bipolar 1 disorder (HCC)    Depression    Ectopic pregnancy with intrauterine pregnancy/Heterotopic Pregnancy 05/03/2019   S/p laparoscopic right salpingectomy on 05/03/19 05/03/19 SURGICAL PATHOLOGY FALLOPIAN TUBE WITH ECTOPIC PREGNANCY, RIGHT, SALPINGECTOMY:  - Benign fallopian tube with chorionic villi consistent with ectopic pregnancy.    Headache    Hemoperitoneum due to rupture of right tubal ectopic pregnancy 05/03/2019  History of ectopic pregnancy in 11/2018 11/20/2018   Mtx#1 given 4/3: Hcg day 0 (147), day 4 (180), day 7 (193) Mtx # 2 given 4/9 (193)->4/12 (153)->4/15 (79)   Hyperlipidemia    Infection    UTI   Pregnancy induced hypertension    Psoriasis 2020    Past Surgical History:  Procedure Laterality Date   APPENDECTOMY     BUNIONECTOMY     CESAREAN SECTION  2017   CESAREAN SECTION N/A 11/27/2019   Procedure:  CESAREAN SECTION AND IUD INSERTION;  Surgeon: Hermina Staggers, MD;  Location: [facility] LD ORS;  Service: Obstetrics;  Laterality: N/A;   DIAGNOSTIC LAPAROSCOPY WITH REMOVAL OF ECTOPIC PREGNANCY N/A 05/03/2019   Procedure: RIGHT SALPINGECTOMY AND REMOVAL OF ECTOPIC PREGNANCY;  Surgeon: Tereso Newcomer, MD;  Location: [facility] OR;  Service: Gynecology;  Laterality: N/A;   TONSILLECTOMY      Family Psychiatric History: Sister depression   Family History:  Family History  Problem Relation Age of Onset   Hypertension Mother    Hypertension Father    Cancer Father        prostate   Depression Sister     Social History:  Social History   Socioeconomic History   Marital status: Single    Spouse name: Not on file   Number of children: Not on file   Years of education: Not on file   Highest education level: Not on file  Occupational History   Not on file  Tobacco Use   Smoking status: Former    Types: Cigarettes    Quit date: 2017    Years since quitting: 5.9   Smokeless tobacco: Never  Vaping Use   Vaping Use: Never used  Substance and Sexual Activity   Alcohol use: No   Drug use: No   Sexual activity: Not Currently    Birth control/protection: None  Other Topics Concern   Not on file  Social History Narrative   Not on file   Social Determinants of Health   Financial Resource Strain: Not on file  Food Insecurity: Not on file  Transportation Needs: Not on file  Physical Activity: Not on file  Stress: Not on file  Social Connections: Not on file    Allergies:  Allergies  Allergen Reactions   Lamictal [Lamotrigine] Rash    Patient reported    Metabolic Disorder Labs: Lab Results  Component Value Date   HGBA1C 4.9 05/26/2019   No results found for: PROLACTIN Lab Results  Component Value Date   CHOL 305 (H) 06/05/2020   TRIG 94 06/05/2020   HDL 61 06/05/2020   CHOLHDL 5.0 (H) 06/05/2020   LDLCALC 228 (H) 06/05/2020   LDLCALC 163 (H) 02/14/2019   No results found  for: TSH  Therapeutic Level Labs: No results found for: LITHIUM No results found for: VALPROATE No components found for:  CBMZ  Current Medications: Current Outpatient Medications  Medication Sig Dispense Refill   amphetamine-dextroamphetamine (ADDERALL XR) 30 MG 24 hr capsule Take 1 capsule (30 mg total) by mouth in the morning. 30 capsule 0   ARIPiprazole (ABILIFY) 10 MG tablet Take 1 tablet (10 mg total) by mouth daily. 30 tablet 3   Calcium Carb-Cholecalciferol (CALCIUM + D3) 600-200 MG-UNIT TABS Take 1 tablet by mouth daily.      sertraline (ZOLOFT) 100 MG tablet Take 1 tablet (100 mg total) by mouth daily with breakfast. 30 tablet 3   No current facility-administered medications for this visit.  Musculoskeletal: Strength & Muscle Tone:  Unable to assess due to telehealth visit Gait & Station:  Unable to assess due to telehealth visit Patient leans: N/A  Psychiatric Specialty Exam: Review of Systems  currently breastfeeding.There is no height or weight on file to calculate BMI.  General Appearance: Well Groomed  Eye Contact:  Good  Speech:  Clear and Coherent and Normal Rate  Volume:  Normal  Mood:  Anxious and Depressed  Affect:  Appropriate and Congruent  Thought Process:  Coherent, Goal Directed, and Linear  Orientation:  Full (Time, Place, and Person)  Thought Content: WDL and Logical   Suicidal Thoughts:  No  Homicidal Thoughts:  No  Memory:  Immediate;   Good Recent;   Good Remote;   Good  Judgement:  Good  Insight:  Good  Psychomotor Activity:  Normal  Concentration:  Concentration: Good and Attention Span: Good  Recall:  Good  Fund of Knowledge: Good  Language: Good  Akathisia:  No  Handed:  Right  AIMS (if indicated): not done  Assets:  Communication Skills Desire for Improvement Financial Resources/Insurance Housing Intimacy Leisure Time Physical Health Social Support  ADL's:  Intact  Cognition: WNL  Sleep:  Fair   Screenings: GAD-7     Flowsheet Row Video Visit from 07/23/2021 in [facility] Video Visit from 04/18/2021 in [facility] Routine Prenatal from 11/14/2019 in [facility] Routine Prenatal from 11/07/2019 in Center for [facility]  Total GAD-7 Score 18 16 9 9       PHQ2-9    Flowsheet Row Video Visit from 07/23/2021 in [facility] Counselor from 04/29/2021 in [facility] Video Visit from 04/18/2021 in [facility] Video Visit from 02/04/2021 in [facility] Video Visit from 10/02/2020 in [facility]  PHQ-2 Total Score 4 1 0 3 1  PHQ-9 Total Score 13 10 6 10  --      Flowsheet Row Counselor from 04/29/2021 in [facility] Video Visit from 02/04/2021 in [facility] Video Visit from 10/02/2020 in [facility]  C-SSRS RISK CATEGORY Low Risk No Risk No Risk        Assessment and Plan: Patient endorses symptoms of anxiety, depression, and hypomania.  Today she is agreeable to increasing Abilify 5 mg to 10 mg to help manage mood.  She is also agreeable to increasing Zoloft 50 mg to 100 mg to help manage anxiety and depression.  Provider offered patient hydroxyzine however she notes that it was ineffective in the past.  She will continue her other medications as prescribed.  Adderall not refilled today as it was recently filled on 07/15/2021.  1. Bipolar 2 disorder, major depressive episode (HCC)  Increased- sertraline (ZOLOFT) 100 MG tablet; Take 1 tablet (100 mg total) by mouth daily with breakfast.  Dispense: 30 tablet; Refill: 3 Increased- ARIPiprazole (ABILIFY) 10 MG tablet; Take 1 tablet (10 mg total) by mouth daily.  Dispense: 30 tablet; Refill: 3   Follow up in 3 months   [facility],  NP 07/23/2021, 11:29 AM

## 2021-08-11 NOTE — Progress Notes (Signed)
° ° °  SUBJECTIVE:   Chief compliant/HPI: annual examination  Tanya Moore is a 41 y.o. female who presents today for an annual exam.   Current concerns: none  History tabs reviewed and updated. PMH: bipolar disorder, ADHD, HLD   OBJECTIVE:   BP (!) 131/101    Pulse 96    Ht 5\' 4"  (1.626 m)    Wt 143 lb 4 oz (65 kg)    LMP 08/02/2021    SpO2 100%    BMI 24.59 kg/m   General: NAD, pleasant, able to participate in exam HEENT: normal sclera and conjunctiva, nares patent, oropharynx unremarkable, TMs normal bilaterally Cardiac: RRR, S1 S2 present. normal heart sounds, no murmurs. Respiratory: CTAB, normal effort, No wheezes, rales or rhonchi Abdomen: soft, nontender Extremities: no edema or cyanosis. Skin: warm and dry, no rashes noted Neuro: alert, no obvious focal deficits Psych: Normal affect and mood   ASSESSMENT/PLAN:   Hypercholesteremia Established problem since teenage years. Last lipid panel 06/05/2020 showed total cholesterol 305, triglycerides 94, HDL 61, and LDL 228. Patient is not currently on statin therapy. Has been on atorvastatin in the past without adverse effects, but states various providers have given her conflicting advice as to whether or not she needs it. No significant family hx of cardiovascular disease to her knowledge. -Obtain lipid panel today -If LDL >190, will initiate atorvastatin 20mg  daily and increase dose as needed/tolerated Patient agreeable with this plan -Consider calcium score vs referral to lipid clinic or cardiology in the future    Annual Examination  PHQ score 8, reviewed and discussed. Negative to question #9. Follows w/psychiatry and a therapist.  Blood pressure reviewed and was elevated today. No prior history of elevated BP aside from gestational HTN. Patient just had coffee prior to her appointment. Will monitor closely in the future. The patient currently uses condoms for contraception. Discussed alternative birth control forms  and patient declines.  Considered the following items based upon USPSTF recommendations: Diabetes screening:  not indicated Screening for elevated cholesterol: ordered, see above HIV testing: discussed and not indicated Hepatitis C:  discussed and not indicated Syphilis if at high risk:  not at high risk GC/CT not at high risk and not ordered.  Discussed family history, BRCA testing not indicated.  Cervical cancer screening: prior Pap reviewed, repeat due in October 2023 Breast cancer screening: discussed potential benefits, risks including overdiagnosis and biopsy, elected proceed with mammogram. Ordered previously but she needs to reschedule due to breastfeeding in the past 6 months. Will re-order today. Colorectal cancer screening: not applicable given age.  Vaccinations: flu shot and COVID booster given today.  Follow up in 1 year or sooner if indicated.    Alcus Dad, MD [place]

## 2021-08-12 ENCOUNTER — Other Ambulatory Visit: Payer: Self-pay

## 2021-08-12 ENCOUNTER — Encounter: Payer: Self-pay | Admitting: Family Medicine

## 2021-08-12 ENCOUNTER — Ambulatory Visit (INDEPENDENT_AMBULATORY_CARE_PROVIDER_SITE_OTHER): Payer: Medicaid Other

## 2021-08-12 ENCOUNTER — Ambulatory Visit (INDEPENDENT_AMBULATORY_CARE_PROVIDER_SITE_OTHER): Payer: Medicaid Other | Admitting: Family Medicine

## 2021-08-12 VITALS — BP 131/101 | HR 96 | Ht 64.0 in | Wt 143.2 lb

## 2021-08-12 DIAGNOSIS — E7800 Pure hypercholesterolemia, unspecified: Secondary | ICD-10-CM

## 2021-08-12 DIAGNOSIS — Z1231 Encounter for screening mammogram for malignant neoplasm of breast: Secondary | ICD-10-CM | POA: Diagnosis not present

## 2021-08-12 DIAGNOSIS — Z23 Encounter for immunization: Secondary | ICD-10-CM | POA: Diagnosis not present

## 2021-08-12 DIAGNOSIS — Z0000 Encounter for general adult medical examination without abnormal findings: Secondary | ICD-10-CM

## 2021-08-12 NOTE — Patient Instructions (Signed)
It was great to see you!  We are checking your cholesterol today. If your LDL is above 190, I would recommend restarting a statin (such as lipitor). Otherwise, we will continue to monitor over time. I will send you a MyChart message with the results in a few days.   We also did your flu and COVID vaccines today. You may notice arm soreness, fatigue, or low grade fever over the next 24-48 hours. This is normal and you can take Tylenol as needed.  Continue to exercise on a regular basis. The goal is 150 minutes of moderate intensity exercise per week. Limiting processed foods and incorporating 5 servings of fruits and vegetables daily in addition to regular exercise are some of the most important things you can do for your overall health.  Please schedule your mammogram sometime after April. You're due for a Pap smear in October 2023.  Take care and seek immediate care sooner if you develop any concerns.  Dr. [facility]

## 2021-08-12 NOTE — Assessment & Plan Note (Signed)
Established problem since teenage years. Last lipid panel 06/05/2020 showed total cholesterol 305, triglycerides 94, HDL 61, and LDL 228. Patient is not currently on statin therapy. Has been on atorvastatin in the past without adverse effects, but states various providers have given her conflicting advice as to whether or not she needs it. No significant family hx of cardiovascular disease to her knowledge. -Obtain lipid panel today -If LDL >190, will initiate atorvastatin 20mg  daily and increase dose as needed/tolerated Patient agreeable with this plan -Consider calcium score vs referral to lipid clinic or cardiology in the future

## 2021-08-13 LAB — LIPID PANEL
Chol/HDL Ratio: 5.6 ratio — ABNORMAL HIGH (ref 0.0–4.4)
Cholesterol, Total: 272 mg/dL — ABNORMAL HIGH (ref 100–199)
HDL: 49 mg/dL (ref >39)
LDL Chol Calc (NIH): 187 mg/dL — ABNORMAL HIGH (ref 0–99)
Triglycerides: 193 mg/dL — ABNORMAL HIGH (ref 0–149)
VLDL Cholesterol Cal: 36 mg/dL (ref 5–40)

## 2021-08-15 MED ORDER — ATORVASTATIN CALCIUM 20 MG PO TABS: 20.0000 mg | ORAL_TABLET | Freq: Every day | ORAL | 3 refills | Status: DC

## 2021-08-15 NOTE — Addendum Note (Signed)
Addended by: Maury Dus on: 08/15/2021 05:38 PM   Modules accepted: Orders

## 2021-08-19 ENCOUNTER — Telehealth (HOSPITAL_COMMUNITY): Payer: Self-pay | Admitting: *Deleted

## 2021-08-19 NOTE — Telephone Encounter (Signed)
Message left for writer asking for a new refill of her Adderall and to report she is not seeing any real imporivement with the recent increase of Abilify and Zoloft and would like the Dr to consider changes. Her future appt is 10/09/21. Will forward her concern to her provider who is [employer].

## 2021-08-20 ENCOUNTER — Other Ambulatory Visit (HOSPITAL_COMMUNITY): Payer: Self-pay | Admitting: Psychiatry

## 2021-08-20 DIAGNOSIS — F900 Attention-deficit hyperactivity disorder, predominantly inattentive type: Secondary | ICD-10-CM

## 2021-08-20 MED ORDER — AMPHETAMINE-DEXTROAMPHET ER 30 MG PO CP24: 30.0000 mg | ORAL_CAPSULE | Freq: Every morning | ORAL | 0 refills | Status: DC

## 2021-08-20 NOTE — Telephone Encounter (Signed)
Provider called patient who notes that she continues to be anxious and depressed.  She informed Clinical research associate that her stressors are mainly due to her living situation.  She and her partner continue to live together despite being separated.  She notes that she is looking for a job to get out of the stressful situation.  Provider informed patient that therapy may be more effective with handling the stressors instead of medication adjustments.  Patient has a therapy appointment on 08/21/2021.  Provider informed patient that if therapy is ineffective and she feels adjustment needs to be made to call the clinic for further instructions.  Provider discussed potentially starting BuSpar as an adjunct to help manage anxiety and depression.  Patient notes that she would consider it however informed writer that she has difficulties taking medications 3 times daily.  No other concerns at this time.

## 2021-08-21 ENCOUNTER — Ambulatory Visit (INDEPENDENT_AMBULATORY_CARE_PROVIDER_SITE_OTHER): Payer: Medicaid Other | Admitting: Licensed Clinical Social Worker

## 2021-08-21 DIAGNOSIS — F4321 Adjustment disorder with depressed mood: Secondary | ICD-10-CM

## 2021-08-21 DIAGNOSIS — F900 Attention-deficit hyperactivity disorder, predominantly inattentive type: Secondary | ICD-10-CM

## 2021-08-21 NOTE — Progress Notes (Signed)
° °  THERAPIST PROGRESS NOTE   Virtual Visit via Video Note  I connected with Tanya Moore on 08/21/21 at  8:00 AM EST by a video enabled telemedicine application and verified that I am speaking with the correct person using two identifiers.  Location: Patient: Home Provider: [facility]   I discussed the limitations of evaluation and management by telemedicine and the availability of in person appointments. The patient expressed understanding and agreed to proceed. I discussed the assessment and treatment plan with the patient. The patient was provided an opportunity to ask questions and all were answered. The patient agreed with the plan and demonstrated an understanding of the instructions.   The patient was advised to call back or seek an in-person evaluation if the symptoms worsen or if the condition fails to improve as anticipated.  I provided 16 minutes of non-face-to-face time during this encounter.  Participation Level: Minimal  Behavioral Response: CasualAlertNegative and Irritable  Type of Therapy: Individual Therapy  Treatment Goals addressed: Communication: dep/anx  Interventions: Other: additional assessment, solution focused  Summary: Tanya Moore is a 41 y.o. female who presents with hx of ADHD.  Today patient logs on for video session.  She is noted to be in an irritable mood with short answers, tense facial expression and in fact states "I have been in a terrible funk for at least a week".  LCSW assessed for status of patient's medications.  She reports for more than a month she has had an increase of Abilify and Zoloft yet states she cannot tell a difference at all.  She reports she had a phone conversation related to medication management yesterday.  She advises there were no changes made as Dr. Ronne Binning reportedly told Jakhya she felt she was under situational stress.  Patient then states she was offered BuSpar up to 3 times a day but patient declined this saying her  schedule is not conducive to taking medication 3 times a day.  LCSW assessed for changes in patient's situation.  Tanya Moore is continuing to cohabitate with the father of her youngest child.  She reports they do speak but he has an anger management problem, is defensive, and starts fights.  Her goal continues to be to move out soon as possible.  LCSW attempts to explore whether or not patient could go to her sisters in [place] but this was not well received. LCSW assessed for the status of patient's job search.  Patient states she is applying daily to multiple jobs.  She reports she does have an interview on Friday at [employer] but states this position would not be enough to live on.  When LCSW reframed Caliegh's thoughts regarding some income would be better than no income she becomes more irritated and states "I feel like you are patronizing me".  LCSW aplogizes and asked how patient would like to spend time in session today; she states "I don't know".  LCSW acknowledges there appears to be communication barriers in session today and asked patient if she would like to continue session.  Patient states she does not want to continue session and asked for another counselor which LCSW agreed to facilitate and did post session.  Suicidal/Homicidal:  UTA  Therapist Response: Pt not receptive to care.  Plan: Requests another counselor  Diagnosis: Axis I: Adjustment Disorder with Depressed Mood and ADHD, inattentive type  Hermine Messick, LCSW 08/21/2021

## 2021-08-26 ENCOUNTER — Telehealth (HOSPITAL_COMMUNITY): Payer: Self-pay

## 2021-08-26 NOTE — Telephone Encounter (Signed)
Writer disregarded form after speaking with pharmacist and did not fill it out

## 2021-08-26 NOTE — Telephone Encounter (Signed)
Writer received a PA  for patient's Adderall XR 30mg . The PA form was for the generic (amphetamine-dextroamphetamine). Writer called and spoke with the pharmacist. Patient's medication was sent in on 08/20/21 #30 with a notation for the pharmacy to fill Brand Name Adderall XR. Pharmacist stated that the brand name Adderall XR went through without a PA and the patient picked her medication up on 08/23/21

## 2021-09-23 ENCOUNTER — Telehealth (HOSPITAL_COMMUNITY): Payer: Self-pay | Admitting: *Deleted

## 2021-09-23 NOTE — Telephone Encounter (Signed)
VM left for writer requesting a new rx for her Adderall. Called her back and left her a VM to make sure she knew her pharmacy had Adderall and if not find one that does and we will send it to the pharmacy with a supply and she is to call me back and I will notify Dr Doyne Keel to [place] it in when I know where its going.

## 2021-09-23 NOTE — Telephone Encounter (Signed)
Provider will fill medication upon patient's follow-up call.

## 2021-09-23 NOTE — Telephone Encounter (Signed)
Opened a second time in error. 

## 2021-10-02 ENCOUNTER — Ambulatory Visit (HOSPITAL_COMMUNITY): Payer: Medicaid Other | Admitting: Licensed Clinical Social Worker

## 2021-10-09 ENCOUNTER — Telehealth (INDEPENDENT_AMBULATORY_CARE_PROVIDER_SITE_OTHER): Payer: Medicaid Other | Admitting: Psychiatry

## 2021-10-09 ENCOUNTER — Ambulatory Visit (HOSPITAL_COMMUNITY): Payer: Medicaid Other | Admitting: Licensed Clinical Social Worker

## 2021-10-09 DIAGNOSIS — F3181 Bipolar II disorder: Secondary | ICD-10-CM

## 2021-10-09 DIAGNOSIS — F900 Attention-deficit hyperactivity disorder, predominantly inattentive type: Secondary | ICD-10-CM

## 2021-10-09 MED ORDER — ARIPIPRAZOLE 10 MG PO TABS: 10.0000 mg | ORAL_TABLET | Freq: Every day | ORAL | 3 refills | Status: DC

## 2021-10-09 MED ORDER — SERTRALINE HCL 100 MG PO TABS: 100.0000 mg | ORAL_TABLET | Freq: Every day | ORAL | 3 refills | Status: DC

## 2021-10-09 NOTE — Progress Notes (Signed)
BH MD/PA/NP OP Progress Note  10/09/2021 9:06 AM Tanya Moore  MRN:  [id number]  Virtual Visit via Video Note  I connected with Tanya Moore on 10/09/21 at  8:30 AM EST by a video enabled telemedicine application and verified that I am speaking with the correct person using two identifiers.  Location: Patient: home Provider: offsite   I discussed the limitations of evaluation and management by telemedicine and the availability of in person appointments. The patient expressed understanding and agreed to proceed.    I discussed the assessment and treatment plan with the patient. The patient was provided an opportunity to ask questions and all were answered. The patient agreed with the plan and demonstrated an understanding of the instructions.   The patient was advised to call back or seek an in-person evaluation if the symptoms worsen or if the condition fails to improve as anticipated.  I provided 10 minutes of non-face-to-face time during this encounter.   Tanya Sober, NP   Chief Complaint: "I have been out of Adderall for two weeks"  HPI: Tanya Moore is a 41 year old female presenting to [facility] behavioral health outpatient for follow-up psychiatric evaluation.  She has a past psychiatric history of ADHD, bipolar disorder, anxiety and depression.  Her symptoms are managed with Adderall XR 30 mg every morning, Abilify 10 mg daily, and sertraline 100 mg daily.  She reports medications are effective but states she has not received her Adderall from the pharmacy in 2 weeks.  Patient reports being "scatterbrained" today. Patient reports that she does not work or attend school, but takes care of her two children at home. She reports a good appetite and sleep cycle, getting adequate rest nightly.  Patient is alert and oriented x4, pleasant and willing to engage.  She is appropriately dressed for the weather and appears well-groomed.  She denies auditory or visual hallucinations,  paranoia, delusional thought, or suicidal or homicidal ideations.  Visit Diagnosis:    ICD-10-CM   1. Bipolar 2 disorder, major depressive episode (HCC)  F31.81     2. Attention deficit hyperactivity disorder (ADHD), predominantly inattentive type  F90.0       Past Psychiatric History: ADHD, bipolar disorder, anxiety and depression.  Past Medical History:  Past Medical History:  Diagnosis Date   ADHD    Allergy    Anxiety    Bipolar 1 disorder (HCC)    Depression    Ectopic pregnancy with intrauterine pregnancy/Heterotopic Pregnancy 05/03/2019   S/p laparoscopic right salpingectomy on 05/03/19 05/03/19 SURGICAL PATHOLOGY FALLOPIAN TUBE WITH ECTOPIC PREGNANCY, RIGHT, SALPINGECTOMY:  - Benign fallopian tube with chorionic villi consistent with ectopic pregnancy.    Headache    Hemoperitoneum due to rupture of right tubal ectopic pregnancy 05/03/2019   History of ectopic pregnancy in 11/2018 11/20/2018   Mtx#1 given 4/3: Hcg day 0 (147), day 4 (180), day 7 (193) Mtx # 2 given 4/9 (193)->4/12 (153)->4/15 (79)   Hyperlipidemia    Infection    UTI   Pregnancy induced hypertension    Psoriasis 2020    Past Surgical History:  Procedure Laterality Date   APPENDECTOMY     BUNIONECTOMY     CESAREAN SECTION  2017   CESAREAN SECTION N/A 11/27/2019   Procedure: CESAREAN SECTION AND IUD INSERTION;  Surgeon: Hermina Staggers, MD;  Location: [facility] LD ORS;  Service: Obstetrics;  Laterality: N/A;   DIAGNOSTIC LAPAROSCOPY WITH REMOVAL OF ECTOPIC PREGNANCY N/A 05/03/2019   Procedure: RIGHT SALPINGECTOMY AND REMOVAL  OF ECTOPIC PREGNANCY;  Surgeon: Tereso Newcomer, MD;  Location: [facility] OR;  Service: Gynecology;  Laterality: N/A;   TONSILLECTOMY      Family Psychiatric History: see below  Family History:  Family History  Problem Relation Age of Onset   Hypertension Mother    Hypertension Father    Cancer Father        prostate   Depression Sister     Social History:  Social History    Socioeconomic History   Marital status: Single    Spouse name: Not on file   Number of children: Not on file   Years of education: Not on file   Highest education level: Not on file  Occupational History   Not on file  Tobacco Use   Smoking status: Former    Types: Cigarettes    Quit date: 2017    Years since quitting: 6.1   Smokeless tobacco: Never  Vaping Use   Vaping Use: Never used  Substance and Sexual Activity   Alcohol use: No   Drug use: No   Sexual activity: Not Currently    Birth control/protection: None  Other Topics Concern   Not on file  Social History Narrative   Not on file   Social Determinants of Health   Financial Resource Strain: Not on file  Food Insecurity: Not on file  Transportation Needs: Not on file  Physical Activity: Not on file  Stress: Not on file  Social Connections: Not on file    Allergies:  Allergies  Allergen Reactions   Lamictal [Lamotrigine] Rash    Patient reported    Metabolic Disorder Labs: Lab Results  Component Value Date   HGBA1C 4.9 05/26/2019   No results found for: PROLACTIN Lab Results  Component Value Date   CHOL 272 (H) 08/12/2021   TRIG 193 (H) 08/12/2021   HDL 49 08/12/2021   CHOLHDL 5.6 (H) 08/12/2021   LDLCALC 187 (H) 08/12/2021   LDLCALC 228 (H) 06/05/2020   No results found for: TSH  Therapeutic Level Labs: No results found for: LITHIUM No results found for: VALPROATE No components found for:  CBMZ  Current Medications: Current Outpatient Medications  Medication Sig Dispense Refill   amphetamine-dextroamphetamine (ADDERALL XR) 30 MG 24 hr capsule Take 1 capsule (30 mg total) by mouth in the morning. Please fill brand name Adderall capsules 30 mg for insurance Prior Authorization 30 capsule 0   ARIPiprazole (ABILIFY) 10 MG tablet Take 1 tablet (10 mg total) by mouth daily. 30 tablet 3   atorvastatin (LIPITOR) 20 MG tablet Take 1 tablet (20 mg total) by mouth daily. 90 tablet 3   Calcium  Carb-Cholecalciferol (CALCIUM + D3) 600-200 MG-UNIT TABS Take 1 tablet by mouth daily.      sertraline (ZOLOFT) 100 MG tablet Take 1 tablet (100 mg total) by mouth daily with breakfast. 30 tablet 3   No current facility-administered medications for this visit.     Musculoskeletal: Strength & Muscle Tone:  N/A Gait & Station:  N/A Patient leans: N/A  Psychiatric Specialty Exam: Review of Systems  Psychiatric/Behavioral:  Positive for decreased concentration. Negative for hallucinations, self-injury and suicidal ideas.   All other systems reviewed and are negative.  currently breastfeeding.There is no height or weight on file to calculate BMI.  General Appearance: Well Groomed  Eye Contact:  Good  Speech:  Clear and Coherent  Volume:  Normal  Mood:  Euthymic  Affect:  Congruent  Thought Process:  Goal Directed  Orientation:  Full (Time, Place, and Person)  Thought Content: Logical   Suicidal Thoughts:  No  Homicidal Thoughts:  No  Memory:  Immediate;   Good Recent;   Good Remote;   Good  Judgement:  Good  Insight:  Good  Psychomotor Activity:  NA  Concentration:  Concentration: Good and Attention Span: Good  Recall:  Good  Fund of Knowledge: Good  Language: Good  Akathisia:  NA  Handed:  Right  AIMS (if indicated): not done  Assets:  Communication Skills Desire for Improvement  ADL's:  Intact  Cognition: WNL  Sleep:  Good   Screenings: GAD-7    Flowsheet Row Video Visit from 07/23/2021 in [facility] Video Visit from 04/18/2021 in [facility] Routine Prenatal from 11/14/2019 in [facility] Routine Prenatal from 11/07/2019 in [facility]  Total GAD-7 Score 18 16 9 9       PHQ2-9    Flowsheet Row Office Visit from 08/12/2021 in [facility] Video Visit from 07/23/2021 in [facility] Counselor from 04/29/2021  in [facility] Video Visit from 04/18/2021 in [facility] Video Visit from 02/04/2021 in [facility]  PHQ-2 Total Score 2 4 1  0 3  PHQ-9 Total Score 8 13 10 6 10       Flowsheet Row Counselor from 04/29/2021 in [facility] Video Visit from 02/04/2021 in [facility] Video Visit from 10/02/2020 in [facility]  C-SSRS RISK CATEGORY Low Risk No Risk No Risk        Assessment and Plan: Kasaundra Fahrney is a 41 year old female presenting to [facility] behavioral health outpatient for follow-up psychiatric evaluation.  She has a past psychiatric history of ADHD, bipolar disorder, anxiety and depression.  Her symptoms are managed with Adderall XR 30 mg every morning, Abilify 10 mg daily, and sertraline 100 mg daily.  She reports medications are effective but states she has not received her Adderall from the pharmacy in 2 weeks.  Adderall can be discontinued at this time due to minimal adverse effects while not taking the medication, as well as patient is not in school nor does she work.  Abilify 10 mg and sertraline 100 mg refilled and patient is continue her current dosages.  Patient is recommended for individual psychotherapy to manage symptoms of ADHD.  Collaboration of Care: Collaboration of Care: Medication Management AEB medication E scribed to patient's preferred pharmacy.  1. Bipolar 2 disorder, major depressive episode (HCC)  - ARIPiprazole (ABILIFY) 10 MG tablet; Take 1 tablet (10 mg total) by mouth daily.  Dispense: 30 tablet; Refill: 3 - sertraline (ZOLOFT) 100 MG tablet; Take 1 tablet (100 mg total) by mouth daily with breakfast.  Dispense: 30 tablet; Refill: 3  2. Attention deficit hyperactivity disorder (ADHD), predominantly inattentive type  Individual therapy recommended for symptom management  Return to care in 3  months Follow-up will recommend individual therapy.  Patient/Guardian was advised Release of Information must be obtained prior to any record release in order to collaborate their care with an outside provider. Patient/Guardian was advised if they have not already done so to contact the registration department to sign all necessary forms in order for Elinor Parkinson to release information regarding their care.   Consent: Patient/Guardian gives verbal consent for treatment and assignment of benefits for services provided during this  visit. Patient/Guardian expressed understanding and agreed to proceed.    Tanya Sobericely Jw Covin, NP 10/09/2021, 9:06 AM

## 2021-10-10 ENCOUNTER — Telehealth (HOSPITAL_COMMUNITY): Payer: Self-pay | Admitting: Psychiatry

## 2021-10-10 NOTE — Telephone Encounter (Signed)
Writer contacted patient per provider for referral for individual psychotherapy for ADHD symptoms. Patient gave verbal consent to have demographic and insurance information to [facility] for referral. Referral was submitted online on 10/09/2021.  ?

## 2021-10-21 ENCOUNTER — Inpatient Hospital Stay (HOSPITAL_COMMUNITY): Payer: Medicaid Other

## 2021-10-21 ENCOUNTER — Other Ambulatory Visit: Payer: Self-pay

## 2021-10-21 ENCOUNTER — Encounter (HOSPITAL_COMMUNITY): Payer: Self-pay | Admitting: Obstetrics and Gynecology

## 2021-10-21 ENCOUNTER — Inpatient Hospital Stay (HOSPITAL_COMMUNITY)
Admission: AD | Admit: 2021-10-21 | Discharge: 2021-10-21 | Disposition: A | Discharge status: Home / Self Care | Payer: Medicaid Other | Attending: Obstetrics and Gynecology | Admitting: Obstetrics and Gynecology

## 2021-10-21 DIAGNOSIS — Z332 Encounter for elective termination of pregnancy: Secondary | ICD-10-CM | POA: Insufficient documentation

## 2021-10-21 DIAGNOSIS — Z3491 Encounter for supervision of normal pregnancy, unspecified, first trimester: Secondary | ICD-10-CM

## 2021-10-21 DIAGNOSIS — Z3A00 Weeks of gestation of pregnancy not specified: Secondary | ICD-10-CM | POA: Diagnosis not present

## 2021-10-21 DIAGNOSIS — Z3A01 Less than 8 weeks gestation of pregnancy: Secondary | ICD-10-CM | POA: Insufficient documentation

## 2021-10-21 DIAGNOSIS — O09521 Supervision of elderly multigravida, first trimester: Secondary | ICD-10-CM | POA: Insufficient documentation

## 2021-10-21 DIAGNOSIS — N83202 Unspecified ovarian cyst, left side: Secondary | ICD-10-CM | POA: Diagnosis not present

## 2021-10-21 DIAGNOSIS — O26891 Other specified pregnancy related conditions, first trimester: Secondary | ICD-10-CM | POA: Insufficient documentation

## 2021-10-21 DIAGNOSIS — R1032 Left lower quadrant pain: Secondary | ICD-10-CM

## 2021-10-21 DIAGNOSIS — R109 Unspecified abdominal pain: Secondary | ICD-10-CM

## 2021-10-21 DIAGNOSIS — O09291 Supervision of pregnancy with other poor reproductive or obstetric history, first trimester: Secondary | ICD-10-CM | POA: Insufficient documentation

## 2021-10-21 DIAGNOSIS — O3680X0 Pregnancy with inconclusive fetal viability, not applicable or unspecified: Secondary | ICD-10-CM

## 2021-10-21 LAB — CBC
HCT: 39.4 % (ref 36.0–46.0)
Hemoglobin: 13.4 g/dL (ref 12.0–15.0)
MCH: 29.9 pg (ref 26.0–34.0)
MCHC: 34 g/dL (ref 30.0–36.0)
MCV: 87.9 fL (ref 80.0–100.0)
Platelets: 391 10*3/uL (ref 150–400)
RBC: 4.48 MIL/uL (ref 3.87–5.11)
RDW: 12.7 % (ref 11.5–15.5)
WBC: 11.5 10*3/uL — ABNORMAL HIGH (ref 4.0–10.5)
nRBC: 0 % (ref 0.0–0.2)

## 2021-10-21 LAB — POCT PREGNANCY, URINE: Preg Test, Ur: POSITIVE — AB

## 2021-10-21 LAB — URINALYSIS, ROUTINE W REFLEX MICROSCOPIC
Bilirubin Urine: NEGATIVE
Glucose, UA: NEGATIVE mg/dL
Hgb urine dipstick: NEGATIVE
Ketones, ur: NEGATIVE mg/dL
Leukocytes,Ua: NEGATIVE
Nitrite: NEGATIVE
Protein, ur: NEGATIVE mg/dL
Specific Gravity, Urine: 1.014 (ref 1.005–1.030)
pH: 6 (ref 5.0–8.0)

## 2021-10-21 LAB — HCG, QUANTITATIVE, PREGNANCY: hCG, Beta Chain, Quant, S: 87 m[IU]/mL — ABNORMAL HIGH (ref <5)

## 2021-10-21 MED ORDER — ACETAMINOPHEN 325 MG PO TABS: 650.0000 mg | ORAL_TABLET | Freq: Four times a day (QID) | ORAL | 0 refills | Status: DC | PRN

## 2021-10-21 MED ORDER — ACETAMINOPHEN 500 MG PO TABS
1000.0000 mg | ORAL_TABLET | Freq: Once | ORAL | Status: AC
  Administered 2021-10-21: 1000 mg via ORAL
  Filled 2021-10-21: qty 2

## 2021-10-21 NOTE — MAU Note (Signed)
...  Tanya Moore is a 41 y.o. at Unknown here in MAU reporting: bilateral lower adominal pain that began yesterday. She states her pain is mostly on her left side but will occasionally occur on her right side. Denies VB or abnormal discharge. Last IC two days ago. Has a hx of an ectopic pregnancy. Unsure of which side. ? ?LMP: 09/01/2021 ? ?Pain score:  ?4/10 lower abdomen ? ?Lab orders placed from triage:  POCT Pregnancy ? ?

## 2021-10-21 NOTE — MAU Provider Note (Signed)
?History  ?  ? ?CSN: [id number] ? ?Arrival date and time: 10/21/21 1237 ? ? None  ?  ? ?Chief Complaint  ?Patient presents with  ? Abdominal Pain  ? ?HPI ?41 yo [id number]  at ~[redacted]w[redacted]d by LMP with hx of one prior ectopic pregnancy and once prior heterotopic  pregnancy who presents with left lower abdominal pain and cramping.  ? ? ? ?#Lower abdominal cramping ?Started yesterday ?4/10 cramping ?Lower belly worse on right side ?Had prior ectopic pregnancy (03/2019) ?Also had CS in 2021 ?Spotting three days ago - one time with wiping after urination ?Not taken anything for pain ?Denies nausea, vomiting, lightheadedness, and dizziness ?Last ate ~11AM on 3/14 ?Patient does not desire to continue pregnancy and plans to terminate and has appointment to terminate later this week ? ? ?Per chart review:  ?- Has history of prior ruptured ectopic pregnancy with laparoscopic right salpingectomy (04/2019) ?- 11/2018: ectopic pregnancy treated with MTX x2 with confirmed downtrend of Hcg ? ? ? ? ? ?OB History   ? ? Gravida  ?6  ? Para  ?2  ? Term  ?2  ? Preterm  ?   ? AB  ?3  ? Living  ?2  ?  ? ? SAB  ?   ? IAB  ?1  ? Ectopic  ?2  ? Multiple  ?0  ? Live Births  ?2  ?   ?  ?  ? ? ?Past Medical History:  ?Diagnosis Date  ? ADHD   ? Allergy   ? Anxiety   ? Bipolar 1 disorder (HCC)   ? Depression   ? Ectopic pregnancy with intrauterine pregnancy/Heterotopic Pregnancy 05/03/2019  ? S/p laparoscopic right salpingectomy on 05/03/19 05/03/19 SURGICAL PATHOLOGY FALLOPIAN TUBE WITH ECTOPIC PREGNANCY, RIGHT, SALPINGECTOMY:  - Benign fallopian tube with chorionic villi consistent with ectopic pregnancy.   ? Headache   ? Hemoperitoneum due to rupture of right tubal ectopic pregnancy 05/03/2019  ? History of ectopic pregnancy in 11/2018 11/20/2018  ? Mtx#1 given 4/3: Hcg day 0 (147), day 4 (180), day 7 (193) Mtx # 2 given 4/9 (193)->4/12 (153)->4/15 (79)  ? Hyperlipidemia   ? Infection   ? UTI  ? Pregnancy induced hypertension   ? Psoriasis 2020  ? ? ?Past  Surgical History:  ?Procedure Laterality Date  ? APPENDECTOMY    ? BUNIONECTOMY    ? CESAREAN SECTION  2017  ? CESAREAN SECTION N/A 11/27/2019  ? Procedure: CESAREAN SECTION AND IUD INSERTION;  Surgeon: Hermina Staggers, MD;  [facility]: [facility];  Service: Obstetrics;  Laterality: N/A;  ? DIAGNOSTIC LAPAROSCOPY WITH REMOVAL OF ECTOPIC PREGNANCY N/A 05/03/2019  ? Procedure: RIGHT SALPINGECTOMY AND REMOVAL OF ECTOPIC PREGNANCY;  Surgeon: Tereso Newcomer, MD;  Location: [facility] OR;  Service: Gynecology;  Laterality: N/A;  ? TONSILLECTOMY    ? ? ?Family History  ?Problem Relation Age of Onset  ? Hypertension Mother   ? Hypertension Father   ? Cancer Father   ?     prostate  ? Depression Sister   ? ? ?Social History  ? ?Tobacco Use  ? Smoking status: Former  ?  Types: Cigarettes  ?  Quit date: 2017  ?  Years since quitting: 6.2  ? Smokeless tobacco: Never  ?Vaping Use  ? Vaping Use: Never used  ?Substance Use Topics  ? Alcohol use: No  ? Drug use: No  ? ? ?Allergies:  ?Allergies  ?Allergen Reactions  ? Lamictal [Lamotrigine] Rash  ?  Patient reported  ? ? ?Medications Prior to Admission  ?Medication Sig Dispense Refill Last Dose  ? amphetamine-dextroamphetamine (ADDERALL XR) 30 MG 24 hr capsule Take 1 capsule (30 mg total) by mouth in the morning. Please fill brand name Adderall capsules 30 mg for insurance Prior Authorization 30 capsule 0   ? ARIPiprazole (ABILIFY) 10 MG tablet Take 1 tablet (10 mg total) by mouth daily. 30 tablet 3   ? atorvastatin (LIPITOR) 20 MG tablet Take 1 tablet (20 mg total) by mouth daily. 90 tablet 3   ? Calcium Carb-Cholecalciferol (CALCIUM + D3) 600-200 MG-UNIT TABS Take 1 tablet by mouth daily.      ? sertraline (ZOLOFT) 100 MG tablet Take 1 tablet (100 mg total) by mouth daily with breakfast. 30 tablet 3   ? ? ?Review of Systems  ?Constitutional:  Negative for chills and fever.  ?HENT:  Negative for congestion.   ?Respiratory:  Negative for shortness of breath.   ?Gastrointestinal:  Positive  for abdominal pain. Negative for diarrhea, nausea and vomiting.  ?Endocrine: Negative for polyuria.  ?Genitourinary:  Negative for dysuria.  ?Musculoskeletal:  Negative for back pain.  ?Skin:  Negative for rash.  ?Neurological:  Negative for dizziness, syncope, weakness, light-headedness and headaches.  ?Psychiatric/Behavioral:  Negative for agitation.   ?Physical Exam  ? ?Blood pressure 122/79, temperature 98.4 ?F (36.9 ?C), temperature source Oral, resp. rate 15, height 5\' 3"  (1.6 m), weight 64.2 kg, last menstrual period 08/02/2021, SpO2 100 %, currently breastfeeding. ? ?Physical Exam ?Vitals and nursing note reviewed.  ?Constitutional:   ?   General: She is not in acute distress. ?   Appearance: She is well-developed. She is not ill-appearing.  ?HENT:  ?   Head: Normocephalic.  ?Cardiovascular:  ?   Rate and Rhythm: Normal rate.  ?Pulmonary:  ?   Effort: No respiratory distress.  ?Abdominal:  ?   General: Abdomen is flat.  ?   Palpations: There is no mass.  ?   Tenderness: There is abdominal tenderness.  ?   Comments: Mild TTP of left lower quadrant  ?Skin: ?   General: Skin is warm.  ?   Capillary Refill: Capillary refill takes less than 2 seconds.  ?Neurological:  ?   General: No focal deficit present.  ?   Mental Status: She is alert and oriented to person, place, and time.  ?Psychiatric:     ?   Mood and Affect: Mood normal.  ? ? ?MAU Course  ?Procedures ?08/04/2021 3/14 ?IMPRESSION: ?1. No evidence of intrauterine pregnancy at this time. Serial beta ?HCG measurements and follow-up ultrasound recommended to document ?intrauterine pregnancy and exclude ectopic pregnancy. ?2. Probable corpus luteum cyst left ovary. This could be reassessed ?at follow-up. ? ? ?MDM ?41 yo 46  at ~[redacted]w[redacted]d by LMP with hx of two prior ectopic pregnancies (one of which was in setting of heterotopic pregnancy and ended in laparoscopic right salpingectomy for ruptured ectopic) who presents with left lower quadrant abdominal pain.   ? ?Assessment and Plan  ?Left lower abdominal pain in pregnancy of unknown location ?Patient with hx of two prior ectopic pregnancies and s/p right salpingectomy (2020). Presents with lower abdominal pain at [redacted]wk gestation (by LMP). Hemodynamically stable. No vaginal bleeding ?- CBC ?- ABO/RH type ?- HCG  ?- 03-13-1984 to evaluate for pregnancy location ? ?4:51 PM ?[place] with pregnancy of unknown location. HCG low at 87 likely pregnancy is earlier than LMP indicates. DDX includes early pregnancy that is too early  to see on Ultrasound vs. Ectopic. Will plan for serial HCG in 48 hours in office for trending and further management. Discussed ectopic precautions in detail and patient expressed understanding. [facility]cg [facility]. ? ?2. Desires pregnancy termination ?Patient desires to terminate pregnancy. Has appointment already coming up this week. However given pregnancy of unknown location discussed with patient to reschedule to later date while work up being done. Patient expressed understanding and agreement. ? ?Warner MccreedyAnuka Kwadwo Taras, MD, MPH ?OB Fellow, Faculty Practice ? ?

## 2021-10-21 NOTE — Discharge Instructions (Signed)
You came to the MAU because you had abdominal pain in early pregnancy. We did an ultrasound which did not show a pregnancy in the uterus which could be either because it is too early to see the pregnancy on the ultrasound or it could be because the pregnancy is outside of the uterus. We checked your pregnancy hormone level (hcg) and you will need another one in two days in the office to see if the hormone level is going up appropriately. We have made you an appointment at the lab at [facility] ([address] in [place]) to have your lab checked and for further management. ? ?Please seek immediate medical care if your pain gets much worse suddenly, you have heavy bleeding, or you feel extremely lightheaded or dizzy. ?

## 2021-10-22 LAB — ABO/RH: ABO/RH(D): O POS

## 2021-10-23 ENCOUNTER — Other Ambulatory Visit: Payer: Self-pay

## 2021-10-23 ENCOUNTER — Other Ambulatory Visit (INDEPENDENT_AMBULATORY_CARE_PROVIDER_SITE_OTHER): Payer: Medicaid Other

## 2021-10-23 VITALS — BP 125/83 | HR 91 | Wt 141.9 lb

## 2021-10-23 DIAGNOSIS — O3680X0 Pregnancy with inconclusive fetal viability, not applicable or unspecified: Secondary | ICD-10-CM | POA: Diagnosis not present

## 2021-10-23 LAB — BETA HCG QUANT (REF LAB): hCG Quant: 207 m[IU]/mL

## 2021-10-23 NOTE — Progress Notes (Signed)
Beta HCG Follow-up Visit ? ?Tanya Moore presents to [facility] for follow-up beta HCG lab. She was seen in MAU for abdominal pain on 10/21/21. Patient denies any vaginal bleeding today. Reports abdominal pain 4/10; not currently taking any pain medication. Discussed with patient that we are following beta HCG levels today. Valid contact number for patient confirmed. I will call the patient with results. GAD-7 positive today. Pt currently follows with psychiatrist for medications, currently looking for new counselor. ? ?Beta HCG results: ?10/21/21 @ 1558 87  ?10/23/21 @ 1143 207  ? ?Results and patient history reviewed by Jaynie Collins, MD, who comments: ? ?Appropriate rise of HCG from 87 on 10/21/2021 to 207 on 10/23/2021. Pregnancy is still very, very early and will not be visualized on ultrasound yet. If patient is asymptomatic, can obtain viability ultrasound in about 2 weeks. But if she has concerning symptoms, may need to do another HCG and or ultrasound check sooner.  Please call to inform patient of results and recommendations. ?  ?Patient called and informed of plan for follow-up. Patient declines scheduling [place] at this time. This is not a desired pregnancy and patient would like to first contact [facility] to see if they will be able to confirm location prior to abortion services. Ectopic precautions reviewed with patient. Explained to patient that she may call to schedule [place] through our office to confirm location. Pt also interested in BTL consult; front office notified to schedule appt for patient.  ? ?Tanya Moore ?10/23/2021 11:04 AM  ? ?

## 2021-11-12 ENCOUNTER — Ambulatory Visit
Admission: RE | Admit: 2021-11-12 | Discharge: 2021-11-12 | Disposition: A | Discharge status: Home / Self Care | Payer: Medicaid Other | Source: Ambulatory Visit | Attending: Family Medicine | Admitting: Family Medicine

## 2021-11-12 DIAGNOSIS — Z0000 Encounter for general adult medical examination without abnormal findings: Secondary | ICD-10-CM

## 2021-11-12 DIAGNOSIS — Z1231 Encounter for screening mammogram for malignant neoplasm of breast: Secondary | ICD-10-CM

## 2021-11-13 ENCOUNTER — Telehealth (HOSPITAL_COMMUNITY): Payer: Self-pay | Admitting: *Deleted

## 2021-11-13 NOTE — Telephone Encounter (Signed)
VM from patient requesting a new rx for her Adderall. Per [facility] note it was discontinued at her last visit in March. Will notify her of this. ?

## 2021-11-17 ENCOUNTER — Telehealth (HOSPITAL_COMMUNITY): Payer: Self-pay | Admitting: *Deleted

## 2021-11-17 NOTE — Telephone Encounter (Signed)
Message left on my VM from patient stating she is doing "terrible" without her adderall and wants to speak with the person that said she could discontinue it. She had called last week or there was a pharmacy request for it and I did not respond to it after reading most recent note from [facility] NP stating it could be stopped as she wasn't reporting an issue from going without it for two weeks and a new rx for it was not in her MAR. Will forward this concern to [facility] NP that patient would like to speak with her.  ?

## 2021-11-18 ENCOUNTER — Telehealth (HOSPITAL_COMMUNITY): Payer: Self-pay | Admitting: Psychiatry

## 2021-11-18 ENCOUNTER — Telehealth (HOSPITAL_COMMUNITY): Payer: Self-pay | Admitting: *Deleted

## 2021-11-18 NOTE — Telephone Encounter (Signed)
Second call from patient wanting her Adderall to be send in as she states she is not doing well without it.  ?

## 2021-12-29 ENCOUNTER — Ambulatory Visit: Payer: Medicaid Other | Admitting: Obstetrics and Gynecology

## 2021-12-29 VITALS — BP 118/84 | HR 78 | Wt 147.0 lb

## 2021-12-29 DIAGNOSIS — Z3009 Encounter for other general counseling and advice on contraception: Secondary | ICD-10-CM

## 2021-12-29 NOTE — Progress Notes (Signed)
  CC: BTL counseling Subjective:    Patient ID: CLEVIE FIGUERA, female    DOB: [birth year], 41 y.o.   MRN: [id number]  HPI 41 yo G6P2 seen for birth control/ BTL counseling.  Pt has had c/s x 2.  Pt states she tried an IUD in the past and expelled it.  She does not desire to try the device again.  She does not want any OCPs or other hormonal treatments as it affects her mood.  Pt has had her right tube removed previously due to ectopic pregnancy.  (Heterotopic)  Another ectopic pregnancy was treated medically.  Pt wants definitive therapy with laparoscopic salpingectomy.   Review of Systems     Objective:   Physical Exam Vitals:   12/29/21 0931  BP: 118/84  Pulse: 78   Past Surgical History:  Procedure Laterality Date   APPENDECTOMY     BUNIONECTOMY     CESAREAN SECTION  2017   CESAREAN SECTION N/A 11/27/2019   Procedure: CESAREAN SECTION AND IUD INSERTION;  Surgeon: Chancy Milroy, MD;  Location: [facility];  Service: Obstetrics;  Laterality: N/A;   DIAGNOSTIC LAPAROSCOPY WITH REMOVAL OF ECTOPIC PREGNANCY N/A 05/03/2019   Procedure: RIGHT SALPINGECTOMY AND REMOVAL OF ECTOPIC PREGNANCY;  Surgeon: Osborne Oman, MD;  Location: [place];  Service: Gynecology;  Laterality: N/A;   TONSILLECTOMY            Assessment & Plan:   1. Encounter for consultation for female sterilization Pt desires definitive therapy.  Will schedule pt for laparoscopic left salpingectomy.  Due to the four previous abdominal surgeries will schedule with 2 surgeons  Patient desires permanent sterilization.  Other reversible forms of contraception were discussed with patient; she declines all other modalities. Risks of procedure discussed with patient including but not limited to: risk of regret, permanence of method, bleeding, infection, injury to surrounding organs and need for additional procedures.  Failure risk of about 1% with increased risk of ectopic gestation if pregnancy occurs was also discussed with  patient.  Also discussed possibility of post-tubal pain syndrome. Patient verbalized understanding of these risks and wants to proceed with sterilization.   I spent 10 minutes dedicated to the care of this patient including previsit review of records, face to face time with the patient discussing treatment options, procedure and post visit testing.    Griffin Basil, MD Faculty Attending, [facility]

## 2021-12-31 ENCOUNTER — Telehealth: Payer: Self-pay

## 2021-12-31 NOTE — Telephone Encounter (Addendum)
-----   Message from Olevia Bowens sent at 12/31/2021  3:41 PM EDT ----- Regarding: NEEDS BTL CONSENT FOR BY JUNE 9TH Surgery Scheduled for 02/17/2022  Called pt to verify if she signed BTL paperwork at her visit on 12/29/21.  Pt reports that she has twice actually.  I informed pt that I would get the form scanned into her chart and will call her back if I have further questions.  Pt agreed.    Addison Naegeli, RN  12/31/21

## 2022-01-08 ENCOUNTER — Telehealth (HOSPITAL_COMMUNITY): Payer: Medicaid Other | Admitting: Psychiatry

## 2022-01-09 ENCOUNTER — Telehealth (INDEPENDENT_AMBULATORY_CARE_PROVIDER_SITE_OTHER): Payer: Medicaid Other | Admitting: Psychiatry

## 2022-01-09 ENCOUNTER — Encounter (HOSPITAL_COMMUNITY): Payer: Self-pay | Admitting: Psychiatry

## 2022-01-09 DIAGNOSIS — F900 Attention-deficit hyperactivity disorder, predominantly inattentive type: Secondary | ICD-10-CM

## 2022-01-09 DIAGNOSIS — F3181 Bipolar II disorder: Secondary | ICD-10-CM | POA: Diagnosis not present

## 2022-01-09 MED ORDER — AMPHETAMINE-DEXTROAMPHET ER 20 MG PO CP24: 20.0000 mg | ORAL_CAPSULE | Freq: Every day | ORAL | 0 refills | Status: DC

## 2022-01-09 MED ORDER — ARIPIPRAZOLE 10 MG PO TABS: 10.0000 mg | ORAL_TABLET | Freq: Every day | ORAL | 3 refills | Status: DC

## 2022-01-09 MED ORDER — SERTRALINE HCL 100 MG PO TABS: 100.0000 mg | ORAL_TABLET | Freq: Every day | ORAL | 3 refills | Status: DC

## 2022-01-09 NOTE — Progress Notes (Signed)
BH MD/PA/NP OP Progress Note Virtual Visit via Video Note  I connected with Tanya Moore on 01/09/22 at  8:30 AM EDT by a video enabled telemedicine application and verified that I am speaking with the correct person using two identifiers.  Location: Patient: Home Provider: Clinic   I discussed the limitations of evaluation and management by telemedicine and the availability of in person appointments. The patient expressed understanding and agreed to proceed.  I provided 30 minutes of non-face-to-face time during this encounter.   01/09/2022 8:35 AM Tanya Moore  MRN:  [id number]  Chief Complaint: "I have not been able to focus".   HPI: [id number] year old female seen today for follow up psychiatric evaluation. She has a psychiatric history of ADHD, Bipolar 2, anxiety, and depression. She is currently managed on Adderall XR 30 mg daily, Zoloft 50 mg daily, and Abilify 10 mg daily. She notes her Adderall was discontinued at her last visit and now reports that she has been unable to focus.  She reports her other medications are somewhat effective in managing her psychiatric conditions.  Today she is well groomed, pleasant, cooperative, engaged in conversation, and maintained eye contact. She informed Clinical research associatewriter that since being without Adderall she has been unable to focus.  She notes that she is distractible, forgetful, and inattentive to mentally taxing task.  She notes that she has locked her keys in her cards over 4 times because she is so forgetful.  Patient reports her anxiety and depression are well managed.  At times she notes that she worries about her finances, her children, and getting a job however notes that she is coping with it with the support of her partner.  Provider conducted a GAD-7 and patient scored an 11.  Provider also conducted a PHQ-9 and patient scored an 8.  She endorsed adequate sleep and appetite.  Today she denies SI/HI/VAH, mania, or paranoia.    Today she is agreeable to  restart Adderall at a lower dose of 20 mg.  She will continue her other medications as prescribed.  No other concerns noted at this time. Visit Diagnosis:    ICD-10-CM   1. Attention deficit hyperactivity disorder (ADHD), predominantly inattentive type  F90.0 amphetamine-dextroamphetamine (ADDERALL XR) 20 MG 24 hr capsule    2. Bipolar 2 disorder, major depressive episode (HCC)  F31.81 ARIPiprazole (ABILIFY) 10 MG tablet    sertraline (ZOLOFT) 100 MG tablet      Past Psychiatric History: History of anxiety, was diagnosed with bipolar disorder back in 2016.  Has been tried on several her medications.  [facility]CP [facility]  Stopped taking medications after she found out she is pregnant in 2020.  Past Medical History:  Past Medical History:  Diagnosis Date   ADHD    Allergy    Anxiety    Bipolar 1 disorder (HCC)    Depression    Ectopic pregnancy with intrauterine pregnancy/Heterotopic Pregnancy 05/03/2019   S/p laparoscopic right salpingectomy on 05/03/19 05/03/19 SURGICAL PATHOLOGY FALLOPIAN TUBE WITH ECTOPIC PREGNANCY, RIGHT, SALPINGECTOMY:  - Benign fallopian tube with chorionic villi consistent with ectopic pregnancy.    Headache    Hemoperitoneum due to rupture of right tubal ectopic pregnancy 05/03/2019   History of ectopic pregnancy in 11/2018 11/20/2018   Mtx#1 given 4/3: Hcg day 0 (147), day 4 (180), day 7 (193) Mtx # 2 given 4/9 (193)->4/12 (153)->4/15 (79)   Hyperlipidemia    Infection    UTI  Pregnancy induced hypertension    Psoriasis 2020    Past Surgical History:  Procedure Laterality Date   APPENDECTOMY     BUNIONECTOMY     CESAREAN SECTION  2017   CESAREAN SECTION N/A 11/27/2019   Procedure: CESAREAN SECTION AND IUD INSERTION;  Surgeon: Hermina Staggers, MD;  Location: [facility] LD [facility]S;  Service: Obstetrics;  Laterality: N/A;   DIAGNOSTIC LAPAROSCOPY WITH REMOVAL OF ECTOPIC PREGNANCY N/A 05/03/2019   Procedure: RIGHT SALPINGECTOMY AND REMOVAL OF  ECTOPIC PREGNANCY;  Surgeon: Tereso Newcomer, MD;  Location: [facility] OR;  Service: Gynecology;  Laterality: N/A;   TONSILLECTOMY      Family Psychiatric History: Sister depression   Family History:  Family History  Problem Relation Age of Onset   Hypertension Mother    Hypertension Father    Cancer Father        prostate   Depression Sister     Social History:  Social History   Socioeconomic History   Marital status: Single    Spouse name: Not on file   Number of children: Not on file   Years of education: Not on file   Highest education level: Not on file  Occupational History   Not on file  Tobacco Use   Smoking status: Former    Types: Cigarettes    Quit date: 2017    Years since quitting: 6.4   Smokeless tobacco: Never  Vaping Use   Vaping Use: Never used  Substance and Sexual Activity   Alcohol use: No   Drug use: No   Sexual activity: Not Currently    Birth control/protection: None  Other Topics Concern   Not on file  Social History Narrative   Not on file   Social Determinants of Health   Financial Resource Strain: Not on file  Food Insecurity: No Food Insecurity   Worried About Running Out of Food in the Last Year: Never true   Ran Out of Food in the Last Year: Never true  Transportation Needs: No Transportation Needs   Lack of Transportation (Medical): No   Lack of Transportation (Non-Medical): No  Physical Activity: Not on file  Stress: Not on file  Social Connections: Not on file    Allergies:  Allergies  Allergen Reactions   Lamictal [Lamotrigine] Rash    Patient reported    Metabolic Disorder Labs: Lab Results  Component Value Date   HGBA1C 4.9 05/26/2019   No results found for: PROLACTIN Lab Results  Component Value Date   CHOL 272 (H) 08/12/2021   TRIG 193 (H) 08/12/2021   HDL 49 08/12/2021   CHOLHDL 5.6 (H) 08/12/2021   LDLCALC 187 (H) 08/12/2021   LDLCALC 228 (H) 06/05/2020   No results found for: TSH  Therapeutic Level  Labs: No results found for: LITHIUM No results found for: VALPROATE No components found for:  CBMZ  Current Medications: Current Outpatient Medications  Medication Sig Dispense Refill   amphetamine-dextroamphetamine (ADDERALL XR) 20 MG 24 hr capsule Take 1 capsule (20 mg total) by mouth daily. 30 capsule 0   acetaminophen (TYLENOL) 325 MG tablet Take 2 tablets (650 mg total) by mouth every 6 (six) hours as needed. (Patient not taking: Reported on 12/29/2021) 30 tablet 0   ARIPiprazole (ABILIFY) 10 MG tablet Take 1 tablet (10 mg total) by mouth daily. 30 tablet 3   atorvastatin (LIPITOR) 20 MG tablet Take 1 tablet (20 mg total) by mouth daily. 90 tablet 3   Calcium Carb-Cholecalciferol (CALCIUM +  D3) 600-200 MG-UNIT TABS Take 1 tablet by mouth daily.  (Patient not taking: Reported on 10/23/2021)     sertraline (ZOLOFT) 100 MG tablet Take 1 tablet (100 mg total) by mouth daily with breakfast. 30 tablet 3   No current facility-administered medications for this visit.     Musculoskeletal: Strength & Muscle Tone:  Unable to assess due to telehealth visit Gait & Station:  Unable to assess due to telehealth visit Patient leans: N/A  Psychiatric Specialty Exam: Review of Systems  There were no vitals taken for this visit.There is no height or weight on file to calculate BMI.  General Appearance: Well Groomed  Eye Contact:  Good  Speech:  Clear and Coherent and Normal Rate  Volume:  Normal  Mood:  Euthymic  Affect:  Appropriate and Congruent  Thought Process:  Coherent, Goal Directed, and Linear  Orientation:  Full (Time, Place, and Person)  Thought Content: WDL and Logical   Suicidal Thoughts:  No  Homicidal Thoughts:  No  Memory:  Immediate;   Fair Recent;   Fair Remote;   Fair  Judgement:  Good  Insight:  Good  Psychomotor Activity:  Normal  Concentration:  Concentration: Fair and Attention Span: Fair  Recall:  Good  Fund of Knowledge: Good  Language: Good  Akathisia:  No   Handed:  Right  AIMS (if indicated): not done  Assets:  Communication Skills Desire for Improvement Financial Resources/Insurance Housing Intimacy Leisure Time Physical Health Social Support  ADL's:  Intact  Cognition: WNL  Sleep:  Good   Screenings: GAD-7    Flowsheet Row Video Visit from 01/09/2022 in [facility] Office Visit from 12/29/2021 in [facility] [employer] Lab from 10/23/2021 in [facility] [employer] [facility] Video Visit from 07/23/2021 in [facility] Video Visit from 04/18/2021 in [facility]  Total GAD-7 Score PHQ2-9    Flowsheet Row Video Visit from 01/09/2022 in [facility] Office Visit from 12/29/2021 in [facility] Lab from 10/23/2021 in [facility] Office Visit from 08/12/2021 in [facility] Video Visit from 07/23/2021 in [facility]  PHQ-2 Total Score PHQ-9 Total Score Flowsheet Row Admission (Discharged) from 10/21/2021 in [facility] Counselor from 04/29/2021 in [facility] Video Visit from 02/04/2021 in [facility]  C-SSRS RISK CATEGORY No Risk Low Risk No Risk        Assessment and Plan: Patient endorses symptoms of ADHD however notes that her mood, anxiety, and depression are well managed.  She is agreeable to restarting Adderall at a lower dose of 20 mg daily.  She will continue her other medications as prescribed  1. Bipolar 2 disorder, major depressive episode (HCC)  Continue- ARIPiprazole (ABILIFY) 10 MG tablet; Take 1 tablet (10 mg total) by mouth daily.  Dispense: 30 tablet; Refill:  3 Continue- sertraline (ZOLOFT) 100 MG tablet; Take 1 tablet (100 mg total) by mouth daily with breakfast.  Dispense: 30 tablet; Refill: 3  2. Attention deficit hyperactivity disorder (ADHD), predominantly inattentive type  Reduce/restart- amphetamine-dextroamphetamine (ADDERALL XR) 20 MG 24  hr capsule; Take 1 capsule (20 mg total) by mouth daily.  Dispense: 30 capsule; Refill: 0   Follow up in 3 months   Shanna Cisco, NP 01/09/2022, 8:35 AM

## 2022-01-13 ENCOUNTER — Encounter: Payer: Self-pay | Admitting: *Deleted

## 2022-02-06 NOTE — Pre-Procedure Instructions (Signed)
Surgical Instructions    Your procedure is scheduled on Tuesday, July 11th.  Report to [facility] Main Entrance "A" at 05:30 A.M., then check in with the Admitting office.  Call this number if you have problems the morning of surgery:  [phone]   If you have any questions prior to your surgery date call [phone]: Open Monday-Friday 8am-4pm    Remember:  Do not eat or drink after midnight the night before your surgery     Take these medicines the morning of surgery with A SIP OF WATER  ARIPiprazole (ABILIFY)  atorvastatin (LIPITOR)  sertraline (ZOLOFT)    As of today, STOP taking any Aspirin (unless otherwise instructed by your surgeon) Aleve, Naproxen, Ibuprofen, Motrin, Advil, Goody's, BC's, all herbal medications, fish oil, and all vitamins.                     Do NOT Smoke (Tobacco/Vaping) for 24 hours prior to your procedure.  If you use a CPAP at night, you may bring your mask/headgear for your overnight stay.   Contacts, glasses, piercing's, hearing aid's, dentures or partials may not be worn into surgery, please bring cases for these belongings.    For patients admitted to the hospital, discharge time will be determined by your treatment team.   Patients discharged the day of surgery will not be allowed to drive home, and someone needs to stay with them for 24 hours.  SURGICAL WAITING ROOM VISITATION Patients having surgery or a procedure may have two support people in the waiting room. These visitors may be switched out with other visitors if needed. Children under the age of 82 must have an adult accompany them who is not the patient. If the patient needs to stay at the hospital during part of their recovery, the visitor guidelines for inpatient rooms apply.  Please refer to the [facility] website for the visitor guidelines for Inpatients (after your surgery is over and you are in a regular room).    Special instructions:   [place]- Preparing For  Surgery  Before surgery, you can play an important role. Because skin is not sterile, your skin needs to be as free of germs as possible. You can reduce the number of germs on your skin by washing with CHG (chlorahexidine gluconate) Soap before surgery.  CHG is an antiseptic cleaner which kills germs and bonds with the skin to continue killing germs even after washing.    Oral Hygiene is also important to reduce your risk of infection.  Remember - BRUSH YOUR TEETH THE MORNING OF SURGERY WITH YOUR REGULAR TOOTHPASTE  Please do not use if you have an allergy to CHG or antibacterial soaps. If your skin becomes reddened/irritated stop using the CHG.  Do not shave (including legs and underarms) for at least 48 hours prior to first CHG shower. It is OK to shave your face.  Please follow these instructions carefully.   Shower the NIGHT BEFORE SURGERY and the MORNING OF SURGERY  If you chose to wash your hair, wash your hair first as usual with your normal shampoo.  After you shampoo, rinse your hair and body thoroughly to remove the shampoo.  Use CHG Soap as you would any other liquid soap. You can apply CHG directly to the skin and wash gently with a scrungie or a clean washcloth.   Apply the CHG Soap to your body ONLY FROM THE NECK DOWN.  Do not use on open wounds or open sores.  Avoid contact with your eyes, ears, mouth and genitals (private parts). Wash Face and genitals (private parts)  with your normal soap.   Wash thoroughly, paying special attention to the area where your surgery will be performed.  Thoroughly rinse your body with warm water from the neck down.  DO NOT shower/wash with your normal soap after using and rinsing off the CHG Soap.  Pat yourself dry with a CLEAN TOWEL.  Wear CLEAN PAJAMAS to bed the night before surgery  Place CLEAN SHEETS on your bed the night before your surgery  DO NOT SLEEP WITH PETS.   Day of Surgery: Take a shower with CHG soap. Do not wear  jewelry or makeup Do not wear lotions, powders, perfumes, or deodorant. Do not shave 48 hours prior to surgery.  Do not bring valuables to the hospital.  [facility] is not responsible for any belongings or valuables. Do not wear nail polish, gel polish, artificial nails, or any other type of covering on natural nails (fingers and toes) If you have artificial nails or gel coating that need to be removed by a nail salon, please have this removed prior to surgery. Artificial nails or gel coating may interfere with anesthesia's ability to adequately monitor your vital signs. Wear Clean/Comfortable clothing the morning of surgery Remember to brush your teeth WITH YOUR REGULAR TOOTHPASTE.   Please read over the following fact sheets that you were given.    If you received a COVID test during your pre-op visit  it is requested that you wear a mask when out in public, stay away from anyone that may not be feeling well and notify your surgeon if you develop symptoms. If you have been in contact with anyone that has tested positive in the last 10 days please notify you surgeon.

## 2022-02-08 NOTE — Addendum Note (Signed)
Addended by: Warden Fillers on: 02/08/2022 10:25 PM   Modules accepted: Orders

## 2022-02-09 ENCOUNTER — Other Ambulatory Visit: Payer: Self-pay

## 2022-02-09 ENCOUNTER — Other Ambulatory Visit (HOSPITAL_COMMUNITY): Payer: Self-pay | Admitting: Psychiatry

## 2022-02-09 ENCOUNTER — Encounter (HOSPITAL_COMMUNITY)
Admission: RE | Admit: 2022-02-09 | Discharge: 2022-02-09 | Disposition: A | Discharge status: Home / Self Care | Payer: Medicaid Other | Source: Ambulatory Visit | Attending: Obstetrics and Gynecology | Admitting: Obstetrics and Gynecology

## 2022-02-09 ENCOUNTER — Encounter (HOSPITAL_COMMUNITY): Payer: Self-pay | Admitting: *Deleted

## 2022-02-09 ENCOUNTER — Telehealth (HOSPITAL_COMMUNITY): Payer: Self-pay | Admitting: *Deleted

## 2022-02-09 DIAGNOSIS — Z3009 Encounter for other general counseling and advice on contraception: Secondary | ICD-10-CM | POA: Diagnosis not present

## 2022-02-09 DIAGNOSIS — F900 Attention-deficit hyperactivity disorder, predominantly inattentive type: Secondary | ICD-10-CM

## 2022-02-09 DIAGNOSIS — Z01812 Encounter for preprocedural laboratory examination: Secondary | ICD-10-CM | POA: Insufficient documentation

## 2022-02-09 LAB — CBC WITH DIFFERENTIAL/PLATELET
Abs Immature Granulocytes: 0.05 10*3/uL (ref 0.00–0.07)
Basophils Absolute: 0.1 10*3/uL (ref 0.0–0.1)
Basophils Relative: 1 %
Eosinophils Absolute: 0 10*3/uL (ref 0.0–0.5)
Eosinophils Relative: 0 %
HCT: 43.3 % (ref 36.0–46.0)
Hemoglobin: 14.5 g/dL (ref 12.0–15.0)
Immature Granulocytes: 0 %
Lymphocytes Relative: 31 %
Lymphs Abs: 3.8 10*3/uL (ref 0.7–4.0)
MCH: 29.8 pg (ref 26.0–34.0)
MCHC: 33.5 g/dL (ref 30.0–36.0)
MCV: 89.1 fL (ref 80.0–100.0)
Monocytes Absolute: 0.4 10*3/uL (ref 0.1–1.0)
Monocytes Relative: 4 %
Neutro Abs: 8 10*3/uL — ABNORMAL HIGH (ref 1.7–7.7)
Neutrophils Relative %: 64 %
Platelets: 314 10*3/uL (ref 150–400)
RBC: 4.86 MIL/uL (ref 3.87–5.11)
RDW: 13 % (ref 11.5–15.5)
WBC: 12.4 10*3/uL — ABNORMAL HIGH (ref 4.0–10.5)
nRBC: 0 % (ref 0.0–0.2)

## 2022-02-09 LAB — COMPREHENSIVE METABOLIC PANEL
ALT: 23 U/L (ref 0–44)
AST: 20 U/L (ref 15–41)
Albumin: 4.1 g/dL (ref 3.5–5.0)
Alkaline Phosphatase: 85 U/L (ref 38–126)
Anion gap: 8 (ref 5–15)
BUN: 8 mg/dL (ref 6–20)
CO2: 24 mmol/L (ref 22–32)
Calcium: 9.4 mg/dL (ref 8.9–10.3)
Chloride: 108 mmol/L (ref 98–111)
Creatinine, Ser: 0.67 mg/dL (ref 0.44–1.00)
GFR, Estimated: >60 mL/min (ref >60)
Glucose, Bld: 90 mg/dL (ref 70–99)
Potassium: 3.6 mmol/L (ref 3.5–5.1)
Sodium: 140 mmol/L (ref 135–145)
Total Bilirubin: 0.5 mg/dL (ref 0.3–1.2)
Total Protein: 7 g/dL (ref 6.5–8.1)

## 2022-02-09 LAB — TYPE AND SCREEN
ABO/RH(D): O POS
Antibody Screen: NEGATIVE

## 2022-02-09 MED ORDER — AMPHETAMINE-DEXTROAMPHET ER 20 MG PO CP24: 20.0000 mg | ORAL_CAPSULE | Freq: Every day | ORAL | 0 refills | Status: DC

## 2022-02-09 NOTE — Telephone Encounter (Signed)
Tanya Moore is out of Office Forwarding Refill Request to Dr Toy Cookey  Patient called Requested Refill  amphetamine-dextroamphetamine (ADDERALL XR) 20 MG 24 hr capsule Take 1 capsule (20 mg total) by mouth daily

## 2022-02-09 NOTE — Telephone Encounter (Signed)
Medications refilled and sent to preferred pharmacy.

## 2022-02-09 NOTE — Progress Notes (Signed)
PCP - Dr. Maury Dus Cardiologist - denies  PPM/ICD - denies   Chest x-ray - denies EKG - denies Stress Test - denies ECHO - denies Cardiac Cath - denies  Sleep Study - denies  DM- denies  ASA/Blood Thinner Instructions: n/a   ERAS Protcol - no, NPO   COVID TEST- n/a   Anesthesia review: no  Patient denies shortness of breath, fever, cough and chest pain at PAT appointment   All instructions explained to the patient, with a verbal understanding of the material. Patient agrees to go over the instructions while at home for a better understanding.  The opportunity to ask questions was provided.

## 2022-02-12 ENCOUNTER — Ambulatory Visit: Payer: Medicaid Other | Admitting: Obstetrics and Gynecology

## 2022-02-12 ENCOUNTER — Other Ambulatory Visit: Payer: Self-pay

## 2022-02-12 VITALS — BP 121/82 | HR 77 | Wt 144.0 lb

## 2022-02-12 DIAGNOSIS — Z01818 Encounter for other preprocedural examination: Secondary | ICD-10-CM | POA: Diagnosis not present

## 2022-02-12 DIAGNOSIS — Z5941 Food insecurity: Secondary | ICD-10-CM | POA: Diagnosis not present

## 2022-02-12 NOTE — H&P (View-Only) (Signed)
OB/GYN Pre-Op History and Physical  Tanya Moore is a 41 y.o. G2R4270 presenting for preoperative visit . Pt desires removal of her remaining tube for permanent sterility.  Her right tube was previously removed due to an ectopic pregnancy.    Patient desires permanent sterilization.  Other reversible forms of contraception (over the counter/barrier methods; hormonal contraceptives including pill, patch, ring, Depo-Provera injection, Nexplanon implant; hormonal IUDs Skyla and Mirena; nonhormonal copper IUD Paragard) were discussed with patient; she declined all these modalities. Also discussed the option of vasectomy for her female partner; she also declined this option.  For the bilateral salpingectomy, she was told that both tubes will be resected via three small incisions; the failure risk of less than 1%.  Any future pregnancies will have to be attempted via IVF or other fertility procedures.  Reiterated permanence and irreversibility of the procedure.  Also emphasized risk of regret which is noted more in patients less than the age of 29.  All questions were answered. She desires laparoscopic bilateral salpingectomy.  Other risks of the procedure were discussed with patient including but not limited to: bleeding, infection, injury to surrounding organs and need for additional procedures.  Also discussed possibility of post-tubal pain syndrome. Patient verbalized understanding of these risks and wants to proceed with this procedure.  She was told that she will be contacted by our surgical scheduler regarding the time and date of her surgery; routine preoperative instructions of having nothing to eat or drink after midnight on the day prior to surgery and also coming to the hospital 1.5 hours prior to her time of surgery were also emphasized.  She was told she may be called for a preoperative appointment about a week prior to surgery and will be given further preoperative instructions at that visit.  Printed patient education handouts about the procedure were given to the patient to review at home.        Past Medical History:  Diagnosis Date   ADHD    Allergy    Anxiety    Bipolar 1 disorder (HCC)    Depression    Ectopic pregnancy with intrauterine pregnancy/Heterotopic Pregnancy 05/03/2019   S/p laparoscopic right salpingectomy on 05/03/19 05/03/19 SURGICAL PATHOLOGY FALLOPIAN TUBE WITH ECTOPIC PREGNANCY, RIGHT, SALPINGECTOMY:  - Benign fallopian tube with chorionic villi consistent with ectopic pregnancy.    Headache    Hemoperitoneum due to rupture of right tubal ectopic pregnancy 05/03/2019   History of ectopic pregnancy in 11/2018 11/20/2018   Mtx#1 given 4/3: Hcg day 0 (147), day 4 (180), day 7 (193) Mtx # 2 given 4/9 (193)->4/12 (153)->4/15 (79)   Hyperlipidemia    Infection    UTI   Pregnancy induced hypertension    Psoriasis 2020    Past Surgical History:  Procedure Laterality Date   APPENDECTOMY     BUNIONECTOMY     CESAREAN SECTION  2017   CESAREAN SECTION N/A 11/27/2019   Procedure: CESAREAN SECTION AND IUD INSERTION;  Surgeon: Hermina Staggers, MD;  Location: [facility] LD [facility]S;  Service: Obstetrics;  Laterality: N/A;   DIAGNOSTIC LAPAROSCOPY WITH REMOVAL OF ECTOPIC PREGNANCY N/A 05/03/2019   Procedure: RIGHT SALPINGECTOMY AND REMOVAL OF ECTOPIC PREGNANCY;  Surgeon: Tereso Newcomer, MD;  Location: [facility] OR;  Service: Gynecology;  Laterality: N/A;   TONSILLECTOMY      OB History  Gravida Para Term Preterm AB Living  6 2 2   3 2   SAB IAB Ectopic Multiple Live Births    1  2 0 2    # Outcome Date GA Lbr Len/2nd Weight Sex Delivery Anes PTL Lv  6 Gravida           5 Term 11/27/19 [redacted]w[redacted]d  6 lb 15.8 oz (3.17 kg) F CS-LTranv Spinal  LIV  4 Ectopic 04/2019          3 Ectopic 11/2018             Birth Comments: MTX x2 doses  2 Term 2017     CS-LTranv     1 IAB 2005            Social History   Socioeconomic History   Marital status: Significant Other    Spouse name:  Not on file   Number of children: 2   Years of education: Not on file   Highest education level: Not on file  Occupational History   Not on file  Tobacco Use   Smoking status: Former    Types: Cigarettes    Quit date: 2017    Years since quitting: 6.5   Smokeless tobacco: Never  Vaping Use   Vaping Use: Some days  Substance and Sexual Activity   Alcohol use: No   Drug use: No   Sexual activity: Not Currently    Birth control/protection: None  Other Topics Concern   Not on file  Social History Narrative   Not on file   Social Determinants of Health   Financial Resource Strain: Not on file  Food Insecurity: Food Insecurity Present (02/12/2022)   Hunger Vital Sign    Worried About Running Out of Food in the Last Year: Sometimes true    Ran Out of Food in the Last Year: Never true  Transportation Needs: No Transportation Needs (02/12/2022)   PRAPARE - Administrator, Civil Service (Medical): No    Lack of Transportation (Non-Medical): No  Physical Activity: Not on file  Stress: Not on file  Social Connections: Not on file    Family History  Problem Relation Age of Onset   Hypertension Mother    Hypertension Father    Cancer Father        prostate   Depression Sister     (Not in a hospital admission)   Allergies  Allergen Reactions   Lamictal [Lamotrigine] Rash    Patient reported    Review of Systems: Negative except for what is mentioned in HPI.     Physical Exam: BP 121/82   Pulse 77   Wt 144 lb (65.3 kg)   LMP 01/27/2022 (Exact Date)   BMI 24.72 kg/m  CONSTITUTIONAL: Well-developed, well-nourished female in no acute distress.  HENT:  Normocephalic, atraumatic, External right and left ear normal. Oropharynx is clear and moist EYES: Conjunctivae and EOM are normal.  NECK: Normal range of motion, supple, no masses SKIN: Skin is warm and dry. No rash noted. Not diaphoretic. No erythema. No pallor. NEUROLGIC: Alert and oriented to person,  place, and time. Normal reflexes, muscle tone coordination. No cranial nerve deficit noted. PSYCHIATRIC: Normal mood and affect. Normal behavior. Normal judgment and thought content. CARDIOVASCULAR: Normal heart rate noted, regular rhythm RESPIRATORY: Effort and breath sounds normal, no problems with respiration noted ABDOMEN: Soft, nontender, nondistended. Well-healed Pfannenstiel incision. PELVIC: Deferred MUSCULOSKELETAL: Normal range of motion. No edema and no tenderness. 2+ distal pulses.   Pertinent Labs/Studies:   Results for orders placed or performed during the hospital encounter of 02/09/22 (from the past 72 hour(s))  Type  and screen     Status: None   Collection Time: 02/09/22 11:33 AM  Result Value Ref Range   ABO/RH(D) O POS    Antibody Screen NEG    Sample Expiration 02/23/2022,2359    Extend sample reason      NO TRANSFUSIONS OR PREGNANCY IN THE PAST 3 MONTHS Performed at [facility], [address] [place], [place] [address]   CBC with Differential/Platelet     Status: Abnormal   Collection Time: 02/09/22 11:45 AM  Result Value Ref Range   WBC 12.4 (H) 4.0 - 10.5 K/uL   RBC 4.86 3.87 - 5.11 MIL/uL   Hemoglobin 14.5 12.0 - 15.0 g/dL   HCT 43.3 36.0 - 46.0 %   MCV 89.1 80.0 - 100.0 fL   MCH 29.8 26.0 - 34.0 pg   MCHC 33.5 30.0 - 36.0 g/dL   RDW 13.0 11.5 - 15.5 %   Platelets 314 150 - 400 K/uL   nRBC 0.0 0.0 - 0.2 %   Neutrophils Relative % 64 %   Neutro Abs 8.0 (H) 1.7 - 7.7 K/uL   Lymphocytes Relative 31 %   Lymphs Abs 3.8 0.7 - 4.0 K/uL   Monocytes Relative 4 %   Monocytes Absolute 0.4 0.1 - 1.0 K/uL   Eosinophils Relative 0 %   Eosinophils Absolute 0.0 0.0 - 0.5 K/uL   Basophils Relative 1 %   Basophils Absolute 0.1 0.0 - 0.1 K/uL   Immature Granulocytes 0 %   Abs Immature Granulocytes 0.05 0.00 - 0.07 K/uL    Comment: Performed at [facility], [address] [place], [place] [address]  Comprehensive metabolic panel     Status: None    Collection Time: 02/09/22 11:45 AM  Result Value Ref Range   Sodium 140 135 - 145 mmol/L   Potassium 3.6 3.5 - 5.1 mmol/L   Chloride 108 98 - 111 mmol/L   CO2 24 22 - 32 mmol/L   Glucose, Bld 90 70 - 99 mg/dL    Comment: Glucose reference range applies only to samples taken after fasting for at least 8 hours.   BUN 8 6 - 20 mg/dL   Creatinine, Ser 0.67 0.44 - 1.00 mg/dL   Calcium 9.4 8.9 - 10.3 mg/dL   Total Protein 7.0 6.5 - 8.1 g/dL   Albumin 4.1 3.5 - 5.0 g/dL   AST 20 15 - 41 U/L   ALT 23 0 - 44 U/L   Alkaline Phosphatase 85 38 - 126 U/L   Total Bilirubin 0.5 0.3 - 1.2 mg/dL   GFR, Estimated >60 >60 mL/min    Comment: (NOTE) Calculated using the CKD-EPI Creatinine Equation (2021)    Anion gap 8 5 - 15    Comment: Performed at [facility], [address] [place], [place] [address]       Assessment and Plan :Gwyneth D Danley is a 40 y.o. G6P2032 here for preoperative evaluation.   Plan for laparoscopic left salpingectomy NPO Admission labs ordered Risks and benefits discussed in detail with patient.  She wishes to proceed.   Fabrizzio Marcella, M.D. Attending Obstetrician & Gynecologist, [facility], [facility]  

## 2022-02-12 NOTE — Progress Notes (Signed)
OB/GYN Pre-Op History and Physical  Tanya Moore is a 41 y.o. G2R4270 presenting for preoperative visit . Pt desires removal of her remaining tube for permanent sterility.  Her right tube was previously removed due to an ectopic pregnancy.    Patient desires permanent sterilization.  Other reversible forms of contraception (over the counter/barrier methods; hormonal contraceptives including pill, patch, ring, Depo-Provera injection, Nexplanon implant; hormonal IUDs Skyla and Mirena; nonhormonal copper IUD Paragard) were discussed with patient; she declined all these modalities. Also discussed the option of vasectomy for her female partner; she also declined this option.  For the bilateral salpingectomy, she was told that both tubes will be resected via three small incisions; the failure risk of less than 1%.  Any future pregnancies will have to be attempted via IVF or other fertility procedures.  Reiterated permanence and irreversibility of the procedure.  Also emphasized risk of regret which is noted more in patients less than the age of 29.  All questions were answered. She desires laparoscopic bilateral salpingectomy.  Other risks of the procedure were discussed with patient including but not limited to: bleeding, infection, injury to surrounding organs and need for additional procedures.  Also discussed possibility of post-tubal pain syndrome. Patient verbalized understanding of these risks and wants to proceed with this procedure.  She was told that she will be contacted by our surgical scheduler regarding the time and date of her surgery; routine preoperative instructions of having nothing to eat or drink after midnight on the day prior to surgery and also coming to the hospital 1.5 hours prior to her time of surgery were also emphasized.  She was told she may be called for a preoperative appointment about a week prior to surgery and will be given further preoperative instructions at that visit.  Printed patient education handouts about the procedure were given to the patient to review at home.        Past Medical History:  Diagnosis Date   ADHD    Allergy    Anxiety    Bipolar 1 disorder (HCC)    Depression    Ectopic pregnancy with intrauterine pregnancy/Heterotopic Pregnancy 05/03/2019   S/p laparoscopic right salpingectomy on 05/03/19 05/03/19 SURGICAL PATHOLOGY FALLOPIAN TUBE WITH ECTOPIC PREGNANCY, RIGHT, SALPINGECTOMY:  - Benign fallopian tube with chorionic villi consistent with ectopic pregnancy.    Headache    Hemoperitoneum due to rupture of right tubal ectopic pregnancy 05/03/2019   History of ectopic pregnancy in 11/2018 11/20/2018   Mtx#1 given 4/3: Hcg day 0 (147), day 4 (180), day 7 (193) Mtx # 2 given 4/9 (193)->4/12 (153)->4/15 (79)   Hyperlipidemia    Infection    UTI   Pregnancy induced hypertension    Psoriasis 2020    Past Surgical History:  Procedure Laterality Date   APPENDECTOMY     BUNIONECTOMY     CESAREAN SECTION  2017   CESAREAN SECTION N/A 11/27/2019   Procedure: CESAREAN SECTION AND IUD INSERTION;  Surgeon: Hermina Staggers, MD;  Location: [facility] LD ORS;  Service: Obstetrics;  Laterality: N/A;   DIAGNOSTIC LAPAROSCOPY WITH REMOVAL OF ECTOPIC PREGNANCY N/A 05/03/2019   Procedure: RIGHT SALPINGECTOMY AND REMOVAL OF ECTOPIC PREGNANCY;  Surgeon: Tereso Newcomer, MD;  Location: [facility] OR;  Service: Gynecology;  Laterality: N/A;   TONSILLECTOMY      OB History  Gravida Para Term Preterm AB Living  6 2 2   3 2   SAB IAB Ectopic Multiple Live Births    1  2 0 2    # Outcome Date GA Lbr Len/2nd Weight Sex Delivery Anes PTL Lv  6 Gravida           5 Term 11/27/19 [redacted]w[redacted]d  6 lb 15.8 oz (3.17 kg) F CS-LTranv Spinal  LIV  4 Ectopic 04/2019          3 Ectopic 11/2018             Birth Comments: MTX x2 doses  2 Term 2017     CS-LTranv     1 IAB 2005            Social History   Socioeconomic History   Marital status: Significant Other    Spouse name:  Not on file   Number of children: 2   Years of education: Not on file   Highest education level: Not on file  Occupational History   Not on file  Tobacco Use   Smoking status: Former    Types: Cigarettes    Quit date: 2017    Years since quitting: 6.5   Smokeless tobacco: Never  Vaping Use   Vaping Use: Some days  Substance and Sexual Activity   Alcohol use: No   Drug use: No   Sexual activity: Not Currently    Birth control/protection: None  Other Topics Concern   Not on file  Social History Narrative   Not on file   Social Determinants of Health   Financial Resource Strain: Not on file  Food Insecurity: Food Insecurity Present (02/12/2022)   Hunger Vital Sign    Worried About Running Out of Food in the Last Year: Sometimes true    Ran Out of Food in the Last Year: Never true  Transportation Needs: No Transportation Needs (02/12/2022)   PRAPARE - Administrator, Civil Service (Medical): No    Lack of Transportation (Non-Medical): No  Physical Activity: Not on file  Stress: Not on file  Social Connections: Not on file    Family History  Problem Relation Age of Onset   Hypertension Mother    Hypertension Father    Cancer Father        prostate   Depression Sister     (Not in a hospital admission)   Allergies  Allergen Reactions   Lamictal [Lamotrigine] Rash    Patient reported    Review of Systems: Negative except for what is mentioned in HPI.     Physical Exam: BP 121/82   Pulse 77   Wt 144 lb (65.3 kg)   LMP 01/27/2022 (Exact Date)   BMI 24.72 kg/m  CONSTITUTIONAL: Well-developed, well-nourished female in no acute distress.  HENT:  Normocephalic, atraumatic, External right and left ear normal. Oropharynx is clear and moist EYES: Conjunctivae and EOM are normal.  NECK: Normal range of motion, supple, no masses SKIN: Skin is warm and dry. No rash noted. Not diaphoretic. No erythema. No pallor. NEUROLGIC: Alert and oriented to person,  place, and time. Normal reflexes, muscle tone coordination. No cranial nerve deficit noted. PSYCHIATRIC: Normal mood and affect. Normal behavior. Normal judgment and thought content. CARDIOVASCULAR: Normal heart rate noted, regular rhythm RESPIRATORY: Effort and breath sounds normal, no problems with respiration noted ABDOMEN: Soft, nontender, nondistended. Well-healed Pfannenstiel incision. PELVIC: Deferred MUSCULOSKELETAL: Normal range of motion. No edema and no tenderness. 2+ distal pulses.   Pertinent Labs/Studies:   Results for orders placed or performed during the hospital encounter of 02/09/22 (from the past 72 hour(s))  Type  and screen     Status: None   Collection Time: 02/09/22 11:33 AM  Result Value Ref Range   ABO/RH(D) O POS    Antibody Screen NEG    Sample Expiration 02/23/2022,2359    Extend sample reason      NO TRANSFUSIONS OR PREGNANCY IN THE PAST 3 MONTHS Performed at [facility], [address] [place], [place] [address]   CBC with Differential/Platelet     Status: Abnormal   Collection Time: 02/09/22 11:45 AM  Result Value Ref Range   WBC 12.4 (H) 4.0 - 10.5 K/uL   RBC 4.86 3.87 - 5.11 MIL/uL   Hemoglobin 14.5 12.0 - 15.0 g/dL   HCT 14.4 31.5 - 40.0 %   MCV 89.1 80.0 - 100.0 fL   MCH 29.8 26.0 - 34.0 pg   MCHC 33.5 30.0 - 36.0 g/dL   RDW 86.7 61.9 - 50.9 %   Platelets 314 150 - 400 K/uL   nRBC 0.0 0.0 - 0.2 %   Neutrophils Relative % 64 %   Neutro Abs 8.0 (H) 1.7 - 7.7 K/uL   Lymphocytes Relative 31 %   Lymphs Abs 3.8 0.7 - 4.0 K/uL   Monocytes Relative 4 %   Monocytes Absolute 0.4 0.1 - 1.0 K/uL   Eosinophils Relative 0 %   Eosinophils Absolute 0.0 0.0 - 0.5 K/uL   Basophils Relative 1 %   Basophils Absolute 0.1 0.0 - 0.1 K/uL   Immature Granulocytes 0 %   Abs Immature Granulocytes 0.05 0.00 - 0.07 K/uL    Comment: Performed at [facility], [address] [place], [place] [address]  Comprehensive metabolic panel     Status: None    Collection Time: 02/09/22 11:45 AM  Result Value Ref Range   Sodium 140 135 - 145 mmol/L   Potassium 3.6 3.5 - 5.1 mmol/L   Chloride 108 98 - 111 mmol/L   CO2 24 22 - 32 mmol/L   Glucose, Bld 90 70 - 99 mg/dL    Comment: Glucose reference range applies only to samples taken after fasting for at least 8 hours.   BUN 8 6 - 20 mg/dL   Creatinine, Ser 2.45 0.44 - 1.00 mg/dL   Calcium 9.4 8.9 - 80.9 mg/dL   Total Protein 7.0 6.5 - 8.1 g/dL   Albumin 4.1 3.5 - 5.0 g/dL   AST 20 15 - 41 U/L   ALT 23 0 - 44 U/L   Alkaline Phosphatase 85 38 - 126 U/L   Total Bilirubin 0.5 0.3 - 1.2 mg/dL   GFR, Estimated >98 >33 mL/min    Comment: (NOTE) Calculated using the CKD-EPI Creatinine Equation (2021)    Anion gap 8 5 - 15    Comment: Performed at [facility], [address], [place], [place] [address]       Assessment and Plan :BRUCE MAYERS is a 41 y.o. [id number] here for preoperative evaluation.   Plan for laparoscopic left salpingectomy NPO Admission labs ordered Risks and benefits discussed in detail with patient.  She wishes to proceed.   Mariel Aloe, M.D. Attending Obstetrician & Gynecologist, [facility] [employer], [facility]

## 2022-02-17 ENCOUNTER — Other Ambulatory Visit: Payer: Self-pay

## 2022-02-17 ENCOUNTER — Encounter (HOSPITAL_COMMUNITY): Payer: Self-pay | Admitting: Obstetrics and Gynecology

## 2022-02-17 ENCOUNTER — Ambulatory Visit (HOSPITAL_BASED_OUTPATIENT_CLINIC_OR_DEPARTMENT_OTHER): Payer: Medicaid Other

## 2022-02-17 ENCOUNTER — Ambulatory Visit (HOSPITAL_COMMUNITY)
Admission: RE | Admit: 2022-02-17 | Discharge: 2022-02-17 | Disposition: A | Discharge status: Home / Self Care | Payer: Medicaid Other | Attending: Obstetrics and Gynecology | Admitting: Obstetrics and Gynecology

## 2022-02-17 ENCOUNTER — Ambulatory Visit (HOSPITAL_COMMUNITY): Payer: Medicaid Other | Admitting: Vascular Surgery

## 2022-02-17 ENCOUNTER — Encounter (HOSPITAL_COMMUNITY)
Admission: RE | Disposition: A | Discharge status: Home / Self Care | Payer: Self-pay | Source: Home / Self Care | Attending: Obstetrics and Gynecology

## 2022-02-17 DIAGNOSIS — Z9079 Acquired absence of other genital organ(s): Secondary | ICD-10-CM | POA: Insufficient documentation

## 2022-02-17 DIAGNOSIS — Z87891 Personal history of nicotine dependence: Secondary | ICD-10-CM | POA: Diagnosis not present

## 2022-02-17 DIAGNOSIS — Z3009 Encounter for other general counseling and advice on contraception: Secondary | ICD-10-CM

## 2022-02-17 DIAGNOSIS — Z302 Encounter for sterilization: Secondary | ICD-10-CM

## 2022-02-17 HISTORY — PX: LAPAROSCOPIC BILATERAL SALPINGECTOMY: SHX5889

## 2022-02-17 LAB — POCT PREGNANCY, URINE: Preg Test, Ur: NEGATIVE

## 2022-02-17 SURGERY — SALPINGECTOMY, BILATERAL, LAPAROSCOPIC
Anesthesia: General | Site: Abdomen | Laterality: Left

## 2022-02-17 MED ORDER — FENTANYL CITRATE (PF) 100 MCG/2ML IJ SOLN: 25.0000 ug | INTRAMUSCULAR | Status: DC | PRN

## 2022-02-17 MED ORDER — OXYCODONE HCL 5 MG PO TABS: 5.0000 mg | ORAL_TABLET | ORAL | 0 refills | Status: DC | PRN

## 2022-02-17 MED ORDER — FERRIC SUBSULFATE 259 MG/GM EX SOLN
CUTANEOUS | Status: AC
  Filled 2022-02-17: qty 8

## 2022-02-17 MED ORDER — ONDANSETRON HCL 4 MG/2ML IJ SOLN
INTRAMUSCULAR | Status: DC | PRN
  Administered 2022-02-17: 4 mg via INTRAVENOUS

## 2022-02-17 MED ORDER — SUGAMMADEX SODIUM 200 MG/2ML IV SOLN
INTRAVENOUS | Status: DC | PRN
  Administered 2022-02-17: 200 mg via INTRAVENOUS

## 2022-02-17 MED ORDER — FERRIC SUBSULFATE 259 MG/GM EX SOLN
CUTANEOUS | Status: DC | PRN
  Administered 2022-02-17: 1

## 2022-02-17 MED ORDER — FENTANYL CITRATE (PF) 250 MCG/5ML IJ SOLN
INTRAMUSCULAR | Status: AC
  Filled 2022-02-17: qty 5

## 2022-02-17 MED ORDER — KETOROLAC TROMETHAMINE 15 MG/ML IJ SOLN
INTRAMUSCULAR | Status: DC | PRN
  Administered 2022-02-17: 15 mg via INTRAVENOUS

## 2022-02-17 MED ORDER — MIDAZOLAM HCL 2 MG/2ML IJ SOLN
INTRAMUSCULAR | Status: DC | PRN
  Administered 2022-02-17: 2 mg via INTRAVENOUS

## 2022-02-17 MED ORDER — KETOROLAC TROMETHAMINE 30 MG/ML IJ SOLN: 30.0000 mg | Freq: Once | INTRAMUSCULAR | Status: DC | PRN

## 2022-02-17 MED ORDER — CHLORHEXIDINE GLUCONATE 0.12 % MT SOLN
OROMUCOSAL | Status: AC
  Administered 2022-02-17: 15 mL via OROMUCOSAL
  Filled 2022-02-17: qty 15

## 2022-02-17 MED ORDER — ROCURONIUM BROMIDE 10 MG/ML (PF) SYRINGE
PREFILLED_SYRINGE | INTRAVENOUS | Status: AC
  Filled 2022-02-17: qty 10

## 2022-02-17 MED ORDER — IBUPROFEN 600 MG PO TABS: 600.0000 mg | ORAL_TABLET | Freq: Four times a day (QID) | ORAL | 2 refills | Status: DC | PRN

## 2022-02-17 MED ORDER — DEXMEDETOMIDINE (PRECEDEX) IN NS 20 MCG/5ML (4 MCG/ML) IV SYRINGE
PREFILLED_SYRINGE | INTRAVENOUS | Status: DC | PRN
  Administered 2022-02-17: 20 ug via INTRAVENOUS

## 2022-02-17 MED ORDER — CHLORHEXIDINE GLUCONATE 0.12 % MT SOLN: 15.0000 mL | Freq: Once | OROMUCOSAL | Status: AC

## 2022-02-17 MED ORDER — OXYCODONE HCL 5 MG PO TABS: 5.0000 mg | ORAL_TABLET | Freq: Once | ORAL | Status: DC | PRN

## 2022-02-17 MED ORDER — SOD CITRATE-CITRIC ACID 500-334 MG/5ML PO SOLN
ORAL | Status: AC
  Administered 2022-02-17: 30 mL via ORAL
  Filled 2022-02-17: qty 30

## 2022-02-17 MED ORDER — DEXAMETHASONE SODIUM PHOSPHATE 10 MG/ML IJ SOLN
INTRAMUSCULAR | Status: AC
  Filled 2022-02-17: qty 1

## 2022-02-17 MED ORDER — FENTANYL CITRATE (PF) 250 MCG/5ML IJ SOLN
INTRAMUSCULAR | Status: DC | PRN
  Administered 2022-02-17: 100 ug via INTRAVENOUS
  Administered 2022-02-17: 50 ug via INTRAVENOUS

## 2022-02-17 MED ORDER — KETOROLAC TROMETHAMINE 15 MG/ML IJ SOLN: 15.0000 mg | INTRAMUSCULAR | Status: AC

## 2022-02-17 MED ORDER — ORAL CARE MOUTH RINSE
15.0000 mL | Freq: Once | OROMUCOSAL | Status: AC
Start: 2022-02-17 — End: 2022-02-17

## 2022-02-17 MED ORDER — PROPOFOL 10 MG/ML IV BOLUS
INTRAVENOUS | Status: AC
  Filled 2022-02-17: qty 20

## 2022-02-17 MED ORDER — BUPIVACAINE HCL (PF) 0.25 % IJ SOLN
INTRAMUSCULAR | Status: AC
  Filled 2022-02-17: qty 30

## 2022-02-17 MED ORDER — LIDOCAINE 2% (20 MG/ML) 5 ML SYRINGE
INTRAMUSCULAR | Status: DC | PRN
  Administered 2022-02-17: 100 mg via INTRAVENOUS

## 2022-02-17 MED ORDER — POVIDONE-IODINE 10 % EX SWAB
2.0000 | Freq: Once | CUTANEOUS | Status: AC
  Administered 2022-02-17: 2 via TOPICAL

## 2022-02-17 MED ORDER — KETOROLAC TROMETHAMINE 15 MG/ML IJ SOLN
INTRAMUSCULAR | Status: AC
  Administered 2022-02-17: 15 mg via INTRAVENOUS
  Filled 2022-02-17: qty 1

## 2022-02-17 MED ORDER — LACTATED RINGERS IV SOLN: INTRAVENOUS | Status: DC

## 2022-02-17 MED ORDER — LIDOCAINE 2% (20 MG/ML) 5 ML SYRINGE
INTRAMUSCULAR | Status: AC
  Filled 2022-02-17: qty 5

## 2022-02-17 MED ORDER — ROCURONIUM BROMIDE 10 MG/ML (PF) SYRINGE
PREFILLED_SYRINGE | INTRAVENOUS | Status: DC | PRN
  Administered 2022-02-17: 60 mg via INTRAVENOUS

## 2022-02-17 MED ORDER — SOD CITRATE-CITRIC ACID 500-334 MG/5ML PO SOLN
30.0000 mL | ORAL | Status: AC
  Filled 2022-02-17: qty 30

## 2022-02-17 MED ORDER — PROPOFOL 10 MG/ML IV BOLUS
INTRAVENOUS | Status: DC | PRN
  Administered 2022-02-17: 150 mg via INTRAVENOUS

## 2022-02-17 MED ORDER — PHENYLEPHRINE 80 MCG/ML (10ML) SYRINGE FOR IV PUSH (FOR BLOOD PRESSURE SUPPORT)
PREFILLED_SYRINGE | INTRAVENOUS | Status: DC | PRN
  Administered 2022-02-17: 160 ug via INTRAVENOUS
  Administered 2022-02-17: 80 ug via INTRAVENOUS

## 2022-02-17 MED ORDER — ONDANSETRON HCL 4 MG/2ML IJ SOLN: 4.0000 mg | Freq: Once | INTRAMUSCULAR | Status: DC | PRN

## 2022-02-17 MED ORDER — MIDAZOLAM HCL 2 MG/2ML IJ SOLN
INTRAMUSCULAR | Status: AC
  Filled 2022-02-17: qty 2

## 2022-02-17 MED ORDER — ONDANSETRON HCL 4 MG/2ML IJ SOLN
INTRAMUSCULAR | Status: AC
  Filled 2022-02-17: qty 2

## 2022-02-17 MED ORDER — CEFAZOLIN SODIUM-DEXTROSE 2-4 GM/100ML-% IV SOLN
INTRAVENOUS | Status: AC
  Filled 2022-02-17: qty 100

## 2022-02-17 MED ORDER — BUPIVACAINE HCL 0.25 % IJ SOLN
INTRAMUSCULAR | Status: DC | PRN
  Administered 2022-02-17: 20 mL

## 2022-02-17 MED ORDER — SODIUM CHLORIDE 0.9 % IR SOLN
Status: DC | PRN
  Administered 2022-02-17: 350 mL

## 2022-02-17 MED ORDER — CEFAZOLIN SODIUM-DEXTROSE 2-4 GM/100ML-% IV SOLN
2.0000 g | INTRAVENOUS | Status: AC
  Administered 2022-02-17: 2 g via INTRAVENOUS

## 2022-02-17 MED ORDER — DEXAMETHASONE SODIUM PHOSPHATE 10 MG/ML IJ SOLN
INTRAMUSCULAR | Status: DC | PRN
  Administered 2022-02-17: 10 mg via INTRAVENOUS

## 2022-02-17 MED ORDER — OXYCODONE HCL 5 MG/5ML PO SOLN: 5.0000 mg | Freq: Once | ORAL | Status: DC | PRN

## 2022-02-17 MED ORDER — KETOROLAC TROMETHAMINE 30 MG/ML IJ SOLN
INTRAMUSCULAR | Status: AC
  Filled 2022-02-17: qty 1

## 2022-02-17 SURGICAL SUPPLY — 43 items
ADH SKN CLS APL DERMABOND .7 (GAUZE/BANDAGES/DRESSINGS) ×1
APL SRG 38 LTWT LNG FL B (MISCELLANEOUS)
APPLICATOR ARISTA FLEXITIP XL (MISCELLANEOUS) IMPLANT
BAG SPEC RTRVL LRG 6X4 10 (ENDOMECHANICALS) ×1
DERMABOND ADVANCED (GAUZE/BANDAGES/DRESSINGS) ×1
DERMABOND ADVANCED .7 DNX12 (GAUZE/BANDAGES/DRESSINGS) ×1 IMPLANT
DRSG OPSITE POSTOP 3X4 (GAUZE/BANDAGES/DRESSINGS) ×2 IMPLANT
DURAPREP 26ML APPLICATOR (WOUND CARE) ×2 IMPLANT
GLOVE BIOGEL PI IND STRL 7.0 (GLOVE) IMPLANT
GLOVE BIOGEL PI IND STRL 8 (GLOVE) ×1 IMPLANT
GLOVE BIOGEL PI INDICATOR 7.0 (GLOVE) ×1
GLOVE BIOGEL PI INDICATOR 8 (GLOVE) ×1
GLOVE ECLIPSE 7.0 STRL STRAW (GLOVE) ×1 IMPLANT
GLOVE SURG ORTHO 8.0 STRL STRW (GLOVE) ×2 IMPLANT
GOWN STRL REUS W/ TWL LRG LVL3 (GOWN DISPOSABLE) ×1 IMPLANT
GOWN STRL REUS W/ TWL XL LVL3 (GOWN DISPOSABLE) ×1 IMPLANT
GOWN STRL REUS W/TWL LRG LVL3 (GOWN DISPOSABLE) ×2
GOWN STRL REUS W/TWL XL LVL3 (GOWN DISPOSABLE) ×2
HEMOSTAT ARISTA ABSORB 3G PWDR (HEMOSTASIS) IMPLANT
IRRIG SUCT STRYKERFLOW 2 WTIP (MISCELLANEOUS)
IRRIGATION SUCT STRKRFLW 2 WTP (MISCELLANEOUS) IMPLANT
KIT TURNOVER KIT B (KITS) ×2 IMPLANT
LIGASURE VESSEL 5MM BLUNT TIP (ELECTROSURGICAL) ×3 IMPLANT
NS IRRIG 1000ML POUR BTL (IV SOLUTION) ×2 IMPLANT
PACK LAPAROSCOPY BASIN (CUSTOM PROCEDURE TRAY) ×2 IMPLANT
PACK TRENDGUARD 450 HYBRID PRO (MISCELLANEOUS) ×1 IMPLANT
POUCH SPECIMEN RETRIEVAL 10MM (ENDOMECHANICALS) ×1 IMPLANT
PROTECTOR NERVE ULNAR (MISCELLANEOUS) ×3 IMPLANT
SCOPETTES 8  STERILE (MISCELLANEOUS) ×2
SCOPETTES 8 STERILE (MISCELLANEOUS) IMPLANT
SET TUBE SMOKE EVAC HIGH FLOW (TUBING) ×2 IMPLANT
SLEEVE XCEL OPT CAN 5 100 (ENDOMECHANICALS) ×2 IMPLANT
SUT MNCRL AB 4-0 PS2 18 (SUTURE) ×2 IMPLANT
SUT VIC AB 4-0 PS2 18 (SUTURE) ×1 IMPLANT
SUT VICRYL 0 UR6 27IN ABS (SUTURE) ×2 IMPLANT
TOWEL GREEN STERILE FF (TOWEL DISPOSABLE) ×3 IMPLANT
TRAY FOLEY W/BAG SLVR 14FR (SET/KITS/TRAYS/PACK) ×2 IMPLANT
TRENDGUARD 450 HYBRID PRO PACK (MISCELLANEOUS)
TROCAR ADV FIXATION 5X100MM (TROCAR) ×1 IMPLANT
TROCAR XCEL NON-BLD 11X100MML (ENDOMECHANICALS) ×1 IMPLANT
TROCAR XCEL NON-BLD 5MMX100MML (ENDOMECHANICALS) ×2 IMPLANT
TROCAR Z-THREAD FIOS 11X100 BL (TROCAR) ×1 IMPLANT
WARMER LAPAROSCOPE (MISCELLANEOUS) ×2 IMPLANT

## 2022-02-17 NOTE — Op Note (Signed)
Tanya Moore PROCEDURE DATE: 02/17/2022   PREOPERATIVE DIAGNOSIS:  Undesired fertility  POSTOPERATIVE DIAGNOSIS:  Undesired fertility  PROCEDURE:  Laparoscopic leftl Salpingectomy   SURGEON:  Dr. Mariel Aloe  ASSISTANT:  Dr. Tinnie Gens.   An experienced assistant was required given the standard of surgical care given the complexity of the case.  This assistant was needed for exposure, dissection, suctioning, retraction, instrument exchange, and for overall help during the procedure.  ANESTHESIA:  General endotracheal  COMPLICATIONS:  None immediate.  ESTIMATED BLOOD LOSS:  30 ml.  FLUIDS: 400 ml LR.  URINE OUTPUT:  35 ml of clear urine.  INDICATIONS: 41 y.o. [id number] with undesired fertility, desires permanent sterilization. Other reversible forms of contraception were discussed with patient; she declines all other modalities.  Risks of procedure discussed with patient including permanence of method, risk of regret, bleeding, infection, injury to surrounding organs and need for additional procedures including laparotomy.  Failure risk less than 0.5% with increased risk of ectopic gestation if pregnancy occurs was also discussed with patient.  Written informed consent was obtained.    FINDINGS:  Normal uterus, fallopian tube, and ovaries.  Small amount of intra-abdominal adhesions noted.  TECHNIQUE:  The patient was taken to the operating room where general anesthesia was obtained without difficulty.  She was then placed in the dorsal lithotomy position and prepared and draped in sterile fashion.  After an adequate timeout was performed, a bivalved speculum was then placed in the patient's vagina, and the anterior lip of cervix grasped with the single-tooth tenaculum.  The [place] uterine manipulator was then advanced into the uterus and secured.  The speculum was removed from the vagina as was the tenaculum.  Attention was then turned to the patient's abdomen where a 6-mm skin  incision was made in the umbilical fold.  The abdomen was entered with the [facility] technique.  Once  the abdominal cavity was entered, the 5 mm trochar was placed and secured with the balloon. The abdomen was then insufflated with carbon dioxide gas.  Adequate pneumoperitoneum was obtained.  A survey of the patient's pelvis and abdomen revealed the findings above. Bilateral 5-mm lower quadrant ports were then placed under direct visualization.  The left fallopian tube were transected from the uterine attachments and the underlying mesosalpinx with the LigaSure device allowing for left salpingectomy.  The fallopian tube was then removed from the abdomen under direct visualization.  The operative site was surveyed, and it was found to be hemostatic.   No intraoperative injury to other surrounding organs was noted.  The abdomen was desufflated and all instruments were then removed from the patient's abdomen.  All skin incisions were closed with 4-0 Vicryl and Dermabond.  The uterine manipulator was removed from the cervix without complications. The patient tolerated the procedure well. Monsel's solution had to be applied to the tenaculum sites to achieve hemostasis, but it was successful.   Sponge, lap, and needle counts were correct times two.  The patient was then taken to the recovery room awake, extubated and in stable condition.  The patient will be discharged to home as per PACU criteria.  Routine postoperative instructions given.  She was prescribed oxycodone, Ibuprofen.  She will follow up in the clinic in 2-3 weeks for postoperative evaluation.   Mariel Aloe, MD, FACOG Attending Obstetrician & Gynecologist Faculty Practice, [facility] [place]

## 2022-02-17 NOTE — Anesthesia Preprocedure Evaluation (Signed)
Anesthesia Evaluation  Patient identified by MRN, date of birth, ID band Patient awake    Reviewed: Allergy & Precautions, NPO status , Patient's Chart, lab work & pertinent test results  Airway Mallampati: II  TM Distance: >3 FB Neck ROM: Full    Dental no notable dental hx.    Pulmonary neg pulmonary ROS, former smoker,    Pulmonary exam normal breath sounds clear to auscultation       Cardiovascular hypertension, Normal cardiovascular exam Rhythm:Regular Rate:Normal     Neuro/Psych Bipolar Disorder negative neurological ROS     GI/Hepatic negative GI ROS, Neg liver ROS,   Endo/Other  negative endocrine ROS  Renal/GU negative Renal ROS  negative genitourinary   Musculoskeletal negative musculoskeletal ROS (+)   Abdominal   Peds negative pediatric ROS (+)  Hematology negative hematology ROS (+)   Anesthesia Other Findings   Reproductive/Obstetrics negative OB ROS                             Anesthesia Physical Anesthesia Plan  ASA: 2  Anesthesia Plan: General   Post-op Pain Management: Minimal or no pain anticipated   Induction: Intravenous  PONV Risk Score and Plan: 3 and Dexamethasone, Ondansetron, Midazolam and Treatment may vary due to age or medical condition  Airway Management Planned: Oral ETT  Additional Equipment:   Intra-op Plan:   Post-operative Plan: Extubation in OR  Informed Consent: I have reviewed the patients History and Physical, chart, labs and discussed the procedure including the risks, benefits and alternatives for the proposed anesthesia with the patient or authorized representative who has indicated his/her understanding and acceptance.     Dental advisory given  Plan Discussed with: CRNA and Surgeon  Anesthesia Plan Comments:         Anesthesia Quick Evaluation

## 2022-02-17 NOTE — Anesthesia Postprocedure Evaluation (Signed)
Anesthesia Post Note  Patient: Tanya Moore  Procedure(s) Performed: LAPAROSCOPIC LEFT SALPINGECTOMY (Left: Abdomen)     Patient location during evaluation: PACU Anesthesia Type: General Level of consciousness: awake and alert Pain management: pain level controlled Vital Signs Assessment: post-procedure vital signs reviewed and stable Respiratory status: spontaneous breathing, nonlabored ventilation, respiratory function stable and patient connected to nasal cannula oxygen Cardiovascular status: blood pressure returned to baseline and stable Postop Assessment: no apparent nausea or vomiting Anesthetic complications: no   No notable events documented.  Last Vitals:  Vitals:   02/17/22 0915 02/17/22 0922  BP: 114/75 106/70  Pulse: 69 81  Resp: 14 19  Temp:  36.4 C  SpO2: 100% 100%    Last Pain:  Vitals:   02/17/22 0922  PainSc: 0-No pain                 Jiles Goya S

## 2022-02-17 NOTE — Anesthesia Procedure Notes (Signed)
Procedure Name: Intubation Date/Time: 02/17/2022 7:38 AM  Performed by: Minerva Ends, CRNAPre-anesthesia Checklist: Patient identified, Emergency Drugs available, Suction available and Patient being monitored Patient Re-evaluated:Patient Re-evaluated prior to induction Oxygen Delivery Method: Circle system utilized Preoxygenation: Pre-oxygenation with 100% oxygen Induction Type: IV induction Ventilation: Mask ventilation without difficulty Laryngoscope Size: Mac and 3 Grade View: Grade I Tube type: Oral Number of attempts: 1 Airway Equipment and Method: Stylet Placement Confirmation: ETT inserted through vocal cords under direct vision, positive ETCO2 and breath sounds checked- equal and bilateral Secured at: 21 cm Tube secured with: Tape Dental Injury: Teeth and Oropharynx as per pre-operative assessment

## 2022-02-17 NOTE — Addendum Note (Signed)
Addendum  created 02/17/22 1749 by Darryl Nestle, CRNA   Attestation recorded in [place], Intraprocedure Attestations filed

## 2022-02-17 NOTE — Interval H&P Note (Signed)
History and Physical Interval Note:  02/17/2022 7:22 AM  Tanya Moore  has presented today for surgery, with the diagnosis of Undesired Fertility.  The various methods of treatment have been discussed with the patient and family. After consideration of risks, benefits and other options for treatment, the patient has consented to  Procedure(s): LAPAROSCOPIC LEFT SALPINGECTOMY POSSIBLE OPEN (Left) as a surgical intervention.  The patient's history has been reviewed, patient examined, no change in status, stable for surgery.  I have reviewed the patient's chart and labs.  Questions were answered to the patient's satisfaction.     Warden Fillers

## 2022-02-17 NOTE — Transfer of Care (Signed)
Immediate Anesthesia Transfer of Care Note  Patient: Tanya Moore  Procedure(s) Performed: LAPAROSCOPIC LEFT SALPINGECTOMY (Left: Abdomen)  Patient Location: PACU  Anesthesia Type:General  Level of Consciousness: drowsy  Airway & Oxygen Therapy: Patient Spontanous Breathing  Post-op Assessment: Report given to RN, Post -op Vital signs reviewed and stable and Patient moving all extremities X 4  Post vital signs: Reviewed and stable  Last Vitals:  Vitals Value Taken Time  BP 131/78 02/17/22 0857  Temp    Pulse 88 02/17/22 0858  Resp 13 02/17/22 0858  SpO2 100 % 02/17/22 0858  Vitals shown include unvalidated device data.  Last Pain:  Vitals:   02/17/22 0608  PainSc: 0-No pain         Complications: No notable events documented.

## 2022-02-18 ENCOUNTER — Encounter (HOSPITAL_COMMUNITY): Payer: Self-pay | Admitting: Obstetrics and Gynecology

## 2022-02-19 LAB — SURGICAL PATHOLOGY

## 2022-03-11 ENCOUNTER — Telehealth (HOSPITAL_COMMUNITY): Payer: Self-pay | Admitting: *Deleted

## 2022-03-11 NOTE — Telephone Encounter (Signed)
Patient Called Requested Refill: amphetamine-dextroamphetamine (ADDERALL XR) 20 MG 24 hr capsule Take 1 capsule (20 mg total) by mouth daily

## 2022-03-12 ENCOUNTER — Other Ambulatory Visit (HOSPITAL_COMMUNITY): Payer: Self-pay | Admitting: Psychiatry

## 2022-03-12 DIAGNOSIS — F900 Attention-deficit hyperactivity disorder, predominantly inattentive type: Secondary | ICD-10-CM

## 2022-03-12 MED ORDER — AMPHETAMINE-DEXTROAMPHET ER 20 MG PO CP24: 20.0000 mg | ORAL_CAPSULE | Freq: Every day | ORAL | 0 refills | Status: DC

## 2022-03-12 NOTE — Telephone Encounter (Signed)
Medication refilled and sent to preferred pharmacy

## 2022-04-10 ENCOUNTER — Encounter (HOSPITAL_COMMUNITY): Payer: Self-pay | Admitting: Psychiatry

## 2022-04-10 ENCOUNTER — Telehealth (INDEPENDENT_AMBULATORY_CARE_PROVIDER_SITE_OTHER): Payer: Medicaid Other | Admitting: Psychiatry

## 2022-04-10 DIAGNOSIS — F3181 Bipolar II disorder: Secondary | ICD-10-CM | POA: Diagnosis not present

## 2022-04-10 DIAGNOSIS — F900 Attention-deficit hyperactivity disorder, predominantly inattentive type: Secondary | ICD-10-CM | POA: Diagnosis not present

## 2022-04-10 MED ORDER — ARIPIPRAZOLE 10 MG PO TABS: 10.0000 mg | ORAL_TABLET | Freq: Every day | ORAL | 3 refills | Status: DC

## 2022-04-10 MED ORDER — SERTRALINE HCL 100 MG PO TABS: 100.0000 mg | ORAL_TABLET | Freq: Every day | ORAL | 3 refills | Status: DC

## 2022-04-10 MED ORDER — AMPHETAMINE-DEXTROAMPHET ER 20 MG PO CP24
20.0000 mg | ORAL_CAPSULE | Freq: Every day | ORAL | 0 refills | Status: DC
Start: 2022-04-10 — End: 2022-05-15

## 2022-04-10 NOTE — Progress Notes (Signed)
[place] MD/PA/NP OP Progress Note Virtual Visit via Telephone Note  I connected with Tanya Moore on 04/10/22 at  8:30 AM EDT by telephone and verified that I am speaking with the correct person using two identifiers.  Location: Patient: home Provider: Clinic   I discussed the limitations, risks, security and privacy concerns of performing an evaluation and management service by telephone and the availability of in person appointments. I also discussed with the patient that there may be a patient responsible charge related to this service. The patient expressed understanding and agreed to proceed.   I provided 30 minutes of non-face-to-face time during this encounter.    04/10/2022 8:53 AM Tanya Moore  MRN:  [id number]  Chief Complaint: "I have been so tired and having anxiety issues".   HPI: 41 year old female seen today for follow up psychiatric evaluation. She has a psychiatric history of ADHD, Bipolar 2, anxiety, and depression. She is currently managed on Adderall XR 20 mg daily, Zoloft 100 mg daily, and Abilify 10 mg daily.She reports her medications are effective in managing her psychiatric conditions.  Today patient's visit was conducted over the phone.  During exam she was pleasant, cooperative and engaged in conversation.  She informed Probation officer that she has been tired and having issues with her anxiety.  she notes that her son is still adjusting to first grade and cries every morning.  He informed Probation officer that she has been sleeping 11 hours nightly.  Patient notes that when anxious she clenches her teeth and muscles.  At times she notes that she has bodily aches.  Today provider conducted a GAD-7 and patient scored a 12, at her last visit she scored a 6.  Provider also conducted PHQ-9 the patient for 12, at her last visit she scored a 4.  She endorses adequate appetite.  Today she denies SI/HI/VH, mania, paranoia   At this time patient notes that she is able to manage life stressors.   No medication changes made today.  Patient agreeable to continue medications as prescribed.  No other concerns noted at this time.  Visit Diagnosis:    ICD-10-CM   1. Bipolar 2 disorder, major depressive episode (HCC)  F31.81 sertraline (ZOLOFT) 100 MG tablet    ARIPiprazole (ABILIFY) 10 MG tablet    2. Attention deficit hyperactivity disorder (ADHD), predominantly inattentive type  F90.0 amphetamine-dextroamphetamine (ADDERALL XR) 20 MG 24 hr capsule      Past Psychiatric History: History of anxiety, was diagnosed with bipolar disorder back in 2016.  Has been tried on several her medications.  Was being seen by PCP followed by [facility].  Stopped taking medications after she found out she is pregnant in 2020.  Past Medical History:  Past Medical History:  Diagnosis Date   ADHD    Allergy    Anxiety    Bipolar 1 disorder ([place])    Depression    Ectopic pregnancy with intrauterine pregnancy/Heterotopic Pregnancy 05/03/2019   S/p laparoscopic right salpingectomy on 05/03/19 05/03/19 SURGICAL PATHOLOGY FALLOPIAN TUBE WITH ECTOPIC PREGNANCY, RIGHT, SALPINGECTOMY:  - Benign fallopian tube with chorionic villi consistent with ectopic pregnancy.    Headache    Hemoperitoneum due to rupture of right tubal ectopic pregnancy 05/03/2019   History of ectopic pregnancy in 11/2018 11/20/2018   Mtx#1 given 4/3: Hcg day 0 (147), day 4 (180), day 7 (193) Mtx # 2 given 4/9 (193)->4/12 (153)->4/15 (79)   Hyperlipidemia    Infection    UTI   Pregnancy  induced hypertension    Psoriasis 2020    Past Surgical History:  Procedure Laterality Date   APPENDECTOMY     BUNIONECTOMY     CESAREAN SECTION  2017   CESAREAN SECTION N/A 11/27/2019   Procedure: CESAREAN SECTION AND IUD INSERTION;  Surgeon: Hermina Staggers, MD;  Location: [facility] LD ORS;  Service: Obstetrics;  Laterality: N/A;   DIAGNOSTIC LAPAROSCOPY WITH REMOVAL OF ECTOPIC PREGNANCY N/A 05/03/2019   Procedure: RIGHT SALPINGECTOMY AND REMOVAL OF ECTOPIC  PREGNANCY;  Surgeon: Tereso Newcomer, MD;  Location: [facility] OR;  Service: Gynecology;  Laterality: N/A;   LAPAROSCOPIC BILATERAL SALPINGECTOMY Left 02/17/2022   Procedure: LAPAROSCOPIC LEFT SALPINGECTOMY;  Surgeon: Warden Fillers, MD;  [facility];  Service: Gynecology;  Laterality: Left;   TONSILLECTOMY      Family Psychiatric History: Sister depression   Family History:  Family History  Problem Relation Age of Onset   Hypertension Mother    Hypertension Father    Cancer Father        prostate   Depression Sister     Social History:  Social History   Socioeconomic History   Marital status: Significant Other    Spouse name: Not on file   Number of children: 2   Years of education: Not on file   Highest education level: Not on file  Occupational History   Not on file  Tobacco Use   Smoking status: Former    Types: Cigarettes    Quit date: 2017    Years since quitting: 6.6   Smokeless tobacco: Never  Vaping Use   Vaping Use: Some days  Substance and Sexual Activity   Alcohol use: No   Drug use: No   Sexual activity: Not Currently    Birth control/protection: None  Other Topics Concern   Not on file  Social History Narrative   Not on file   Social Determinants of Health   Financial Resource Strain: Not on file  Food Insecurity: Food Insecurity Present (02/12/2022)   Hunger Vital Sign    Worried About Running Out of Food in the Last Year: Sometimes true    Ran Out of Food in the Last Year: Never true  Transportation Needs: No Transportation Needs (02/12/2022)   PRAPARE - Administrator, Civil Service (Medical): No    Lack of Transportation (Non-Medical): No  Physical Activity: Not on file  Stress: Not on file  Social Connections: Not on file    Allergies:  Allergies  Allergen Reactions   Lamictal [Lamotrigine] Rash    Patient reported    Metabolic Disorder Labs: Lab Results  Component Value Date   HGBA1C 4.9 05/26/2019   No results  found for: "PROLACTIN" Lab Results  Component Value Date   CHOL 272 (H) 08/12/2021   TRIG 193 (H) 08/12/2021   HDL 49 08/12/2021   CHOLHDL 5.6 (H) 08/12/2021   LDLCALC 187 (H) 08/12/2021   LDLCALC 228 (H) 06/05/2020   No results found for: "TSH"  Therapeutic Level Labs: No results found for: "LITHIUM" No results found for: "VALPROATE" No results found for: "CBMZ"  Current Medications: Current Outpatient Medications  Medication Sig Dispense Refill   acetaminophen (TYLENOL) 325 MG tablet Take 2 tablets (650 mg total) by mouth every 6 (six) hours as needed. (Patient not taking: Reported on 12/29/2021) 30 tablet 0   amphetamine-dextroamphetamine (ADDERALL XR) 20 MG 24 hr capsule Take 1 capsule (20 mg total) by mouth daily. 30 capsule 0  ARIPiprazole (ABILIFY) 10 MG tablet Take 1 tablet (10 mg total) by mouth daily. 30 tablet 3   atorvastatin (LIPITOR) 20 MG tablet Take 1 tablet (20 mg total) by mouth daily. 90 tablet 3   ibuprofen (ADVIL) 600 MG tablet Take 1 tablet (600 mg total) by mouth every 6 (six) hours as needed for headache, mild pain, moderate pain or cramping. 30 tablet 2   oxyCODONE (ROXICODONE) 5 MG immediate release tablet Take 1 tablet (5 mg total) by mouth every 4 (four) hours as needed for severe pain. 24 tablet 0   sertraline (ZOLOFT) 100 MG tablet Take 1 tablet (100 mg total) by mouth daily with breakfast. 30 tablet 3   No current facility-administered medications for this visit.     Musculoskeletal: Strength & Muscle Tone:  Unable to assess due to telephone visit Gait & Station:  Unable to assess due to telephone visit Patient leans: N/A  Psychiatric Specialty Exam: Review of Systems  There were no vitals taken for this visit.There is no height or weight on file to calculate BMI.  General Appearance:  Unable to assess due to telephone visit  Eye Contact:   Unable to assess due to telephone visit  Speech:  Clear and Coherent and Normal Rate  Volume:  Normal   Mood:  Euthymic, reports mild anxiety/depression, reports can cope well but  Affect:  Appropriate and Congruent  Thought Process:  Coherent, Goal Directed, and Linear  Orientation:  Full (Time, Place, and Person)  Thought Content: WDL and Logical   Suicidal Thoughts:  No  Homicidal Thoughts:  No  Memory:  Immediate;   Good Recent;   Good Remote;   Good  Judgement:  Good  Insight:  Good  Psychomotor Activity:   Unable to assess due to telephone visit  Concentration:  Concentration: Fair and Attention Span: Fair  Recall:  Good  Fund of Knowledge: Good  Language: Good  Akathisia:   Unable to assess due to telephone visit  Handed:  Right  AIMS (if indicated): not done  Assets:  Communication Skills Desire for Improvement Financial Resources/Insurance Housing Intimacy Leisure Time Physical Health Social Support  ADL's:  Intact  Cognition: WNL  Sleep:  Good   Screenings: GAD-7    Flowsheet Row Video Visit from 04/10/2022 in [facility] Office Visit from 02/12/2022 in [facility] Video Visit from 01/09/2022 in [facility] Office Visit from 12/29/2021 in [facility] for [facility] Lab from 10/23/2021 in [facility] at [facility]  Total GAD-7 Score 12 6 11 7 11       PHQ2-9    Flowsheet Row Video Visit from 04/10/2022 in [facility] Office Visit from 02/12/2022 in [facility] Video Visit from 01/09/2022 in [facility] Office Visit from 12/29/2021 in [facility] Lab from 10/23/2021 in [facility] at [facility] for Women  PHQ-2 Total Score 3 2 3 2 2   PHQ-9 Total Score 12 4 8 5 2       Flowsheet Row Admission (Discharged)  from 02/17/2022 in [place] PERIOPERATIVE AREA Pre-Admission Testing 60 from 02/09/2022 in [facility] PREADMISSION TESTING Admission (Discharged) from 10/21/2021 in [facility] 1S Maternity Assessment Unit  C-SSRS RISK CATEGORY No Risk No  Risk No Risk        Assessment and Plan: Patient endorses mild anxiety and depression but notes that she is able to cope with it.At this time patient notes that she is able to manage life stressors.  No medication changes made today.  Patient agreeable to continue medications as prescribed.  1. Bipolar 2 disorder, major depressive episode (HCC)  Continue- ARIPiprazole (ABILIFY) 10 MG tablet; Take 1 tablet (10 mg total) by mouth daily.  Dispense: 30 tablet; Refill: 3 Continue- sertraline (ZOLOFT) 100 MG tablet; Take 1 tablet (100 mg total) by mouth daily with breakfast.  Dispense: 30 tablet; Refill: 3  2. Attention deficit hyperactivity disorder (ADHD), predominantly inattentive type  Continue- amphetamine-dextroamphetamine (ADDERALL XR) 20 MG 24 hr capsule; Take 1 capsule (20 mg total) by mouth daily.  Dispense: 30 capsule; Refill: 0   Follow up in 3 months   Salley Slaughter, NP 04/10/2022, 8:53 AM

## 2022-05-15 ENCOUNTER — Telehealth (HOSPITAL_COMMUNITY): Payer: Self-pay

## 2022-05-15 ENCOUNTER — Other Ambulatory Visit (HOSPITAL_COMMUNITY): Payer: Self-pay | Admitting: Psychiatry

## 2022-05-15 DIAGNOSIS — F900 Attention-deficit hyperactivity disorder, predominantly inattentive type: Secondary | ICD-10-CM

## 2022-05-15 MED ORDER — AMPHETAMINE-DEXTROAMPHET ER 20 MG PO CP24: 20.0000 mg | ORAL_CAPSULE | Freq: Every day | ORAL | 0 refills | Status: DC

## 2022-05-15 NOTE — Telephone Encounter (Signed)
Medication refilled and sent to preferred pharmacy

## 2022-05-15 NOTE — Telephone Encounter (Signed)
Patient Called Refill Request -- amphetamine-dextroamphetamine (ADDERALL XR) 20 MG 24 hr capsule Take 1 capsule (20 mg total) by mouth daily

## 2022-07-01 ENCOUNTER — Encounter (HOSPITAL_COMMUNITY): Payer: Self-pay | Admitting: Psychiatry

## 2022-07-01 ENCOUNTER — Telehealth (HOSPITAL_COMMUNITY): Payer: Medicaid Other | Admitting: Psychiatry

## 2022-07-01 DIAGNOSIS — F3181 Bipolar II disorder: Secondary | ICD-10-CM | POA: Diagnosis not present

## 2022-07-01 DIAGNOSIS — F900 Attention-deficit hyperactivity disorder, predominantly inattentive type: Secondary | ICD-10-CM

## 2022-07-01 MED ORDER — SERTRALINE HCL 50 MG PO TABS
150.0000 mg | ORAL_TABLET | Freq: Every day | ORAL | 3 refills | Status: DC
Start: 2022-07-01 — End: 2022-07-15

## 2022-07-01 MED ORDER — ARIPIPRAZOLE 15 MG PO TABS: 15.0000 mg | ORAL_TABLET | Freq: Every day | ORAL | 3 refills | Status: DC

## 2022-07-01 MED ORDER — AMPHETAMINE-DEXTROAMPHET ER 20 MG PO CP24
20.0000 mg | ORAL_CAPSULE | Freq: Every day | ORAL | 0 refills | Status: DC
Start: 2022-07-01 — End: 2022-08-17

## 2022-07-01 NOTE — Progress Notes (Signed)
BH MD/PA/NP OP Progress Note Virtual Visit via Telephone Note  I connected with Tanya Moore on 07/01/22 at  8:30 AM EST by telephone and verified that I am speaking with the correct person using two identifiers.  Location: Patient: home Provider: Clinic   I discussed the limitations, risks, security and privacy concerns of performing an evaluation and management service by telephone and the availability of in person appointments. I also discussed with the patient that there may be a patient responsible charge related to this service. The patient expressed understanding and agreed to proceed.   I provided 30 minutes of non-face-to-face time during this encounter.     07/01/2022 8:37 AM Marcelle OverlieHeather D Bour  MRN:  [id number]  Chief Complaint: "My life is different. I feel overwhelmed".   HPI: 41 year old female seen today for follow up psychiatric evaluation. She has a psychiatric history of ADHD, Bipolar 2, anxiety, and depression. She is currently managed on Adderall XR 20 mg daily, Zoloft 100 mg daily, and Abilify 10 mg daily.She reports her medications are somewhat effective in managing her psychiatric conditions.  Today she was well groomed, pleasant, cooperative and engaged in conversation.  She informed Clinical research associatewriter that her life is different and notes that she feels overwhelmed.  She reports that she and her partner are splitting up.  She notes that over the Thanksgiving holiday she will be moving out.  Patient notes that her children will be staying with her ex.  She informed Clinical research associatewriter that this is her anxiety because she fears that her children will hate her.  Currently she reports that her children are sad.  Today provider conducted a GAD-7 and patient scored a 19, at her last visit she scored a 12.  Provider also conducted PHQ-9 patient scored a 17, at her last visit she scored a 12.  She endorses hypersomnia noting that she sleeps 11 hours or more.  She reports having adequate appetite.   Today she denies SI/HI/VH, mania, paranoia.    Patient informed Clinical research associatewriter that recently she has been having headaches.  At this time she notes that she is on medication for this.    Patient informed Clinical research associatewriter that she has been working at [employer] ast few months.  She reports that she finds enjoyment in her job.  Today she is agreeable to increasing Abilify 10 mg to 15 mg to help manage mood.  She is also agreeable to increasing Zoloft 100 mg to 150 mg to help with anxiety and depression.  She will continue Adderall as prescribed.  No other concerns noted at this time.  Visit Diagnosis:    ICD-10-CM   1. Attention deficit hyperactivity disorder (ADHD), predominantly inattentive type  F90.0 amphetamine-dextroamphetamine (ADDERALL XR) 20 MG 24 hr capsule    2. Bipolar 2 disorder, major depressive episode (HCC)  F31.81 ARIPiprazole (ABILIFY) 15 MG tablet    sertraline (ZOLOFT) 50 MG tablet      Past Psychiatric History: History of anxiety, was diagnosed with bipolar disorder back in 2016.  Has been tried on several her medications.  [facility] being [facility]  Stopped taking medications after she found out she is pregnant in 2020.  Past Medical History:  Past Medical History:  Diagnosis Date   ADHD    Allergy    Anxiety    Bipolar 1 disorder (HCC)    Depression    Ectopic pregnancy with intrauterine pregnancy/Heterotopic Pregnancy 05/03/2019   S/p laparoscopic right salpingectomy on 05/03/19 05/03/19  SURGICAL PATHOLOGY FALLOPIAN TUBE WITH ECTOPIC PREGNANCY, RIGHT, SALPINGECTOMY:  - Benign fallopian tube with chorionic villi consistent with ectopic pregnancy.    Headache    Hemoperitoneum due to rupture of right tubal ectopic pregnancy 05/03/2019   History of ectopic pregnancy in 11/2018 11/20/2018   Mtx#1 given 4/3: Hcg day 0 (147), day 4 (180), day 7 (193) Mtx # 2 given 4/9 (193)->4/12 (153)->4/15 (79)   Hyperlipidemia    Infection    UTI   Pregnancy induced hypertension     Psoriasis 2020    Past Surgical History:  Procedure Laterality Date   APPENDECTOMY     BUNIONECTOMY     CESAREAN SECTION  2017   CESAREAN SECTION N/A 11/27/2019   Procedure: CESAREAN SECTION AND IUD INSERTION;  Surgeon: Hermina Staggers, MD;  Location: [facility] LD ORS;  Service: Obstetrics;  Laterality: N/A;   DIAGNOSTIC LAPAROSCOPY WITH REMOVAL OF ECTOPIC PREGNANCY N/A 05/03/2019   Procedure: RIGHT SALPINGECTOMY AND REMOVAL OF ECTOPIC PREGNANCY;  Surgeon: Tereso Newcomer, MD;  Location: [facility] OR;  Service: Gynecology;  Laterality: N/A;   LAPAROSCOPIC BILATERAL SALPINGECTOMY Left 02/17/2022   Procedure: LAPAROSCOPIC LEFT SALPINGECTOMY;  Surgeon: Warden Fillers, MD;  Location: [facility] OR;  Service: Gynecology;  Laterality: Left;   TONSILLECTOMY      Family Psychiatric History: Sister depression   Family History:  Family History  Problem Relation Age of Onset   Hypertension Mother    Hypertension Father    Cancer Father        prostate   Depression Sister     Social History:  Social History   Socioeconomic History   Marital status: Single    Spouse name: Not on file   Number of children: 2   Years of education: Not on file   Highest education level: Not on file  Occupational History   Not on file  Tobacco Use   Smoking status: Former    Types: Cigarettes    Quit date: 2017    Years since quitting: 6.8   Smokeless tobacco: Never  Vaping Use   Vaping Use: Some days  Substance and Sexual Activity   Alcohol use: No   Drug use: No   Sexual activity: Not Currently    Birth control/protection: None  Other Topics Concern   Not on file  Social History Narrative   Not on file   Social Determinants of Health   Financial Resource Strain: Not on file  Food Insecurity: Food Insecurity Present (02/12/2022)   Hunger Vital Sign    Worried About Running Out of Food in the Last Year: Sometimes true    Ran Out of Food in the Last Year: Never true  Transportation Needs: No Transportation  Needs (02/12/2022)   PRAPARE - Administrator, Civil Service (Medical): No    Lack of Transportation (Non-Medical): No  Physical Activity: Not on file  Stress: Not on file  Social Connections: Not on file    Allergies:  Allergies  Allergen Reactions   Lamictal [Lamotrigine] Rash    Patient reported    Metabolic Disorder Labs: Lab Results  Component Value Date   HGBA1C 4.9 05/26/2019   No results found for: "PROLACTIN" Lab Results  Component Value Date   CHOL 272 (H) 08/12/2021   TRIG 193 (H) 08/12/2021   HDL 49 08/12/2021   CHOLHDL 5.6 (H) 08/12/2021   LDLCALC 187 (H) 08/12/2021   LDLCALC 228 (H) 06/05/2020   No results found for: "TSH"  Therapeutic Level Labs: No results found for: "LITHIUM" No results found for: "VALPROATE" No results found for: "CBMZ"  Current Medications: Current Outpatient Medications  Medication Sig Dispense Refill   acetaminophen (TYLENOL) 325 MG tablet Take 2 tablets (650 mg total) by mouth every 6 (six) hours as needed. (Patient not taking: Reported on 12/29/2021) 30 tablet 0   amphetamine-dextroamphetamine (ADDERALL XR) 20 MG 24 hr capsule Take 1 capsule (20 mg total) by mouth daily. 30 capsule 0   ARIPiprazole (ABILIFY) 15 MG tablet Take 1 tablet (15 mg total) by mouth daily. 30 tablet 3   atorvastatin (LIPITOR) 20 MG tablet Take 1 tablet (20 mg total) by mouth daily. 90 tablet 3   ibuprofen (ADVIL) 600 MG tablet Take 1 tablet (600 mg total) by mouth every 6 (six) hours as needed for headache, mild pain, moderate pain or cramping. 30 tablet 2   oxyCODONE (ROXICODONE) 5 MG immediate release tablet Take 1 tablet (5 mg total) by mouth every 4 (four) hours as needed for severe pain. 24 tablet 0   sertraline (ZOLOFT) 50 MG tablet Take 3 tablets (150 mg total) by mouth daily with breakfast. 90 tablet 3   No current facility-administered medications for this visit.     Musculoskeletal: Strength & Muscle Tone: within normal limits and  telehealth visit Gait & Station: normal, telehealth visit Patient leans: N/A  Psychiatric Specialty Exam: Review of Systems  There were no vitals taken for this visit.There is no height or weight on file to calculate BMI.  General Appearance: Well Groomed  Eye Contact:  Good  Speech:  Clear and Coherent and Normal Rate  Volume:  Normal  Mood:  Anxious and Depressed  Affect:  Appropriate and Congruent  Thought Process:  Coherent, Goal Directed, and Linear  Orientation:  Full (Time, Place, and Person)  Thought Content: WDL and Logical   Suicidal Thoughts:  No  Homicidal Thoughts:  No  Memory:  Immediate;   Good Recent;   Good Remote;   Good  Judgement:  Good  Insight:  Good  Psychomotor Activity:  Normal  Concentration:  Concentration: Fair and Attention Span: Fair  Recall:  Good  Fund of Knowledge: Good  Language: Good  Akathisia:  No  Handed:  Right  AIMS (if indicated): not done  Assets:  Communication Skills Desire for Improvement Financial Resources/Insurance Housing Intimacy Leisure Time Physical Health Social Support  ADL's:  Intact  Cognition: WNL  Sleep:  Fair   Screenings: GAD-7    Flowsheet Row Video Visit from 07/01/2022 in [facility] Video Visit from 04/10/2022 in [facility] Office Visit from 02/12/2022 in [facility] Video Visit from 01/09/2022 in [facility] Office Visit from 12/29/2021 in [facility]  Total GAD-7 Score 19 12 6 11 7       PHQ2-9    Flowsheet Row Video Visit from 07/01/2022 in [facility] Video Visit from 04/10/2022 in [facility] Office Visit from 02/12/2022 in [facility] Video Visit from 01/09/2022 in [facility]  Office Visit from 12/29/2021 in [facility]  PHQ-2 Total Score 6 3 2 3 2   PHQ-9 Total Score 17 12 4 8 5       Flowsheet Row Admission (Discharged) from  02/17/2022 in [place] PERIOPERATIVE AREA Pre-Admission Testing 60 from 02/09/2022 in [facility] PREADMISSION TESTING Admission (Discharged) from 10/21/2021 in [facility] 1S Maternity Assessment Unit  C-SSRS RISK CATEGORY No Risk No Risk No Risk        Assessment and Plan: Patient endorses hypersomnia, anxiety, and depression. Today she is agreeable to increasing Abilify 10 mg to 15 mg to help manage mood.  She is also agreeable to increasing Zoloft 100 mg to 150 mg to help with anxiety and depression.  She will continue Adderall as prescribed.    1. Attention deficit hyperactivity disorder (ADHD), predominantly inattentive type  Continue- amphetamine-dextroamphetamine (ADDERALL XR) 20 MG 24 hr capsule; Take 1 capsule (20 mg total) by mouth daily.  Dispense: 30 capsule; Refill: 0  2. Bipolar 2 disorder, major depressive episode (HCC)  Increased- ARIPiprazole (ABILIFY) 15 MG tablet; Take 1 tablet (15 mg total) by mouth daily.  Dispense: 30 tablet; Refill: 3 Increased- sertraline (ZOLOFT) 50 MG tablet; Take 3 tablets (150 mg total) by mouth daily with breakfast.  Dispense: 90 tablet; Refill: 3  Follow up in 2 months   Shanna Cisco, NP 07/01/2022, 8:37 AM

## 2022-07-03 ENCOUNTER — Telehealth (HOSPITAL_COMMUNITY): Payer: Medicaid Other | Admitting: Psychiatry

## 2022-07-15 ENCOUNTER — Telehealth (HOSPITAL_COMMUNITY): Payer: Self-pay | Admitting: *Deleted

## 2022-07-15 ENCOUNTER — Other Ambulatory Visit (HOSPITAL_COMMUNITY): Payer: Self-pay | Admitting: Psychiatry

## 2022-07-15 DIAGNOSIS — F3181 Bipolar II disorder: Secondary | ICD-10-CM

## 2022-07-15 MED ORDER — SERTRALINE HCL 50 MG PO TABS: 150.0000 mg | ORAL_TABLET | Freq: Every day | ORAL | 3 refills | Status: DC

## 2022-07-15 NOTE — Telephone Encounter (Signed)
Medication refilled and sent to preferred pharmacy

## 2022-07-15 NOTE — Telephone Encounter (Signed)
Rx Request Stated Patients Insurance wouldn't cover Quantity #90 for sertraline (ZOLOFT) 50 MG tablet & Suggested if you send a script for   sertraline (ZOLOFT) 100 MG tablet Quantity #45  1.5 tablet Q Day

## 2022-07-20 ENCOUNTER — Other Ambulatory Visit (HOSPITAL_COMMUNITY): Payer: Self-pay | Admitting: Psychiatry

## 2022-07-20 ENCOUNTER — Telehealth (HOSPITAL_COMMUNITY): Payer: Self-pay

## 2022-07-20 DIAGNOSIS — F3181 Bipolar II disorder: Secondary | ICD-10-CM

## 2022-07-20 MED ORDER — SERTRALINE HCL 100 MG PO TABS: 150.0000 mg | ORAL_TABLET | Freq: Every day | ORAL | 3 refills | Status: DC

## 2022-07-20 NOTE — Telephone Encounter (Signed)
Medication problem - Spoke to [employer], pharmacist at [facility], after she left a message that patient's insurance would not pay for Sertraline 50 mg, 3 a day but would cover 100 mg dosage, 1.5 tablets per day, #45. Agreed to send message to Dr. Doyne Keel to request she resend this prescription for Sertraline 100 mg, 1.5 tablets per day, #45 with refills to take the place of the 50 mg, 3 a day that would not be covered by her insurance.

## 2022-07-20 NOTE — Telephone Encounter (Signed)
Order rewritten for Zoloft 150  mg # 45 (1.5 tablets) sent to preferred pharmacy.

## 2022-08-12 ENCOUNTER — Telehealth (HOSPITAL_COMMUNITY): Payer: Self-pay | Admitting: *Deleted

## 2022-08-12 NOTE — Telephone Encounter (Signed)
Fax received for approval of Aripiprazole 15mg  from 08/12/22-08/12/23. Authorization number [id number].

## 2022-08-13 ENCOUNTER — Telehealth (HOSPITAL_COMMUNITY): Payer: Self-pay | Admitting: *Deleted

## 2022-08-13 NOTE — Telephone Encounter (Signed)
PA for patients Aripiprazole approved and [place] notified.

## 2022-08-14 ENCOUNTER — Telehealth (HOSPITAL_COMMUNITY): Payer: Self-pay | Admitting: *Deleted

## 2022-08-14 NOTE — Telephone Encounter (Signed)
Patient LVM requesting refill -- amphetamine-dextroamphetamine (ADDERALL XR) 20 MG 24 hr capsule   [facility]   [address] [place], [place], [place] [address]

## 2022-08-17 ENCOUNTER — Other Ambulatory Visit (HOSPITAL_COMMUNITY): Payer: Self-pay | Admitting: Psychiatry

## 2022-08-17 DIAGNOSIS — F900 Attention-deficit hyperactivity disorder, predominantly inattentive type: Secondary | ICD-10-CM

## 2022-08-17 MED ORDER — AMPHETAMINE-DEXTROAMPHET ER 20 MG PO CP24: 20.0000 mg | ORAL_CAPSULE | Freq: Every day | ORAL | 0 refills | Status: DC

## 2022-08-17 NOTE — Telephone Encounter (Signed)
Medication refilled and sent to preferred pharmacy

## 2022-08-27 ENCOUNTER — Telehealth (INDEPENDENT_AMBULATORY_CARE_PROVIDER_SITE_OTHER): Payer: Medicaid Other | Admitting: Psychiatry

## 2022-08-27 ENCOUNTER — Encounter (HOSPITAL_COMMUNITY): Payer: Self-pay | Admitting: Psychiatry

## 2022-08-27 DIAGNOSIS — F3181 Bipolar II disorder: Secondary | ICD-10-CM

## 2022-08-27 DIAGNOSIS — F900 Attention-deficit hyperactivity disorder, predominantly inattentive type: Secondary | ICD-10-CM

## 2022-08-27 MED ORDER — SERTRALINE HCL 100 MG PO TABS: 150.0000 mg | ORAL_TABLET | Freq: Every day | ORAL | 3 refills | Status: DC

## 2022-08-27 MED ORDER — AMPHETAMINE-DEXTROAMPHET ER 20 MG PO CP24: 20.0000 mg | ORAL_CAPSULE | Freq: Every day | ORAL | 0 refills | Status: DC

## 2022-08-27 MED ORDER — ARIPIPRAZOLE 15 MG PO TABS: 15.0000 mg | ORAL_TABLET | Freq: Every day | ORAL | 3 refills | Status: DC

## 2022-08-27 NOTE — Progress Notes (Signed)
BH MD/PA/NP OP Progress Note Virtual Visit via Video Note  I connected with Vennesa Bastedo Spratley on 08/27/22 at  8:30 AM EST by a video enabled telemedicine application and verified that I am speaking with the correct person using two identifiers.  Location: Patient: Home Provider: Clinic   I discussed the limitations of evaluation and management by telemedicine and the availability of in person appointments. The patient expressed understanding and agreed to proceed.  I provided 30 minutes of non-face-to-face time during this encounter.       08/27/2022 9:17 AM MOSETTA FERDINAND  MRN:  [id number]  Chief Complaint: "I am worried about my children and finances".   HPI: 42 year old female seen today for follow up psychiatric evaluation. She has a psychiatric history of ADHD, Bipolar 2, anxiety, and depression. She is currently managed on Adderall XR 20 mg daily, Zoloft 100 mg daily, and Abilify 10 mg daily.She reports her medications are somewhat effective in managing her psychiatric conditions.  Today she was well groomed, pleasant, cooperative and engaged in conversation.  She informed Clinical research associate that she has been worried about her children and finances. She reports that she has moved out of her exs home and now sees her children infrequently. She reports that she fears that they will hate her. She also notes that she also fears that she will not be able to support herself financially. Patient also reports that she has been stressed due to car issues. She notes that her car had a flat tire and it took awhile for it to get repaired. She reports that she has been tired.   Patient notes that the above exacerbates her anxiety and depression.  Today provider conducted a GAD-7 and patient scored a 17, at her last visit she scored a 19.  Provider also conducted PHQ-9 patient scored a 13, at her last visit she scored a 17.  She endorses adequate sleep and appetite.  Today she denies SI/HI/VAH, mania, paranoia.     Patient has not had labs drawn in over 6 months. Provider ordered UDS, CBC, THS, LFT, Hgb A1C, and BMP. No medication changes made today. Patient agreeable to continue medications as prescribed.  No other concerns noted at this time.  Visit Diagnosis:    ICD-10-CM   1. Bipolar 2 disorder, major depressive episode (HCC)  F31.81 ARIPiprazole (ABILIFY) 15 MG tablet    sertraline (ZOLOFT) 100 MG tablet    CBC w/Diff/Platelet    Basic Metabolic Panel (BMET)    Hepatic function panel    Thyroid Panel With TSH    HgB A1c    2. Attention deficit hyperactivity disorder (ADHD), predominantly inattentive type  F90.0 amphetamine-dextroamphetamine (ADDERALL XR) 20 MG 24 hr capsule    Urine Drug Panel 7      Past Psychiatric History: History of anxiety, was diagnosed with bipolar disorder back in 2016.  Has been tried on several her medications.  Was being seen by PCP followed by [facility].  Stopped taking medications after she found out she is pregnant in 2020.  Past Medical History:  Past Medical History:  Diagnosis Date   ADHD    Allergy    Anxiety    Bipolar 1 disorder (HCC)    Depression    Ectopic pregnancy with intrauterine pregnancy/Heterotopic Pregnancy 05/03/2019   S/p laparoscopic right salpingectomy on 05/03/19 05/03/19 SURGICAL PATHOLOGY FALLOPIAN TUBE WITH ECTOPIC PREGNANCY, RIGHT, SALPINGECTOMY:  - Benign fallopian tube with chorionic villi consistent with ectopic pregnancy.    Headache  Hemoperitoneum due to rupture of right tubal ectopic pregnancy 05/03/2019   History of ectopic pregnancy in 11/2018 11/20/2018   Mtx#1 given 4/3: Hcg day 0 (147), day 4 (180), day 7 (193) Mtx # 2 given 4/9 (193)->4/12 (153)->4/15 (79)   Hyperlipidemia    Infection    UTI   Pregnancy induced hypertension    Psoriasis 2020    Past Surgical History:  Procedure Laterality Date   APPENDECTOMY     BUNIONECTOMY     CESAREAN SECTION  2017   CESAREAN SECTION N/A 11/27/2019   Procedure: CESAREAN  SECTION AND IUD INSERTION;  Surgeon: Chancy Milroy, MD;  Location: [place] LD ORS;  Service: Obstetrics;  Laterality: N/A;   DIAGNOSTIC LAPAROSCOPY WITH REMOVAL OF ECTOPIC PREGNANCY N/A 05/03/2019   Procedure: RIGHT SALPINGECTOMY AND REMOVAL OF ECTOPIC PREGNANCY;  Surgeon: Osborne Oman, MD;  Location: [place];  Service: Gynecology;  Laterality: N/A;   LAPAROSCOPIC BILATERAL SALPINGECTOMY Left 02/17/2022   Procedure: LAPAROSCOPIC LEFT SALPINGECTOMY;  Surgeon: Griffin Basil, MD;  Location: [place];  Service: Gynecology;  Laterality: Left;   TONSILLECTOMY      Family Psychiatric History: Sister depression   Family History:  Family History  Problem Relation Age of Onset   Hypertension Mother    Hypertension Father    Cancer Father        prostate   Depression Sister     Social History:  Social History   Socioeconomic History   Marital status: Single    Spouse name: Not on file   Number of children: 2   Years of education: Not on file   Highest education level: Not on file  Occupational History   Not on file  Tobacco Use   Smoking status: Former    Types: Cigarettes    Quit date: 2017    Years since quitting: 7.0   Smokeless tobacco: Never  Vaping Use   Vaping Use: Some days  Substance and Sexual Activity   Alcohol use: No   Drug use: No   Sexual activity: Not Currently    Birth control/protection: None  Other Topics Concern   Not on file  Social History Narrative   Not on file   Social Determinants of Health   Financial Resource Strain: Not on file  Food Insecurity: Food Insecurity Present (02/12/2022)   Hunger Vital Sign    Worried About [place] in the Last Year: Sometimes true    Ran Out of Food in the Last Year: Never true  Transportation Needs: No Transportation Needs (02/12/2022)   PRAPARE - Hydrologist (Medical): No    Lack of Transportation (Non-Medical): No  Physical Activity: Not on file  Stress: Not on file   Social Connections: Not on file    Allergies:  Allergies  Allergen Reactions   Lamictal [Lamotrigine] Rash    Patient reported    Metabolic Disorder Labs: Lab Results  Component Value Date   HGBA1C 4.9 05/26/2019   No results found for: "PROLACTIN" Lab Results  Component Value Date   CHOL 272 (H) 08/12/2021   TRIG 193 (H) 08/12/2021   HDL 49 08/12/2021   CHOLHDL 5.6 (H) 08/12/2021   LDLCALC 187 (H) 08/12/2021   LDLCALC 228 (H) 06/05/2020   No results found for: "TSH"  Therapeutic Level Labs: No results found for: "LITHIUM" No results found for: "VALPROATE" No results found for: "CBMZ"  Current Medications: Current Outpatient Medications  Medication Sig Dispense  Refill   acetaminophen (TYLENOL) 325 MG tablet Take 2 tablets (650 mg total) by mouth every 6 (six) hours as needed. (Patient not taking: Reported on 12/29/2021) 30 tablet 0   amphetamine-dextroamphetamine (ADDERALL XR) 20 MG 24 hr capsule Take 1 capsule (20 mg total) by mouth daily. 30 capsule 0   ARIPiprazole (ABILIFY) 15 MG tablet Take 1 tablet (15 mg total) by mouth daily. 30 tablet 3   atorvastatin (LIPITOR) 20 MG tablet Take 1 tablet (20 mg total) by mouth daily. 90 tablet 3   ibuprofen (ADVIL) 600 MG tablet Take 1 tablet (600 mg total) by mouth every 6 (six) hours as needed for headache, mild pain, moderate pain or cramping. 30 tablet 2   sertraline (ZOLOFT) 100 MG tablet Take 1.5 tablets (150 mg total) by mouth daily with breakfast. 45 tablet 3   No current facility-administered medications for this visit.     Musculoskeletal: Strength & Muscle Tone: within normal limits and telehealth visit Gait & Station: normal, telehealth visit Patient leans: N/A  Psychiatric Specialty Exam: Review of Systems  There were no vitals taken for this visit.There is no height or weight on file to calculate BMI.  General Appearance: Well Groomed  Eye Contact:  Good  Speech:  Clear and Coherent and Normal Rate   Volume:  Normal  Mood:  Anxious and Depressed  Affect:  Appropriate and Congruent  Thought Process:  Coherent, Goal Directed, and Linear  Orientation:  Full (Time, Place, and Person)  Thought Content: WDL and Logical   Suicidal Thoughts:  No  Homicidal Thoughts:  No  Memory:  Immediate;   Good Recent;   Good Remote;   Good  Judgement:  Good  Insight:  Good  Psychomotor Activity:  Normal  Concentration:  Concentration: Fair and Attention Span: Fair  Recall:  Good  Fund of Knowledge: Good  Language: Good  Akathisia:  No  Handed:  Right  AIMS (if indicated): not done  Assets:  Communication Skills Desire for Improvement Financial Resources/Insurance Housing Intimacy Leisure Time Physical Health Social Support  ADL's:  Intact  Cognition: WNL  Sleep:  Fair   Screenings: GAD-7    Flowsheet Row Video Visit from 08/27/2022 in [facility] Video Visit from 07/01/2022 in [facility] Video Visit from 04/10/2022 in [facility] Office Visit from 02/12/2022 in [facility] Video Visit from 01/09/2022 in [facility]  Total GAD-7 Score 17 19 12 6 11       PHQ2-9    Flowsheet Row Video Visit from 08/27/2022 in [facility] Video Visit from 07/01/2022 in [facility] Video Visit from 04/10/2022 in [facility] Office Visit from 02/12/2022 in [facility] for Women Video Visit from 01/09/2022 in [facility]  PHQ-2 Total Score 4 6 3 2 3   PHQ-9 Total Score 13 17 12 4 8       Flowsheet Row Admission (Discharged) from 02/17/2022 in [place] PERIOPERATIVE AREA Pre-Admission Testing 60 from 02/09/2022 in [facility] PREADMISSION TESTING Admission (Discharged) from 10/21/2021  in [facility] 1S Maternity Assessment Unit  C-SSRS RISK CATEGORY No Risk No Risk No Risk        Assessment and Plan: Patient endorses anxiety and depression due to life stressors but notes that she is able to cope with it.  Patient has not had labs drawn in over 6 months. Provider ordered UDS, CBC, THS, LFT, Hgb A1C, and BMP. No medication changes made today. Patient agreeable to continue medications as prescribed.    1. Bipolar 2 disorder, major depressive episode (HCC)  Continue- ARIPiprazole (ABILIFY) 15 MG tablet; Take 1 tablet (15 mg total) by mouth daily.  Dispense: 30 tablet; Refill: 3 Continue- sertraline (ZOLOFT) 100 MG tablet; Take 1.5 tablets (150 mg total) by mouth daily with breakfast.  Dispense: 45 tablet; Refill: 3 - CBC w/Diff/Platelet - Basic Metabolic Panel (BMET) - Hepatic function panel - Thyroid Panel With TSH - HgB A1c  2. Attention deficit hyperactivity disorder (ADHD), predominantly inattentive type  Continue- amphetamine-dextroamphetamine (ADDERALL XR) 20 MG 24 hr capsule; Take 1 capsule (20 mg total) by mouth daily.  Dispense: 30 capsule; Refill: 0 - Urine Drug Panel 7   Follow up in 3 months   Salley Slaughter, NP 08/27/2022, 9:17 AM

## 2022-09-02 DIAGNOSIS — F3181 Bipolar II disorder: Secondary | ICD-10-CM | POA: Diagnosis not present

## 2022-09-02 DIAGNOSIS — F900 Attention-deficit hyperactivity disorder, predominantly inattentive type: Secondary | ICD-10-CM | POA: Diagnosis not present

## 2022-09-03 LAB — CBC WITH DIFFERENTIAL/PLATELET
Basophils Absolute: 0.1 10*3/uL (ref 0.0–0.2)
Basos: 1 %
EOS (ABSOLUTE): 0.2 10*3/uL (ref 0.0–0.4)
Eos: 2 %
Hematocrit: 41.9 % (ref 34.0–46.6)
Hemoglobin: 14 g/dL (ref 11.1–15.9)
Immature Grans (Abs): 0 10*3/uL (ref 0.0–0.1)
Immature Granulocytes: 0 %
Lymphocytes Absolute: 2.8 10*3/uL (ref 0.7–3.1)
Lymphs: 27 %
MCH: 29.4 pg (ref 26.6–33.0)
MCHC: 33.4 g/dL (ref 31.5–35.7)
MCV: 88 fL (ref 79–97)
Monocytes Absolute: 0.4 10*3/uL (ref 0.1–0.9)
Monocytes: 4 %
Neutrophils Absolute: 6.8 10*3/uL (ref 1.4–7.0)
Neutrophils: 66 %
Platelets: 328 10*3/uL (ref 150–450)
RBC: 4.77 x10E6/uL (ref 3.77–5.28)
RDW: 11.9 % (ref 11.7–15.4)
WBC: 10.4 10*3/uL (ref 3.4–10.8)

## 2022-09-03 LAB — THYROID PANEL WITH TSH
Free Thyroxine Index: 2.7 (ref 1.2–4.9)
T3 Uptake Ratio: 33 % (ref 24–39)
T4, Total: 8.1 ug/dL (ref 4.5–12.0)
TSH: 0.428 u[IU]/mL — ABNORMAL LOW (ref 0.450–4.500)

## 2022-09-03 LAB — BASIC METABOLIC PANEL
BUN/Creatinine Ratio: 8 — ABNORMAL LOW (ref 9–23)
BUN: 5 mg/dL — ABNORMAL LOW (ref 6–24)
CO2: 24 mmol/L (ref 20–29)
Calcium: 9.5 mg/dL (ref 8.7–10.2)
Chloride: 101 mmol/L (ref 96–106)
Creatinine, Ser: 0.63 mg/dL (ref 0.57–1.00)
Glucose: 90 mg/dL (ref 70–99)
Potassium: 4 mmol/L (ref 3.5–5.2)
Sodium: 142 mmol/L (ref 134–144)
eGFR: 114 mL/min/{1.73_m2} (ref >59)

## 2022-09-03 LAB — HEPATIC FUNCTION PANEL
ALT: 21 IU/L (ref 0–32)
AST: 20 IU/L (ref 0–40)
Albumin: 4.6 g/dL (ref 3.9–4.9)
Alkaline Phosphatase: 135 IU/L — ABNORMAL HIGH (ref 44–121)
Bilirubin Total: <0.2 mg/dL (ref 0.0–1.2)
Bilirubin, Direct: <0.1 mg/dL (ref 0.00–0.40)
Total Protein: 7.4 g/dL (ref 6.0–8.5)

## 2022-09-03 LAB — HEMOGLOBIN A1C
Est. average glucose Bld gHb Est-mCnc: 108 mg/dL
Hgb A1c MFr Bld: 5.4 % (ref 4.8–5.6)

## 2022-09-06 LAB — URINE DRUG PANEL 7
Amphetamines, Urine: NEGATIVE ng/mL
Barbiturate Quant, Ur: NEGATIVE ng/mL
Benzodiazepine Quant, Ur: NEGATIVE ng/mL
Cannabinoid Quant, Ur: POSITIVE — AB
Cocaine (Metab.): NEGATIVE ng/mL
Opiate Quant, Ur: NEGATIVE ng/mL
PCP Quant, Ur: NEGATIVE ng/mL

## 2022-09-07 NOTE — Progress Notes (Signed)
Provider discussed lab results with patient.

## 2022-10-15 ENCOUNTER — Telehealth: Payer: Medicaid Other | Admitting: Emergency Medicine

## 2022-10-15 DIAGNOSIS — N390 Urinary tract infection, site not specified: Secondary | ICD-10-CM | POA: Diagnosis not present

## 2022-10-15 MED ORDER — CEPHALEXIN 500 MG PO CAPS: 500.0000 mg | ORAL_CAPSULE | Freq: Two times a day (BID) | ORAL | 0 refills | Status: DC

## 2022-10-15 NOTE — Patient Instructions (Signed)
  [name], thank you for joining Montine Circle, PA-C for today's virtual visit.  While this provider is not your primary care provider (PCP), if your PCP is located in our provider database this encounter information will be shared with them immediately following your visit.   [facility] account gives you access to today's visit and all your visits, tests, and labs performed at [facility] " click here if you don't have a [facility] account or go to [email or web address]  Consent: (Patient) Tanya Moore provided verbal consent for this virtual visit at the beginning of the encounter.  Current Medications:  Current Outpatient Medications:    acetaminophen (TYLENOL) 325 MG tablet, Take 2 tablets (650 mg total) by mouth every 6 (six) hours as needed. (Patient not taking: Reported on 12/29/2021), Disp: 30 tablet, Rfl: 0   amphetamine-dextroamphetamine (ADDERALL XR) 20 MG 24 hr capsule, Take 1 capsule (20 mg total) by mouth daily., Disp: 30 capsule, Rfl: 0   ARIPiprazole (ABILIFY) 15 MG tablet, Take 1 tablet (15 mg total) by mouth daily., Disp: 30 tablet, Rfl: 3   atorvastatin (LIPITOR) 20 MG tablet, Take 1 tablet (20 mg total) by mouth daily., Disp: 90 tablet, Rfl: 3   cephALEXin (KEFLEX) 500 MG capsule, Take 1 capsule (500 mg total) by mouth 2 (two) times daily., Disp: 14 capsule, Rfl: 0   ibuprofen (ADVIL) 600 MG tablet, Take 1 tablet (600 mg total) by mouth every 6 (six) hours as needed for headache, mild pain, moderate pain or cramping., Disp: 30 tablet, Rfl: 2   sertraline (ZOLOFT) 100 MG tablet, Take 1.5 tablets (150 mg total) by mouth daily with breakfast., Disp: 45 tablet, Rfl: 3   Medications ordered in this encounter:  Meds ordered this encounter  Medications   cephALEXin (KEFLEX) 500 MG capsule    Sig: Take 1 capsule (500 mg total) by mouth 2 (two) times daily.    Dispense:  14 capsule    Refill:  0    Order Specific Question:    Supervising Provider    Answer:   Chase Picket [id number]     *If you need refills on other medications prior to your next appointment, please contact your pharmacy*  Follow-Up: Call back or seek an in-person evaluation if the symptoms worsen or if the condition fails to improve as anticipated.  [place] [phone]  Other Instructions    If you have been instructed to have an in-person evaluation today at a local Urgent Care facility, please use the link below. It will take you to a list of all of our available [facility], including address, phone number and hours of operation. Please do not delay care.  [facility]  If you or a family member do not have a primary care provider, use the link below to schedule a visit and establish care. When you choose a [facility] primary care physician or advanced practice provider, you gain a long-term partner in health. Find a Primary Care Provider  Learn more about [facility]'s in-office and virtual care options: [facility]

## 2022-10-15 NOTE — Progress Notes (Signed)
Virtual Visit Consent   Tanya Moore, you are scheduled for a virtual visit with a [facility] provider today. Just as with appointments in the office, your consent must be obtained to participate. Your consent will be active for this visit and any virtual visit you may have with one of our providers in the next 365 days. If you have a MyChart account, a copy of this consent can be sent to you electronically.  As this is a virtual visit, video technology does not allow for your provider to perform a traditional examination. This may limit your provider's ability to fully assess your condition. If your provider identifies any concerns that need to be evaluated in person or the need to arrange testing (such as labs, EKG, etc.), we will make arrangements to do so. Although advances in technology are sophisticated, we cannot ensure that it will always work on either your end or our end. If the connection with a video visit is poor, the visit may have to be switched to a telephone visit. With either a video or telephone visit, we are not always able to ensure that we have a secure connection.  By engaging in this virtual visit, you consent to the provision of healthcare and authorize for your insurance to be billed (if applicable) for the services provided during this visit. Depending on your insurance coverage, you may receive a charge related to this service.  I need to obtain your verbal consent now. Are you willing to proceed with your visit today? Tanya Moore has provided verbal consent on 10/15/2022 for a virtual visit (video or telephone). Montine Circle, PA-C  Date: 10/15/2022 11:11 AM  Virtual Visit via Video Note   I, Montine Circle, connected with  Tanya Moore  ([id number], Dec 05, 1980) on 10/15/22 at 11:15 AM EST by a video-enabled telemedicine application and verified that I am speaking with the correct person using two identifiers.  Location: Patient: Virtual Visit Location Patient:  Home Provider: Virtual Visit Location Provider: Home Office   I discussed the limitations of evaluation and management by telemedicine and the availability of in person appointments. The patient expressed understanding and agreed to proceed.    History of Present Illness: Tanya Moore is a 42 y.o. who identifies as a female who was assigned female at birth, and is being seen today for UTI symptoms.  States that she has been experiencing urinary urgency and frequency.  Denies dysuria.  Denies fever or chills associated with the symptoms. Denies nausea or vomiting.  Denies pregnancy or breast feeding.  Has had UTIs in the past, but not for several years.  HPI: HPI  Problems:  Patient Active Problem List   Diagnosis Date Noted   Unwanted fertility    Attention deficit hyperactivity disorder (ADHD), predominantly inattentive type 10/02/2020   Bipolar disorder ([place]) 06/05/2020   Gestational hypertension 11/09/2019   History of low transverse cesarean section 05/26/2019   Hypercholesteremia 02/19/2019    Allergies:  Allergies  Allergen Reactions   Lamictal [Lamotrigine] Rash    Patient reported   Medications:  Current Outpatient Medications:    acetaminophen (TYLENOL) 325 MG tablet, Take 2 tablets (650 mg total) by mouth every 6 (six) hours as needed. (Patient not taking: Reported on 12/29/2021), Disp: 30 tablet, Rfl: 0   amphetamine-dextroamphetamine (ADDERALL XR) 20 MG 24 hr capsule, Take 1 capsule (20 mg total) by mouth daily., Disp: 30 capsule, Rfl: 0   ARIPiprazole (ABILIFY) 15 MG tablet, Take 1 tablet (  15 mg total) by mouth daily., Disp: 30 tablet, Rfl: 3   atorvastatin (LIPITOR) 20 MG tablet, Take 1 tablet (20 mg total) by mouth daily., Disp: 90 tablet, Rfl: 3   ibuprofen (ADVIL) 600 MG tablet, Take 1 tablet (600 mg total) by mouth every 6 (six) hours as needed for headache, mild pain, moderate pain or cramping., Disp: 30 tablet, Rfl: 2   sertraline (ZOLOFT) 100 MG tablet, Take 1.5  tablets (150 mg total) by mouth daily with breakfast., Disp: 45 tablet, Rfl: 3  Observations/Objective: Patient is well-developed, well-nourished in no acute distress.  Resting comfortably  at home.  Head is normocephalic, atraumatic.  No labored breathing.  Speech is clear and coherent with logical content.  Patient is alert and oriented at baseline.    Assessment and Plan: 1. Urinary tract infection without hematuria, site unspecified  Symptoms are consistent with patient's prior UTIs.  Will treat with Keflex.  Return precautions discussed.  Meds ordered this encounter  Medications   cephALEXin (KEFLEX) 500 MG capsule    Sig: Take 1 capsule (500 mg total) by mouth 2 (two) times daily.    Dispense:  14 capsule    Refill:  0    Order Specific Question:   Supervising Provider    Answer:   Chase Picket [id number]     Follow Up Instructions: I discussed the assessment and treatment plan with the patient. The patient was provided an opportunity to ask questions and all were answered. The patient agreed with the plan and demonstrated an understanding of the instructions.  A copy of instructions were sent to the patient via MyChart unless otherwise noted below.     The patient was advised to call back or seek an in-person evaluation if the symptoms worsen or if the condition fails to improve as anticipated.  Time:  I spent 11 minutes with the patient via telehealth technology discussing the above problems/concerns.    Montine Circle, PA-C

## 2022-10-27 ENCOUNTER — Telehealth (HOSPITAL_COMMUNITY): Payer: Self-pay | Admitting: *Deleted

## 2022-10-27 ENCOUNTER — Other Ambulatory Visit (HOSPITAL_COMMUNITY): Payer: Self-pay | Admitting: Psychiatry

## 2022-10-27 DIAGNOSIS — F900 Attention-deficit hyperactivity disorder, predominantly inattentive type: Secondary | ICD-10-CM

## 2022-10-27 MED ORDER — AMPHETAMINE-DEXTROAMPHET ER 20 MG PO CP24: 20.0000 mg | ORAL_CAPSULE | Freq: Every day | ORAL | 0 refills | Status: DC

## 2022-10-27 NOTE — Telephone Encounter (Signed)
Patient called asking for refill of Adderall. Next appointment 11/10/22.

## 2022-10-27 NOTE — Telephone Encounter (Signed)
Medication refilled and sent to preferred phamacy.

## 2022-11-11 ENCOUNTER — Telehealth (INDEPENDENT_AMBULATORY_CARE_PROVIDER_SITE_OTHER): Payer: Medicaid Other | Admitting: Psychiatry

## 2022-11-11 ENCOUNTER — Encounter (HOSPITAL_COMMUNITY): Payer: Self-pay | Admitting: Psychiatry

## 2022-11-11 DIAGNOSIS — F3181 Bipolar II disorder: Secondary | ICD-10-CM

## 2022-11-11 DIAGNOSIS — F900 Attention-deficit hyperactivity disorder, predominantly inattentive type: Secondary | ICD-10-CM | POA: Diagnosis not present

## 2022-11-11 MED ORDER — SERTRALINE HCL 100 MG PO TABS: 150.0000 mg | ORAL_TABLET | Freq: Every day | ORAL | 3 refills | Status: DC

## 2022-11-11 MED ORDER — ARIPIPRAZOLE 15 MG PO TABS: 15.0000 mg | ORAL_TABLET | Freq: Every day | ORAL | 3 refills | Status: DC

## 2022-11-11 NOTE — Progress Notes (Signed)
BH MD/PA/NP OP Progress Note Virtual Visit via Video Note  I connected with Tanya Moore on 11/11/22 at  8:00 AM EDT by a video enabled telemedicine application and verified that I am speaking with the correct person using two identifiers.  Location: Patient: Home Provider: Clinic   I discussed the limitations of evaluation and management by telemedicine and the availability of in person appointments. The patient expressed understanding and agreed to proceed.  I provided 30 minutes of non-face-to-face time during this encounter.       11/11/2022 8:40 AM Tanya Moore  MRN:  [id number]  Chief Complaint: "I have a hard time focusing at work".   HPI: 42 year old female seen today for follow up psychiatric evaluation. She has a psychiatric history of ADHD, Bipolar 2, anxiety, and depression. She is currently managed on Adderall XR 20 mg daily, Zoloft 150 mg daily, and Abilify 15 mg daily.She reports her medications are somewhat effective in managing her psychiatric conditions.  Today she was well groomed, pleasant, cooperative and engaged in conversation.  She informed Probation officer that she has been having a hard time concentrating.  She notes that this affects her work.  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  At times she notes that she is disorganized, forgetful, and inattentive to mentally taxing task.  She reports that she finds the Adderall wears off prior to her shift being over.   Since her last visit she reports her anxiety and depression has somewhat improved despite current life stressors.  Patient informed Probation officer that she continues to be worried about finances and is looking for a second job.  She also notes that she worries about her children's wellbeing and her family.  Patient notes that she continues to unpack her apartment after being here for 6 months.  Today provider conducted a GAD-7 and patient scored a 11, at her last visit she  scored a 17.  Provider also conducted PHQ-9 and patient scored a 12, at her last visit she scored a 13.  She endorses adequate appetite.  She notes that her sleep fluctuates.  Today she denies SI/HI/VAH, mania, paranoia.    Today provider informed patient that Adderall could be increased to 25 mg daily to help manage symptoms of ADHD.  Adderall however not filled at this time because it was recently filled on 10/27/2022.  She will continue her other medications as prescribed.  No other concerns noted at this time.  Visit Diagnosis:    ICD-10-CM   1. Bipolar 2 disorder, major depressive episode  F31.81 ARIPiprazole (ABILIFY) 15 MG tablet    sertraline (ZOLOFT) 100 MG tablet    2. Attention deficit hyperactivity disorder (ADHD), predominantly inattentive type  F90.0       Past Psychiatric History: History of anxiety, was diagnosed with bipolar disorder back in 2016.  Has been tried on several her medications.  Was being seen by PCP followed by [facility].  Stopped taking medications after she found out she is pregnant in 2020.  Past Medical History:  Past Medical History:  Diagnosis Date   ADHD    Allergy    Anxiety    Bipolar 1 disorder ([place])    Depression    Ectopic pregnancy with intrauterine pregnancy/Heterotopic Pregnancy 05/03/2019   S/p laparoscopic right salpingectomy on 05/03/19 05/03/19 SURGICAL PATHOLOGY FALLOPIAN TUBE WITH ECTOPIC PREGNANCY, RIGHT, SALPINGECTOMY:  - Benign fallopian tube with chorionic villi consistent with ectopic pregnancy.  Headache    Hemoperitoneum due to rupture of right tubal ectopic pregnancy 05/03/2019   History of ectopic pregnancy in 11/2018 11/20/2018   Mtx#1 given 4/3: Hcg day 0 (147), day 4 (180), day 7 (193) Mtx # 2 given 4/9 (193)->4/12 (153)->4/15 (79)   Hyperlipidemia    Infection    UTI   Pregnancy induced hypertension    Psoriasis 2020    Past Surgical History:  Procedure Laterality Date   APPENDECTOMY     BUNIONECTOMY     CESAREAN  SECTION  2017   CESAREAN SECTION N/A 11/27/2019   Procedure: CESAREAN SECTION AND IUD INSERTION;  Surgeon: Chancy Milroy, MD;  Location: [facility];  Service: Obstetrics;  Laterality: N/A;   DIAGNOSTIC LAPAROSCOPY WITH REMOVAL OF ECTOPIC PREGNANCY N/A 05/03/2019   Procedure: RIGHT SALPINGECTOMY AND REMOVAL OF ECTOPIC PREGNANCY;  Surgeon: Osborne Oman, MD;  [facility];  Service: Gynecology;  Laterality: N/A;   LAPAROSCOPIC BILATERAL SALPINGECTOMY Left 02/17/2022   Procedure: LAPAROSCOPIC LEFT SALPINGECTOMY;  Surgeon: Griffin Basil, MD;  Location: [facility];  Service: Gynecology;  Laterality: Left;   TONSILLECTOMY      Family Psychiatric History: Sister depression   Family History:  Family History  Problem Relation Age of Onset   Hypertension Mother    Hypertension Father    Cancer Father        prostate   Depression Sister     Social History:  Social History   Socioeconomic History   Marital status: Single    Spouse name: Not on file   Number of children: 2   Years of education: Not on file   Highest education level: Not on file  Occupational History   Not on file  Tobacco Use   Smoking status: Former    Types: Cigarettes    Quit date: 2017    Years since quitting: 7.2   Smokeless tobacco: Never  Vaping Use   Vaping Use: Some days  Substance and Sexual Activity   Alcohol use: No   Drug use: No   Sexual activity: Not Currently    Birth control/protection: None  Other Topics Concern   Not on file  Social History Narrative   Not on file   Social Determinants of Health   Financial Resource Strain: Not on file  Food Insecurity: Food Insecurity Present (02/12/2022)   Hunger Vital Sign    Worried About [place] in the Last Year: Sometimes true    Ran Out of Food in the Last Year: Never true  Transportation Needs: No Transportation Needs (02/12/2022)   PRAPARE - Hydrologist (Medical): No    Lack of Transportation  (Non-Medical): No  Physical Activity: Not on file  Stress: Not on file  Social Connections: Not on file    Allergies:  Allergies  Allergen Reactions   Lamictal [Lamotrigine] Rash    Patient reported    Metabolic Disorder Labs: Lab Results  Component Value Date   HGBA1C 5.4 09/02/2022   No results found for: "PROLACTIN" Lab Results  Component Value Date   CHOL 272 (H) 08/12/2021   TRIG 193 (H) 08/12/2021   HDL 49 08/12/2021   CHOLHDL 5.6 (H) 08/12/2021   LDLCALC 187 (H) 08/12/2021   LDLCALC 228 (H) 06/05/2020   Lab Results  Component Value Date   TSH 0.428 (L) 09/02/2022    Therapeutic Level Labs: No results found for: "LITHIUM" No results found for: "VALPROATE" No results  found for: "CBMZ"  Current Medications: Current Outpatient Medications  Medication Sig Dispense Refill   acetaminophen (TYLENOL) 325 MG tablet Take 2 tablets (650 mg total) by mouth every 6 (six) hours as needed. (Patient not taking: Reported on 12/29/2021) 30 tablet 0   amphetamine-dextroamphetamine (ADDERALL XR) 20 MG 24 hr capsule Take 1 capsule (20 mg total) by mouth daily. 30 capsule 0   ARIPiprazole (ABILIFY) 15 MG tablet Take 1 tablet (15 mg total) by mouth daily. 30 tablet 3   atorvastatin (LIPITOR) 20 MG tablet Take 1 tablet (20 mg total) by mouth daily. 90 tablet 3   cephALEXin (KEFLEX) 500 MG capsule Take 1 capsule (500 mg total) by mouth 2 (two) times daily. 14 capsule 0   ibuprofen (ADVIL) 600 MG tablet Take 1 tablet (600 mg total) by mouth every 6 (six) hours as needed for headache, mild pain, moderate pain or cramping. 30 tablet 2   sertraline (ZOLOFT) 100 MG tablet Take 1.5 tablets (150 mg total) by mouth daily with breakfast. 45 tablet 3   No current facility-administered medications for this visit.     Musculoskeletal: Strength & Muscle Tone: within normal limits and telehealth visit Gait & Station: normal, telehealth visit Patient leans: N/A  Psychiatric Specialty  Exam: Review of Systems  There were no vitals taken for this visit.There is no height or weight on file to calculate BMI.  General Appearance: Well Groomed  Eye Contact:  Good  Speech:  Clear and Coherent and Normal Rate  Volume:  Normal  Mood:  Euthymic  Affect:  Appropriate and Congruent  Thought Process:  Coherent, Goal Directed, and Linear  Orientation:  Full (Time, Place, and Person)  Thought Content: WDL and Logical   Suicidal Thoughts:  No  Homicidal Thoughts:  No  Memory:  Immediate;   Good Recent;   Good Remote;   Good  Judgement:  Good  Insight:  Good  Psychomotor Activity:  Normal  Concentration:  Concentration: Fair and Attention Span: Fair  Recall:  Good  Fund of Knowledge: Good  Language: Good  Akathisia:  No  Handed:  Right  AIMS (if indicated): not done  Assets:  Communication Skills Desire for Improvement Financial Resources/Insurance Housing Intimacy Leisure Time Physical Health Social Support  ADL's:  Intact  Cognition: WNL  Sleep:  Fair   Screenings: GAD-7    Flowsheet Row Video Visit from 11/11/2022 in [facility] Video Visit from 08/27/2022 in [facility] Video Visit from 07/01/2022 in [facility] Video Visit from 04/10/2022 in [facility] Office Visit from 02/12/2022 in [place] for [place] at [facility]  Total GAD-7 Score 11 17 19 12 6       PHQ2-9    Flowsheet Row Video Visit from 11/11/2022 in [facility] Video Visit from 08/27/2022 in [facility] Video Visit from 07/01/2022 in [facility] Video Visit from 04/10/2022 in [facility] Office Visit from 02/12/2022 in [place] for [place] at [facility]  PHQ-2 Total Score 4 4 6 3 2   PHQ-9 Total Score 12 13 17 12 4        Flowsheet Row Admission (Discharged) from 02/17/2022 in [facility] [facility] from 02/09/2022 in [facility] PREADMISSION TESTING Admission (Discharged) from 10/21/2021 in [facility]  C-SSRS RISK CATEGORY No Risk  No Risk No Risk        Assessment and Plan: Patient endorses symtpms of ADHD. She notes that her anxiety and depression has improved. Today provider informed patient that Adderall could be increased to 25 mg daily to help manage symptoms of ADHD.  Adderall however not filled at this time because it was recently filled on 10/27/2022.  She will continue her other medications as prescribed.  1. Bipolar 2 disorder, major depressive episode  Continue- ARIPiprazole (ABILIFY) 15 MG tablet; Take 1 tablet (15 mg total) by mouth daily.  Dispense: 30 tablet; Refill: 3 Continue- sertraline (ZOLOFT) 100 MG tablet; Take 1.5 tablets (150 mg total) by mouth daily with breakfast.  Dispense: 45 tablet; Refill: 3  2. Attention deficit hyperactivity disorder (ADHD), predominantly inattentive type     Follow up in 3 months   Salley Slaughter, NP 11/11/2022, 8:40 AM

## 2022-11-30 ENCOUNTER — Other Ambulatory Visit (HOSPITAL_COMMUNITY)
Admission: RE | Admit: 2022-11-30 | Discharge: 2022-11-30 | Disposition: A | Discharge status: Home / Self Care | Payer: Medicaid Other | Source: Ambulatory Visit | Attending: Family Medicine | Admitting: Family Medicine

## 2022-11-30 ENCOUNTER — Ambulatory Visit (INDEPENDENT_AMBULATORY_CARE_PROVIDER_SITE_OTHER): Payer: Medicaid Other | Admitting: Family Medicine

## 2022-11-30 ENCOUNTER — Encounter: Payer: Self-pay | Admitting: Family Medicine

## 2022-11-30 VITALS — BP 125/89 | HR 85 | Ht 64.0 in | Wt 140.8 lb

## 2022-11-30 DIAGNOSIS — F17200 Nicotine dependence, unspecified, uncomplicated: Secondary | ICD-10-CM

## 2022-11-30 DIAGNOSIS — R748 Abnormal levels of other serum enzymes: Secondary | ICD-10-CM

## 2022-11-30 DIAGNOSIS — Z113 Encounter for screening for infections with a predominantly sexual mode of transmission: Secondary | ICD-10-CM

## 2022-11-30 DIAGNOSIS — D2362 Other benign neoplasm of skin of left upper limb, including shoulder: Secondary | ICD-10-CM

## 2022-11-30 DIAGNOSIS — E7800 Pure hypercholesterolemia, unspecified: Secondary | ICD-10-CM

## 2022-11-30 DIAGNOSIS — Z0000 Encounter for general adult medical examination without abnormal findings: Secondary | ICD-10-CM | POA: Diagnosis not present

## 2022-11-30 MED ORDER — VARENICLINE TARTRATE 0.5 MG PO TABS: ORAL_TABLET | ORAL | 0 refills | Status: AC

## 2022-11-30 MED ORDER — VARENICLINE TARTRATE 1 MG PO TABS: 1.0000 mg | ORAL_TABLET | Freq: Two times a day (BID) | ORAL | 0 refills | Status: DC

## 2022-11-30 NOTE — Patient Instructions (Addendum)
It was great to see you!  I have sent a medication to help you quit smoking. Take 0.5mg  once daily for 3 days. Then 0.5mg  twice daily for 4 days. Then  twice daily. Schedule a follow-up appointment in 2 months to to check in.  We are checking some labs. I will send you a MyChart message with the results or call if needed.  Take care and seek immediate care sooner if you develop any concerns.  Dr. [facility]   Things to do to keep yourself healthy  - Exercise at least 30-45 minutes a day, 3-4 days a week.  - Eat a diet with lots of fruits and vegetables (5 servings per day). - Avoid sugar sweetened beverages and processed foods whenever possible. - Seatbelts can save your life. Wear them always.  - Safe sex - use a condom to prevent STIs. - Alcohol -  If you drink, do it moderately, less than 2 drinks per day. - Sleep - aim for 7-8 hours nightly. - Limit screen time to no more than 2 hours per day.  - Health Care Power of Attorney. Choose someone to make decisions for you if you are not able.  - Depression is common in our stressful world. If you're feeling down or losing interest in things you normally enjoy, please come in for a visit.  - Violence - If anyone is threatening or hurting you, please call immediately.

## 2022-11-30 NOTE — Progress Notes (Signed)
    SUBJECTIVE:   Chief complaint/HPI: annual examination  Tanya Moore is a 42 y.o. who presents today for an annual exam.   Current concerns:  L arm bump-- present since at least 2019, getting slightly bigger, asymptomatic; would like it checked  History tabs reviewed and updated.   Interim updates: Separated from her long-term partner in October. Doing alright overall. Follows with her psychiatrist regularly.  Review of systems form reviewed and notable for: tobacco use-- started smoking again. 0.5PPD. Interested in quitting.  OBJECTIVE:   BP 125/89   Pulse 85   Ht  (1.626 m)   Wt 140 lb 12.8 oz (63.9 kg)   LMP 11/23/2022 (Approximate)   SpO2 100%   BMI 24.17 kg/m   Gen: NAD, pleasant, able to participate in exam HEENT: [place]/AT, normal sclera and conjunctiva, nares patent bilaterally, oropharynx unremarkable Neck: supple, no cervical or supraclavicular lymphadenopathy, thyroid smooth and normal in size CV: RRR, normal S1/S2, no murmur Resp: Normal effort, lungs CTAB GI: abdomen soft, non-tender, non-distended GU/GYN: Exam performed in the presence of a chaperone. External genitalia within normal limits.  Vaginal mucosa pink, moist, normal rugae.  Nonfriable cervix without lesions, no discharge or bleeding noted on speculum exam.   Extremities: no edema or cyanosis Skin: warm and dry, 3mm firm nodule left lateral upper arm- appears slightly erythematous with central puckering when pinched although is located in tattoo so slightly difficult to see Neuro: alert, no obvious focal deficits Psych: Normal affect and mood   ASSESSMENT/PLAN:   Dermatofibroma of left upper arm Nodule on left arm most consistent with dermatofibroma although it is located within tattoo so exam slightly limited. Reassurance provided. If continued enlargement or it becomes symptomatic in any way can consider further evaluation.  Tobacco use disorder Currently smoking 0.5PPD and interested in  quitting. Cessation counseling provided today. Discussed options including nicotine replacement or medications and patient elected to start Chantix. Offered referral to Dr Raymondo Band but patient declines. Follow up in 1-2 months.   Elevated Alk Phos Mildly elevated at 135 on 09/02/22. Recheck hepatic function panel today.  Annual Examination  See AVS for age appropriate recommendations.   PHQ score 8, reviewed.  Blood pressure reviewed and at goal.  Asked about intimate partner violence and resources given as appropriate  The patient currently uses BTL for contraception.  Considered the following items based upon USPSTF recommendations: Diabetes screening:  done in January-- normal Screening for elevated cholesterol: ordered HIV testing: ordered Hepatitis B:  not at high risk Syphilis if at high risk: ordered GC/CT  ordered Reviewed risk factors for latent tuberculosis and not indicated Reviewed risk factors for osteoporosis. Using FRAX tool estimated risk of major osteoporotic fracture of  2.0%, early screening not ordered   Discussed family history, BRCA testing not indicated.  Cervical cancer screening: prior Pap reviewed, repeat due in October 2025 Breast cancer screening: discussed potential benefits, risks including overdiagnosis and biopsy, elected proceed with mammogram Colorectal cancer screening: not applicable given age.   Follow up in 1-2 months for smoking cessation f/u.   Maury Dus, MD [facility]

## 2022-11-30 NOTE — Assessment & Plan Note (Signed)
Currently smoking 0.5PPD and interested in quitting. Cessation counseling provided today. Discussed options including nicotine replacement or medications and patient elected to start Chantix. Offered referral to Dr Raymondo Band but patient declines. Follow up in 1-2 months.

## 2022-11-30 NOTE — Assessment & Plan Note (Signed)
Nodule on left arm most consistent with dermatofibroma although it is located within tattoo so exam slightly limited. Reassurance provided. If continued enlargement or it becomes symptomatic in any way can consider further evaluation.

## 2022-12-01 LAB — LIPID PANEL
Chol/HDL Ratio: 2.9 ratio (ref 0.0–4.4)
Cholesterol, Total: 173 mg/dL (ref 100–199)
HDL: 60 mg/dL (ref >39)
LDL Chol Calc (NIH): 98 mg/dL (ref 0–99)
Triglycerides: 80 mg/dL (ref 0–149)
VLDL Cholesterol Cal: 15 mg/dL (ref 5–40)

## 2022-12-01 LAB — HEPATIC FUNCTION PANEL
ALT: 15 IU/L (ref 0–32)
AST: 22 IU/L (ref 0–40)
Albumin: 4.6 g/dL (ref 3.9–4.9)
Alkaline Phosphatase: 106 IU/L (ref 44–121)
Bilirubin Total: 0.4 mg/dL (ref 0.0–1.2)
Bilirubin, Direct: <0.1 mg/dL (ref 0.00–0.40)
Total Protein: 6.9 g/dL (ref 6.0–8.5)

## 2022-12-01 LAB — CERVICOVAGINAL ANCILLARY ONLY
Chlamydia: NEGATIVE
Comment: NEGATIVE
Comment: NEGATIVE
Comment: NORMAL
Neisseria Gonorrhea: NEGATIVE
Trichomonas: NEGATIVE

## 2022-12-01 LAB — RPR: RPR Ser Ql: NONREACTIVE

## 2022-12-01 LAB — HIV ANTIBODY (ROUTINE TESTING W REFLEX): HIV Screen 4th Generation wRfx: NONREACTIVE

## 2022-12-15 ENCOUNTER — Other Ambulatory Visit (HOSPITAL_COMMUNITY): Payer: Self-pay | Admitting: Psychiatry

## 2022-12-15 ENCOUNTER — Telehealth (HOSPITAL_COMMUNITY): Payer: Self-pay | Admitting: *Deleted

## 2022-12-15 DIAGNOSIS — F900 Attention-deficit hyperactivity disorder, predominantly inattentive type: Secondary | ICD-10-CM

## 2022-12-15 MED ORDER — AMPHET-DEXTROAMPHET 3-BEAD ER 25 MG PO CP24: 25.0000 mg | ORAL_CAPSULE | Freq: Every day | ORAL | 0 refills | Status: DC

## 2022-12-15 NOTE — Telephone Encounter (Signed)
Medication refilled, increased to 25 mg, and sent to preferred pharmacy.

## 2022-12-15 NOTE — Telephone Encounter (Signed)
VM from patient requesting her adderall rx which she said was supposed to be increased to 25 mg qd. She was last seen by [facility] DNP and has a future appt with Brittney on 6/12. Patient should be out of her meds, last written in April. WIll forward this request to Dr Doyne Keel.

## 2022-12-18 ENCOUNTER — Other Ambulatory Visit: Payer: Self-pay

## 2022-12-18 DIAGNOSIS — E7800 Pure hypercholesterolemia, unspecified: Secondary | ICD-10-CM

## 2022-12-18 MED ORDER — ATORVASTATIN CALCIUM 20 MG PO TABS: 20.0000 mg | ORAL_TABLET | Freq: Every day | ORAL | 3 refills | Status: DC

## 2023-01-15 IMAGING — MG MM DIGITAL SCREENING BILAT W/ TOMO AND CAD
6 of 10 series · 6 of 30 positions shown · non-contrast
Comparison: None.

CLINICAL DATA: Screening.

EXAM:
DIGITAL SCREENING BILATERAL MAMMOGRAM WITH TOMOSYNTHESIS AND CAD
TECHNIQUE: Bilateral screening digital craniocaudal and mediolateral oblique
mammograms were obtained. Bilateral screening digital breast
tomosynthesis was performed. The images were evaluated with
computer-aided detection.

[L CC synth-2D]
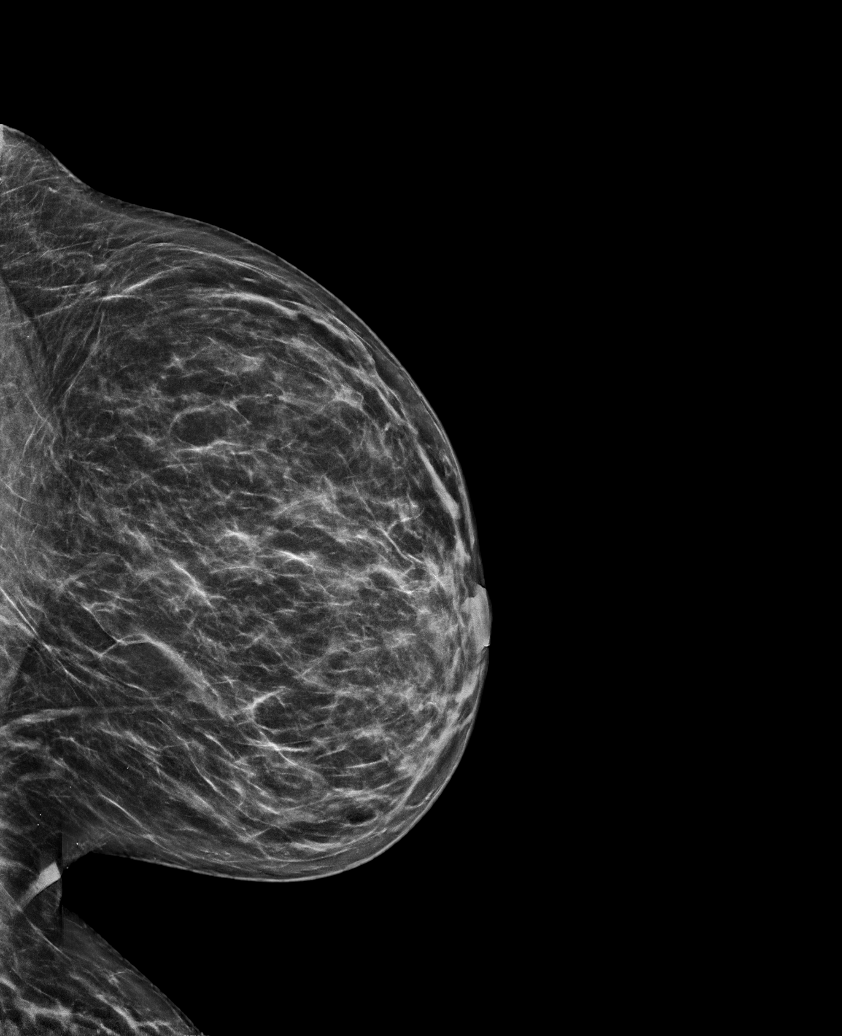

[L MLO synth-2D (1 of 2)]
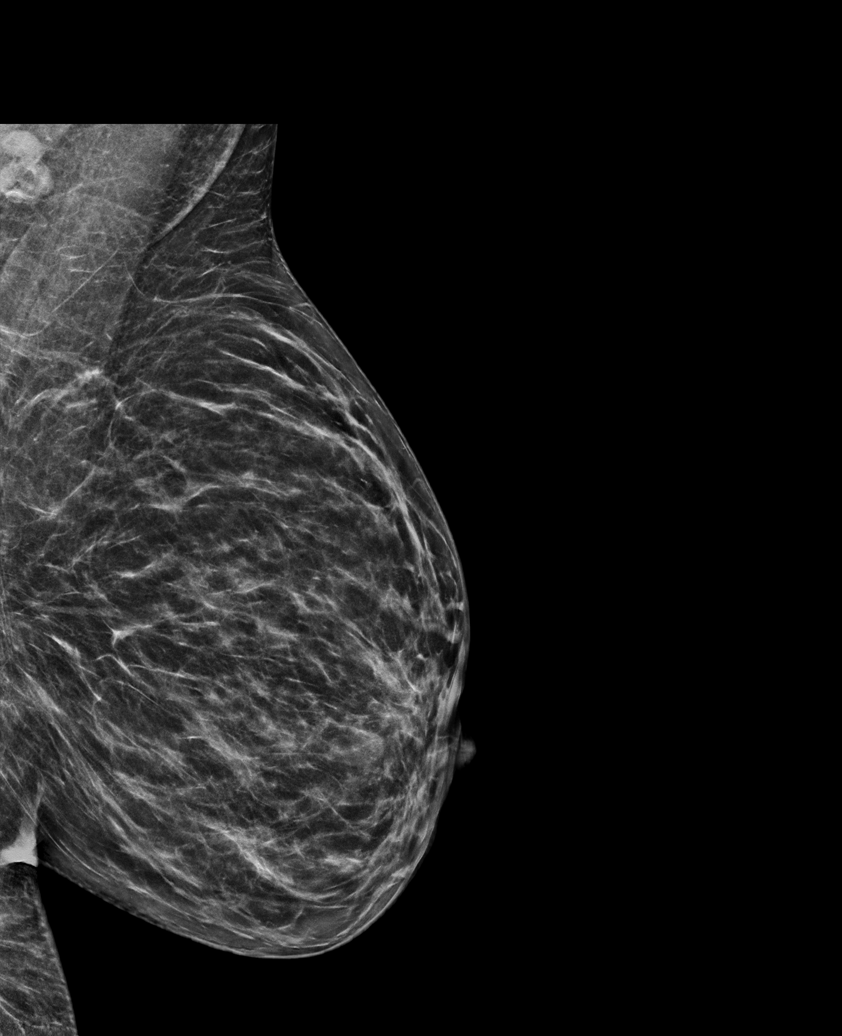

[R MLO synth-2D]
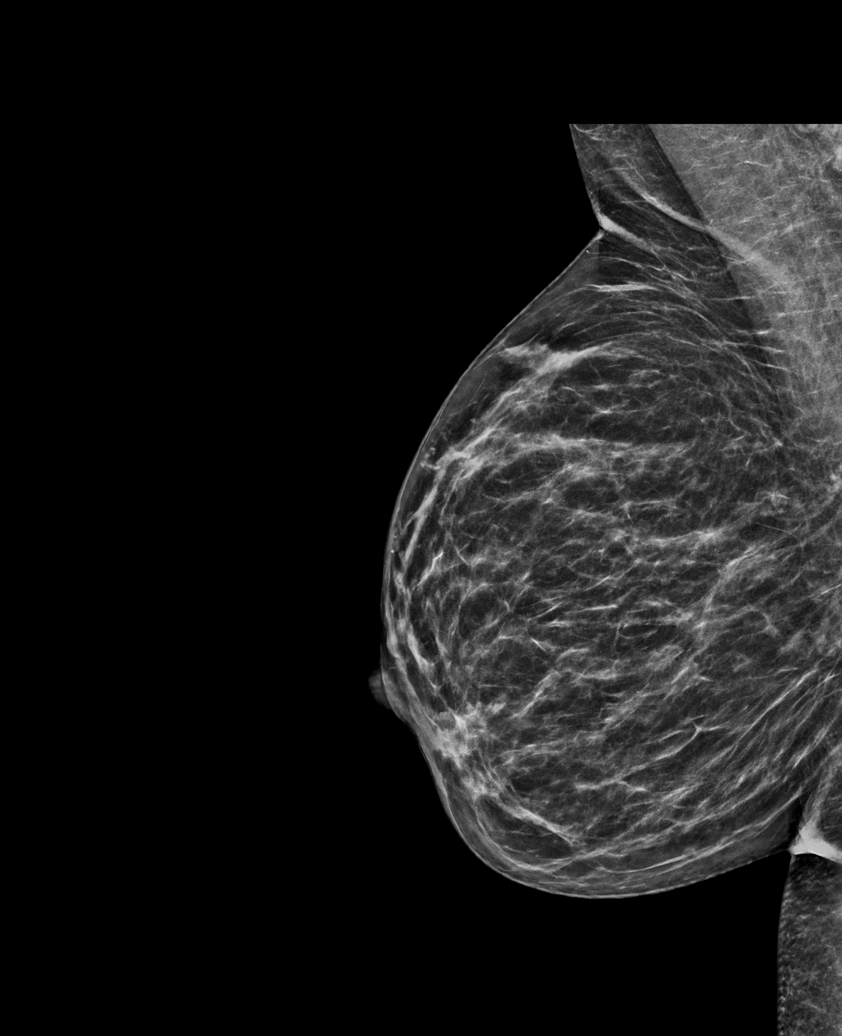

[L MLO synth-2D (2 of 2)]
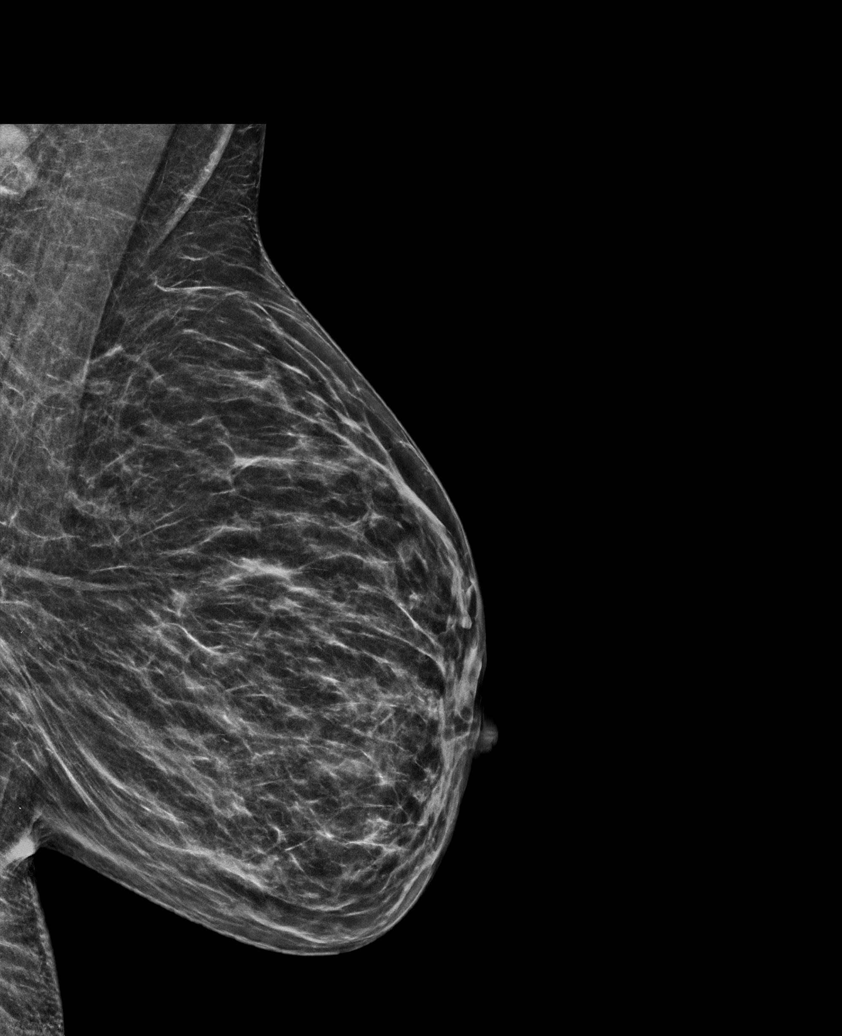

[R CC synth-2D]
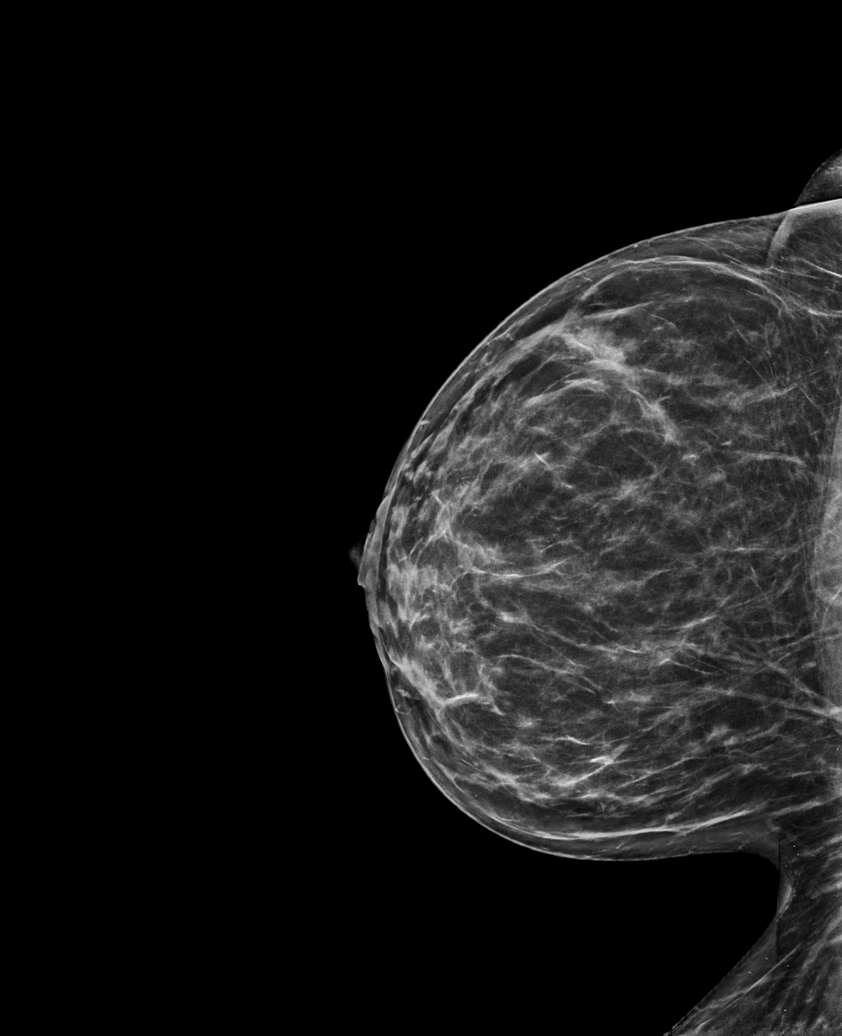

[L MLO tomo · tomo slice 29/58.0]
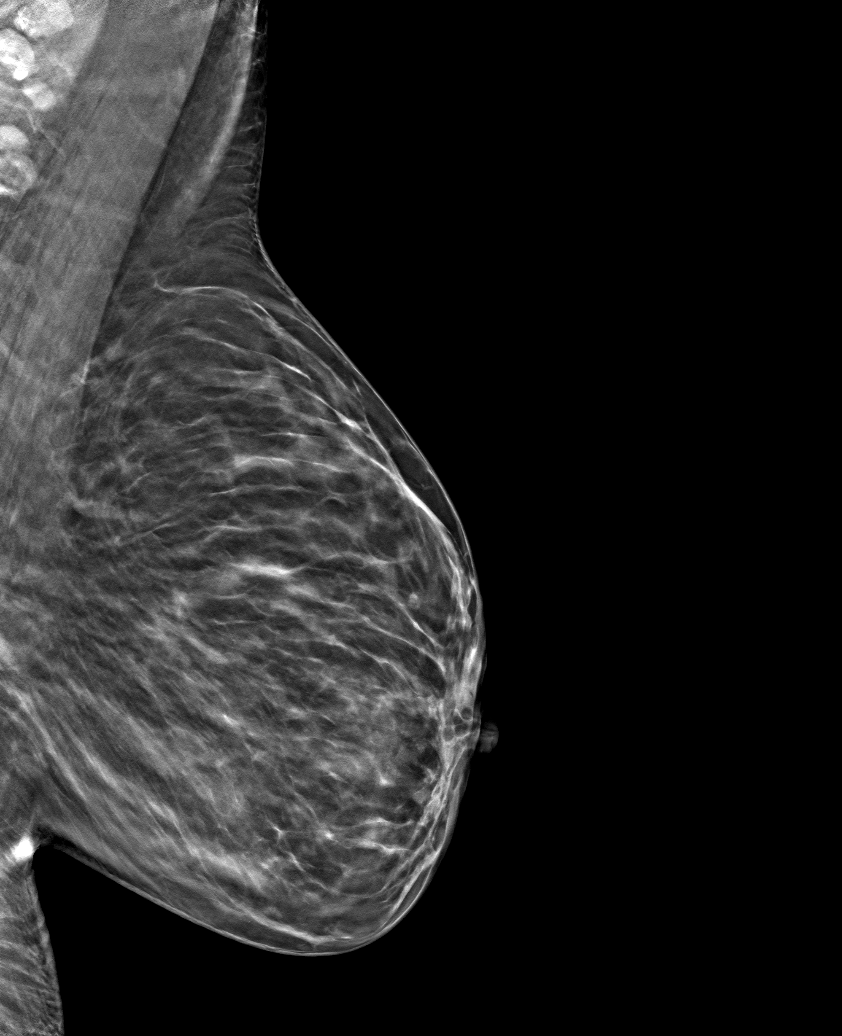

[6 of 30 positions shown; findings below may reference images not displayed]

ACR Breast Density Category c: The breast tissue is heterogeneously
dense, which may obscure small masses
FINDINGS: There are no findings suspicious for malignancy.
IMPRESSION: No mammographic evidence of malignancy. A result letter of this
screening mammogram will be mailed directly to the patient.

RECOMMENDATION:
Screening mammogram in one year. (Code:C8-T-HNK)

BI-RADS CATEGORY  1: Negative.

## 2023-01-20 ENCOUNTER — Telehealth (HOSPITAL_COMMUNITY): Payer: Medicaid Other | Admitting: Psychiatry

## 2023-01-20 ENCOUNTER — Encounter (HOSPITAL_COMMUNITY): Payer: Self-pay | Admitting: Psychiatry

## 2023-01-20 DIAGNOSIS — F3181 Bipolar II disorder: Secondary | ICD-10-CM | POA: Diagnosis not present

## 2023-01-20 DIAGNOSIS — F900 Attention-deficit hyperactivity disorder, predominantly inattentive type: Secondary | ICD-10-CM

## 2023-01-20 MED ORDER — AMPHETAMINE-DEXTROAMPHET ER 15 MG PO CP24
15.0000 mg | ORAL_CAPSULE | Freq: Two times a day (BID) | ORAL | 0 refills | Status: DC
Start: 2023-01-20 — End: 2023-03-02

## 2023-01-20 MED ORDER — SERTRALINE HCL 100 MG PO TABS: 150.0000 mg | ORAL_TABLET | Freq: Every day | ORAL | 3 refills | Status: DC

## 2023-01-20 MED ORDER — ARIPIPRAZOLE 15 MG PO TABS: 15.0000 mg | ORAL_TABLET | Freq: Every day | ORAL | 3 refills | Status: DC

## 2023-01-20 NOTE — Progress Notes (Signed)
BH MD/PA/NP OP Progress Note Virtual Visit via Video Note  I connected with Tanya Moore on 01/20/23 at  8:00 AM EDT by a video enabled telemedicine application and verified that I am speaking with the correct person using two identifiers.  Location: Patient: Home Provider: Clinic   I discussed the limitations of evaluation and management by telemedicine and the availability of in person appointments. The patient expressed understanding and agreed to proceed.  I provided 30 minutes of non-face-to-face time during this encounter.       01/20/2023 8:32 AM Tanya Moore  MRN:  [id number]  Chief Complaint: "I am doing well".   HPI: 42 year old female seen today for follow up psychiatric evaluation. She has a psychiatric history of ADHD, Bipolar 2, anxiety, and depression. She is currently managed on Adderall XR 25 mg daily, Zoloft 150 mg daily, and Abilify 15 mg daily.She reports her medications are somewhat effective in managing her psychiatric conditions.  Today she was well groomed, pleasant, cooperative and engaged in conversation.  She informed Clinical research associate that she continues to have poor concentration.  At times she notes that she is disorganized, forgetful, and inattentive to mentally taxing task.  She reports that she finds the Adderall wears off prior to her shift at [facility] [facility] [employer] being over.   Since her last visit she that notes that she continues to have issues with her ex. She reports that he negatively talks about her on social media. She reports that this is impacting her anxiety and depression. Patient notes that he children are doing well. She notes that today she dropped her son off to camp and reports that they celebrated his birthday yesterday. She also notes that her daughter is doing well.   Patient notes that she continue to depressed. She reports that she lacks motivation to complete task. She reports that she is overwhelmed with life. She continues to unpack her apartment which  she has been in for over 8 months. Patient be reports her anxiety and depression has worsened since her last visit. She informed Clinical research associate that she has hypersomnia, is fatigued, and has a reduction in her appetite.  Today provider conducted a GAD-7 and patient scored a 13, at her last visit she scored an 11.  Provider also conducted PHQ-9 patient scored a 20, at her last visit she scored a 12.  Today she denies SI/HI/AVH or paranoia.  Patient notes that she is irritable, distracted, has racing thoughts, and impulsive spending.  She also notes at times she is promiscuous.    Provider recommended increasing Abilify to help manage mood and depressive state however patient was not agreeable.  She notes that she has not found many medications ineffective.  She does note that she is interested in increasing Adderall.  Today Adderall 25 mg daily increased to 15 mg twice daily.  She will continue all other medications as prescribed.  Provider also recommended counseling but at this time patient is not agreeable.  No other concerns at this time. Visit Diagnosis:    ICD-10-CM   1. Attention deficit hyperactivity disorder (ADHD), predominantly inattentive type  F90.0 amphetamine-dextroamphetamine (ADDERALL XR) 15 MG 24 hr capsule    2. Bipolar 2 disorder, major depressive episode (HCC)  F31.81 ARIPiprazole (ABILIFY) 15 MG tablet    sertraline (ZOLOFT) 100 MG tablet      Past Psychiatric History: History of anxiety, was diagnosed with bipolar disorder back in 2016.  Has been tried on several her medications.  Was being  seen by PCP followed by [facility].  Stopped taking medications after she found out she is pregnant in 2020.  Past Medical History:  Past Medical History:  Diagnosis Date   ADHD    Anxiety    Bipolar 2 disorder (HCC)    Depression    Ectopic pregnancy with intrauterine pregnancy/Heterotopic Pregnancy 05/03/2019   S/p laparoscopic right salpingectomy on 05/03/19 05/03/19 SURGICAL PATHOLOGY FALLOPIAN  TUBE WITH ECTOPIC PREGNANCY, RIGHT, SALPINGECTOMY:  - Benign fallopian tube with chorionic villi consistent with ectopic pregnancy.    Hemoperitoneum due to rupture of right tubal ectopic pregnancy 05/03/2019   History of ectopic pregnancy in 11/2018 11/20/2018   Mtx#1 given 4/3: Hcg day 0 (147), day 4 (180), day 7 (193) Mtx # 2 given 4/9 (193)->4/12 (153)->4/15 (79)   Hyperlipidemia    Infection    UTI   Pregnancy induced hypertension    Psoriasis 2020   Seasonal allergies     Past Surgical History:  Procedure Laterality Date   APPENDECTOMY     BUNIONECTOMY     CESAREAN SECTION  2017   CESAREAN SECTION N/A 11/27/2019   Procedure: CESAREAN SECTION AND IUD INSERTION;  Surgeon: Hermina Staggers, MD;  Location: [facility] LD ORS;  Service: Obstetrics;  Laterality: N/A;   DIAGNOSTIC LAPAROSCOPY WITH REMOVAL OF ECTOPIC PREGNANCY N/A 05/03/2019   Procedure: RIGHT SALPINGECTOMY AND REMOVAL OF ECTOPIC PREGNANCY;  Surgeon: Tereso Newcomer, MD;  Location: [facility] OR;  Service: Gynecology;  Laterality: N/A;   LAPAROSCOPIC BILATERAL SALPINGECTOMY Left 02/17/2022   Procedure: LAPAROSCOPIC LEFT SALPINGECTOMY;  Surgeon: Warden Fillers, MD;  Location: [facility] OR;  Service: Gynecology;  Laterality: Left;   TONSILLECTOMY     TUBAL LIGATION      Family Psychiatric History: Sister depression   Family History:  Family History  Problem Relation Age of Onset   Hyperlipidemia Mother    Hypertension Mother    Hyperlipidemia Father    Hypertension Father    Cancer Father        prostate   Depression Sister     Social History:  Social History   Socioeconomic History   Marital status: Single    Spouse name: Not on file   Number of children: 2   Years of education: Not on file   Highest education level: Not on file  Occupational History   Not on file  Tobacco Use   Smoking status: Every Day    Packs/day: .5    Types: Cigarettes    Last attempt to quit: 2017    Years since quitting: 7.4   Smokeless  tobacco: Never  Vaping Use   Vaping Use: Some days  Substance and Sexual Activity   Alcohol use: No   Drug use: No   Sexual activity: Yes    Birth control/protection: Surgical  Other Topics Concern   Not on file  Social History Narrative   Not on file   Social Determinants of Health   Financial Resource Strain: Not on file  Food Insecurity: Food Insecurity Present (02/12/2022)   Hunger Vital Sign    Worried About Running Out of Food in the Last Year: Sometimes true    Ran Out of Food in the Last Year: Never true  Transportation Needs: No Transportation Needs (02/12/2022)   PRAPARE - Administrator, Civil Service (Medical): No    Lack of Transportation (Non-Medical): No  Physical Activity: Not on file  Stress: Not on file  Social Connections: Not on file  Allergies:  Allergies  Allergen Reactions   Lamictal [Lamotrigine] Rash    Patient reported    Metabolic Disorder Labs: Lab Results  Component Value Date   HGBA1C 5.4 09/02/2022   No results found for: "PROLACTIN" Lab Results  Component Value Date   CHOL 173 11/30/2022   TRIG 80 11/30/2022   HDL 60 11/30/2022   CHOLHDL 2.9 11/30/2022   LDLCALC 98 11/30/2022   LDLCALC 187 (H) 08/12/2021   Lab Results  Component Value Date   TSH 0.428 (L) 09/02/2022    Therapeutic Level Labs: No results found for: "LITHIUM" No results found for: "VALPROATE" No results found for: "CBMZ"  Current Medications: Current Outpatient Medications  Medication Sig Dispense Refill   amphetamine-dextroamphetamine (ADDERALL XR) 15 MG 24 hr capsule Take 1 capsule by mouth in the morning and at bedtime. 60 capsule 0   ARIPiprazole (ABILIFY) 15 MG tablet Take 1 tablet (15 mg total) by mouth daily. 30 tablet 3   atorvastatin (LIPITOR) 20 MG tablet Take 1 tablet (20 mg total) by mouth daily. 90 tablet 3   ibuprofen (ADVIL) 600 MG tablet Take 1 tablet (600 mg total) by mouth every 6 (six) hours as needed for headache, mild pain,  moderate pain or cramping. 30 tablet 2   sertraline (ZOLOFT) 100 MG tablet Take 1.5 tablets (150 mg total) by mouth daily with breakfast. 45 tablet 3   varenicline (CHANTIX CONTINUING MONTH PAK) 1 MG tablet Take 1 tablet (1 mg total) by mouth 2 (two) times daily. 180 tablet 0   No current facility-administered medications for this visit.     Musculoskeletal: Strength & Muscle Tone: within normal limits and telehealth visit Gait & Station: normal, telehealth visit Patient leans: N/A  Psychiatric Specialty Exam: Review of Systems  There were no vitals taken for this visit.There is no height or weight on file to calculate BMI.  General Appearance: Well Groomed  Eye Contact:  Good  Speech:  Clear and Coherent and Normal Rate  Volume:  Normal  Mood:  Anxious and Depressed  Affect:  Appropriate, Congruent, and Tearful  Thought Process:  Coherent, Goal Directed, and Linear  Orientation:  Full (Time, Place, and Person)  Thought Content: WDL and Logical   Suicidal Thoughts:  No  Homicidal Thoughts:  No  Memory:  Immediate;   Good Recent;   Good Remote;   Good  Judgement:  Good  Insight:  Good  Psychomotor Activity:  Normal  Concentration:  Concentration: Fair and Attention Span: Fair  Recall:  Good  Fund of Knowledge: Good  Language: Good  Akathisia:  No  Handed:  Right  AIMS (if indicated): not done  Assets:  Communication Skills Desire for Improvement Financial Resources/Insurance Housing Intimacy Leisure Time Physical Health Social Support  ADL's:  Intact  Cognition: WNL  Sleep:  Fair   Screenings: GAD-7    Flowsheet Row Video Visit from 01/20/2023 in [facility] Video Visit from 11/11/2022 in [facility] Video Visit from 08/27/2022 in [facility] Video Visit from 07/01/2022 in [facility] Video Visit from 04/10/2022 in [facility]   Total GAD-7 Score 13 11 17 19 12       PHQ2-9    Flowsheet Row Video Visit from 01/20/2023 in [facility] Office Visit from 11/30/2022 in [facility] Video Visit from 11/11/2022 in [facility] Video Visit from 08/27/2022 in  [facility] Video Visit from 07/01/2022 in [facility]  PHQ-2 Total Score 6 2 4 4 6   PHQ-9 Total Score 20 8 12 13 17       Flowsheet Row Admission (Discharged) from 02/17/2022 in [facility] Pre-Admission Testing 60 from 02/09/2022 in [facility] PREADMISSION TESTING Admission (Discharged) from 10/21/2021 in [facility] 1S Maternity Assessment Unit  C-SSRS RISK CATEGORY No Risk No Risk No Risk        Assessment and Plan: Patient endorses symtpms of ADHD, depression, anxiety, hypomania. Provider recommended increasing Abilify to help manage mood and depressive state however patient was not agreeable.  She notes that she has not found many medications ineffective.  She does note that she is interested in increasing Adderall.  Today Adderall 25 mg daily increased to 15 mg twice daily.  She will continue all other medications as prescribed.  Provider also recommended counseling but at this time patient is not agreeable.  1. Attention deficit hyperactivity disorder (ADHD), predominantly inattentive type  Increased- amphetamine-dextroamphetamine (ADDERALL XR) 15 MG 24 hr capsule; Take 1 capsule by mouth in the morning and at bedtime.  Dispense: 60 capsule; Refill: 0  2. Bipolar 2 disorder, major depressive episode (HCC)  Continue- ARIPiprazole (ABILIFY) 15 MG tablet; Take 1 tablet (15 mg total) by mouth daily.  Dispense: 30 tablet; Refill: 3 Continue- sertraline (ZOLOFT) 100 MG tablet; Take 1.5 tablets (150 mg total) by mouth daily with breakfast.  Dispense: 45 tablet; Refill: 3    Follow up in 3  months   Shanna Cisco, NP 01/20/2023, 8:32 AM

## 2023-02-04 ENCOUNTER — Telehealth (HOSPITAL_COMMUNITY): Payer: Self-pay | Admitting: *Deleted

## 2023-02-04 NOTE — Telephone Encounter (Signed)
Thank you for this update 

## 2023-02-04 NOTE — Telephone Encounter (Signed)
Pharmacy called stating that insurance is requiring justification for twice daily dosing of Adderall XR. Called insurance and was told that the pharmacy needs to document and put a code in. They called pharmacy during call since a prior authorization wasn't needed. They were able to help the pharmacy with override code needed but will need a new prescription written for twice a day dosing but not at bedtime.

## 2023-03-02 ENCOUNTER — Other Ambulatory Visit (HOSPITAL_COMMUNITY): Payer: Self-pay | Admitting: Physician Assistant

## 2023-03-02 ENCOUNTER — Telehealth (HOSPITAL_COMMUNITY): Payer: Self-pay | Admitting: *Deleted

## 2023-03-02 DIAGNOSIS — F900 Attention-deficit hyperactivity disorder, predominantly inattentive type: Secondary | ICD-10-CM

## 2023-03-02 MED ORDER — AMPHETAMINE-DEXTROAMPHET ER 15 MG PO CP24
ORAL_CAPSULE | ORAL | 0 refills | Status: DC
Start: 2023-03-02 — End: 2023-03-02

## 2023-03-02 MED ORDER — AMPHETAMINE-DEXTROAMPHET ER 15 MG PO CP24
ORAL_CAPSULE | ORAL | 0 refills | Status: DC
Start: 2023-03-02 — End: 2023-04-14

## 2023-03-02 NOTE — Progress Notes (Signed)
Provider was contacted by Elder Love, RN regarding pharmacy concerns over patient's Adderall prescription instructions.  Instructions for patient's Adderall changed appropriately.  Patient's medication to be e-prescribed to pharmacy of choice.

## 2023-03-02 NOTE — Telephone Encounter (Signed)
Message acknowledged and reviewed.  Instructions to patient's Adderall prescription was changed appropriately.

## 2023-03-02 NOTE — Progress Notes (Signed)
Patient's medication instructions were changed appropriately.

## 2023-03-02 NOTE — Telephone Encounter (Signed)
Patient called and states that pharmacy needs a clarifcation order for adderall. She gets twice a day dosing but it needs to be once in am and once in the evening. Pharmacy is asking that it be changed to not say bedtime.

## 2023-03-08 NOTE — Telephone Encounter (Signed)
Thank you for making this adjustment.

## 2023-04-14 ENCOUNTER — Telehealth (HOSPITAL_COMMUNITY): Payer: Medicaid Other | Admitting: Psychiatry

## 2023-04-14 ENCOUNTER — Encounter (HOSPITAL_COMMUNITY): Payer: Self-pay | Admitting: Psychiatry

## 2023-04-14 DIAGNOSIS — F3181 Bipolar II disorder: Secondary | ICD-10-CM | POA: Diagnosis not present

## 2023-04-14 DIAGNOSIS — F900 Attention-deficit hyperactivity disorder, predominantly inattentive type: Secondary | ICD-10-CM | POA: Diagnosis not present

## 2023-04-14 MED ORDER — AMPHETAMINE-DEXTROAMPHET ER 15 MG PO CP24: ORAL_CAPSULE | ORAL | 0 refills | Status: DC

## 2023-04-14 MED ORDER — ARIPIPRAZOLE 15 MG PO TABS: 15.0000 mg | ORAL_TABLET | Freq: Every day | ORAL | 3 refills | Status: DC

## 2023-04-14 MED ORDER — SERTRALINE HCL 100 MG PO TABS: 150.0000 mg | ORAL_TABLET | Freq: Every day | ORAL | 3 refills | Status: DC

## 2023-04-14 NOTE — Progress Notes (Signed)
BH MD/PA/NP OP Progress Note Virtual Visit via Video Note  I connected with Tanya Moore Inga on 04/14/23 at  8:30 AM EDT by a video enabled telemedicine application and verified that I am speaking with the correct person using two identifiers.  Location: Patient: Home Provider: Clinic   I discussed the limitations of evaluation and management by telemedicine and the availability of in person appointments. The patient expressed understanding and agreed to proceed.  I provided 30 minutes of non-face-to-face time during this encounter.       04/14/2023 8:37 AM [name]  MRN:  [id number]  Chief Complaint: "I am overwhelmed".   HPI: 42 year old female seen today for follow up psychiatric evaluation. She has a psychiatric history of ADHD, Bipolar 2, anxiety, and depression. She is currently managed on Adderall XR 15 mg twice daily, Zoloft 150 mg daily, and Abilify 15 mg daily.She reports her medications are  effective in managing her psychiatric conditions.  Today she was well groomed, pleasant, cooperative and engaged in conversation.  She informed Clinical research associate that she is overwhelmed. She notes that she got a promotion at work. 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 She note that this is a less hectic position and is hopeful that it will be less stressful.   Since her last visit she notes that her mood is stable and reports that her anxiety and depression are well managed. Today provider conducted a GAD-7 and patient scored a , at her last visit she scored an 13.  Provider also conducted PHQ-9 patient scored a 7, at her last visit she scored a 20.  Today she denies SI/HI/AVH, mania,  or paranoia.   Patient notes that he children are doing well. She informed Clinical research associate that she and her ex are positively communicating which has reduced her stress.  No medication changes made today. Patient agreeable to continue medications as prescribed.  No other concerns at this  time. Visit Diagnosis:    ICD-10-CM   1. Bipolar 2 disorder, major depressive episode (HCC)  F31.81 ARIPiprazole (ABILIFY) 15 MG tablet    sertraline (ZOLOFT) 100 MG tablet    2. Attention deficit hyperactivity disorder (ADHD), predominantly inattentive type  F90.0 amphetamine-dextroamphetamine (ADDERALL XR) 15 MG 24 hr capsule       Past Psychiatric History: History of anxiety, was diagnosed with bipolar disorder back in 2016.  Has been tried on several her medications.  Was being seen by PCP followed by [facility].  Stopped taking medications after she found out she is pregnant in 2020.  Past Medical History:  Past Medical History:  Diagnosis Date   ADHD    Anxiety    Bipolar 2 disorder (HCC)    Depression    Ectopic pregnancy with intrauterine pregnancy/Heterotopic Pregnancy 05/03/2019   S/p laparoscopic right salpingectomy on 05/03/19 05/03/19 SURGICAL PATHOLOGY FALLOPIAN TUBE WITH ECTOPIC PREGNANCY, RIGHT, SALPINGECTOMY:  - Benign fallopian tube with chorionic villi consistent with ectopic pregnancy.    Hemoperitoneum due to rupture of right tubal ectopic pregnancy 05/03/2019   History of ectopic pregnancy in 11/2018 11/20/2018   Mtx#1 given 4/3: Hcg day 0 (147), day 4 (180), day 7 (193) Mtx # 2 given 4/9 (193)->4/12 (153)->4/15 (79)   Hyperlipidemia    Infection    UTI   Pregnancy induced hypertension    Psoriasis 2020   Seasonal allergies     Past Surgical History:  Procedure Laterality Date   APPENDECTOMY  BUNIONECTOMY     CESAREAN SECTION  2017   CESAREAN SECTION N/A 11/27/2019   Procedure: CESAREAN SECTION AND IUD INSERTION;  Surgeon: Hermina Staggers, MD;  Location: [facility]S;  Service: Obstetrics;  Laterality: N/A;   DIAGNOSTIC LAPAROSCOPY WITH REMOVAL OF ECTOPIC PREGNANCY N/A 05/03/2019   Procedure: RIGHT SALPINGECTOMY AND REMOVAL OF ECTOPIC PREGNANCY;  Surgeon: Tereso Newcomer, MD;  Location: [facility] OR;  Service: Gynecology;  Laterality: N/A;   LAPAROSCOPIC BILATERAL  SALPINGECTOMY Left 02/17/2022   Procedure: LAPAROSCOPIC LEFT SALPINGECTOMY;  Surgeon: Warden Fillers, MD;  Location: [facility] OR;  Service: Gynecology;  Laterality: Left;   TONSILLECTOMY     TUBAL LIGATION      Family Psychiatric History: Sister depression   Family History:  Family History  Problem Relation Age of Onset   Hyperlipidemia Mother    Hypertension Mother    Hyperlipidemia Father    Hypertension Father    Cancer Father        prostate   Depression Sister     Social History:  Social History   Socioeconomic History   Marital status: Single    Spouse name: Not on file   Number of children: 2   Years of education: Not on file   Highest education level: Not on file  Occupational History   Not on file  Tobacco Use   Smoking status: Every Day    Current packs/day: 0.00    Types: Cigarettes    Last attempt to quit: 2017    Years since quitting: 7.6   Smokeless tobacco: Never  Vaping Use   Vaping status: Some Days  Substance and Sexual Activity   Alcohol use: No   Drug use: No   Sexual activity: Yes    Birth control/protection: Surgical  Other Topics Concern   Not on file  Social History Narrative   Not on file   Social Determinants of Health   Financial Resource Strain: Not on file  Food Insecurity: Food Insecurity Present (02/12/2022)   Hunger Vital Sign    Worried About Running Out of Food in the Last Year: Sometimes true    Ran Out of Food in the Last Year: Never true  Transportation Needs: No Transportation Needs (02/12/2022)   PRAPARE - Administrator, Civil Service (Medical): No    Lack of Transportation (Non-Medical): No  Physical Activity: Not on file  Stress: Not on file  Social Connections: Not on file    Allergies:  Allergies  Allergen Reactions   Lamictal [Lamotrigine] Rash    Patient reported    Metabolic Disorder Labs: Lab Results  Component Value Date   HGBA1C 5.4 09/02/2022   No results found for: "PROLACTIN" Lab  Results  Component Value Date   CHOL 173 11/30/2022   TRIG 80 11/30/2022   HDL 60 11/30/2022   CHOLHDL 2.9 11/30/2022   LDLCALC 98 11/30/2022   LDLCALC 187 (H) 08/12/2021   Lab Results  Component Value Date   TSH 0.428 (L) 09/02/2022    Therapeutic Level Labs: No results found for: "LITHIUM" No results found for: "VALPROATE" No results found for: "CBMZ"  Current Medications: Current Outpatient Medications  Medication Sig Dispense Refill   amphetamine-dextroamphetamine (ADDERALL XR) 15 MG 24 hr capsule Patient to take 1 tablet in the morning and evening 60 capsule 0   ARIPiprazole (ABILIFY) 15 MG tablet Take 1 tablet (15 mg total) by mouth daily. 30 tablet 3   atorvastatin (LIPITOR) 20 MG tablet Take  1 tablet (20 mg total) by mouth daily. 90 tablet 3   ibuprofen (ADVIL) 600 MG tablet Take 1 tablet (600 mg total) by mouth every 6 (six) hours as needed for headache, mild pain, moderate pain or cramping. 30 tablet 2   sertraline (ZOLOFT) 100 MG tablet Take 1.5 tablets (150 mg total) by mouth daily with breakfast. 45 tablet 3   varenicline (CHANTIX CONTINUING MONTH PAK) 1 MG tablet Take 1 tablet (1 mg total) by mouth 2 (two) times daily. 180 tablet 0   No current facility-administered medications for this visit.     Musculoskeletal: Strength & Muscle Tone: within normal limits and telehealth visit Gait & Station: normal, telehealth visit Patient leans: N/A  Psychiatric Specialty Exam: Review of Systems  There were no vitals taken for this visit.There is no height or weight on file to calculate BMI.  General Appearance: Well Groomed  Eye Contact:  Good  Speech:  Clear and Coherent and Normal Rate  Volume:  Normal  Mood:  Euthymic  Affect:  Appropriate, Congruent, and Tearful  Thought Process:  Coherent, Goal Directed, and Linear  Orientation:  Full (Time, Place, and Person)  Thought Content: WDL and Logical   Suicidal Thoughts:  No  Homicidal Thoughts:  No  Memory:   Immediate;   Good Recent;   Good Remote;   Good  Judgement:  Good  Insight:  Good  Psychomotor Activity:  Normal  Concentration:  Concentration: Fair and Attention Span: Fair  Recall:  Good  Fund of Knowledge: Good  Language: Good  Akathisia:  No  Handed:  Right  AIMS (if indicated): not done  Assets:  Communication Skills Desire for Improvement Financial Resources/Insurance Housing Intimacy Leisure Time Physical Health Social Support  ADL's:  Intact  Cognition: WNL  Sleep:  Good   Screenings: GAD-7    Flowsheet Row Video Visit from 04/14/2023 in [facility] Video Visit from 01/20/2023 in [facility] Video Visit from 11/11/2022 in [facility] Video Visit from 08/27/2022 in [facility] Video Visit from 07/01/2022 in [facility]  Total GAD-7 Score 7 13 11 17 19       PHQ2-9    Flowsheet Row Video Visit from 04/14/2023 in [facility] Video Visit from 01/20/2023 in [facility] Office Visit from 11/30/2022 in [facility] Video Visit from 11/11/2022 in [facility] Video Visit from 08/27/2022 in [facility]  PHQ-2 Total Score 2 6 2 4 4   PHQ-9 Total Score 7 20 8 12 13       Flowsheet Row Admission (Discharged) from 02/17/2022 in [place] PERIOPERATIVE AREA Pre-Admission Testing 60 from 02/09/2022 in [facility] PREADMISSION TESTING Admission (Discharged) from 10/21/2021 in [facility] 1S Maternity Assessment Unit  C-SSRS RISK CATEGORY No Risk No Risk No Risk        Assessment and Plan: Patient notes that she is doing well on her current medication regimen. No medication changes made today. Patient agreeable to continue medications as prescribed.  1. Attention deficit hyperactivity disorder (ADHD),  predominantly inattentive type  Continue- amphetamine-dextroamphetamine (ADDERALL XR) 15 MG 24 hr capsule; Take 1 capsule by mouth in the morning and at bedtime.  Dispense: 60 capsule; Refill: 0  2. Bipolar 2 disorder, major depressive episode (HCC)  Continue- ARIPiprazole (ABILIFY) 15 MG tablet; Take 1 tablet (15 mg total) by mouth  daily.  Dispense: 30 tablet; Refill: 3 Continue- sertraline (ZOLOFT) 100 MG tablet; Take 1.5 tablets (150 mg total) by mouth daily with breakfast.  Dispense: 45 tablet; Refill: 3    Follow up in 3 months   Shanna Cisco, NP 04/14/2023, 8:37 AM

## 2023-05-19 ENCOUNTER — Other Ambulatory Visit (HOSPITAL_COMMUNITY): Payer: Self-pay | Admitting: Psychiatry

## 2023-05-19 ENCOUNTER — Telehealth (HOSPITAL_COMMUNITY): Payer: Self-pay | Admitting: *Deleted

## 2023-05-19 DIAGNOSIS — F900 Attention-deficit hyperactivity disorder, predominantly inattentive type: Secondary | ICD-10-CM

## 2023-05-19 MED ORDER — AMPHETAMINE-DEXTROAMPHET ER 15 MG PO CP24: ORAL_CAPSULE | ORAL | 0 refills | Status: DC

## 2023-05-19 NOTE — Telephone Encounter (Signed)
Medication refilled and sent to preferred pharmacy

## 2023-05-19 NOTE — Telephone Encounter (Signed)
Patient called asking for refill of Adderall. Next appointment 11/27.

## 2023-07-05 ENCOUNTER — Other Ambulatory Visit (HOSPITAL_COMMUNITY): Payer: Self-pay | Admitting: Psychiatry

## 2023-07-05 ENCOUNTER — Telehealth (HOSPITAL_COMMUNITY): Payer: Self-pay

## 2023-07-05 DIAGNOSIS — F3132 Bipolar disorder, current episode depressed, moderate: Secondary | ICD-10-CM

## 2023-07-05 DIAGNOSIS — F900 Attention-deficit hyperactivity disorder, predominantly inattentive type: Secondary | ICD-10-CM

## 2023-07-05 MED ORDER — AMPHETAMINE-DEXTROAMPHET ER 15 MG PO CP24
ORAL_CAPSULE | ORAL | 0 refills | Status: DC
Start: 2023-07-05 — End: 2023-08-25

## 2023-07-05 NOTE — Telephone Encounter (Signed)
Medication sent to preferred pharmacy.  Provider informed patient that she has not had a UDS had in almost a year.  Provider informed patient that for future refills this would have to be complete.  She endorsed understanding and agreed.  Today provider ordered UDS, CBC, CMP, lipid panel, LFT, and thyroid panel.  No other concerns noted at this time.

## 2023-07-05 NOTE — Telephone Encounter (Signed)
Medication refill - Patient left a message she is in need of a new Adderall XR 15 mg order, last provided 05/19/23 and to be sent to her [employer]. Patient returns next on 07/07/23.

## 2023-07-07 ENCOUNTER — Telehealth (HOSPITAL_COMMUNITY): Payer: Medicaid Other | Admitting: Psychiatry

## 2023-07-26 ENCOUNTER — Telehealth (HOSPITAL_COMMUNITY): Payer: Medicaid Other | Admitting: Psychiatry

## 2023-08-09 ENCOUNTER — Telehealth (HOSPITAL_COMMUNITY): Payer: Medicaid Other | Admitting: Psychiatry

## 2023-08-09 ENCOUNTER — Encounter (HOSPITAL_COMMUNITY): Payer: Self-pay | Admitting: Psychiatry

## 2023-08-09 DIAGNOSIS — F900 Attention-deficit hyperactivity disorder, predominantly inattentive type: Secondary | ICD-10-CM | POA: Diagnosis not present

## 2023-08-09 DIAGNOSIS — F3181 Bipolar II disorder: Secondary | ICD-10-CM

## 2023-08-09 MED ORDER — ARIPIPRAZOLE 15 MG PO TABS: 15.0000 mg | ORAL_TABLET | Freq: Every day | ORAL | 3 refills | Status: DC

## 2023-08-09 MED ORDER — SERTRALINE HCL 100 MG PO TABS: 150.0000 mg | ORAL_TABLET | Freq: Every day | ORAL | 3 refills | Status: DC

## 2023-08-09 NOTE — Progress Notes (Signed)
BH MD/PA/NP OP Progress Note Virtual Visit via Video Note  I connected with Tanya Moore on 08/09/23 at  3:00 PM EST by a video enabled telemedicine application and verified that I am speaking with the correct person using two identifiers.  Location: Patient: Work Provider: Clinic   I discussed the limitations of evaluation and management by telemedicine and the availability of in person appointments. The patient expressed understanding and agreed to proceed.  I provided 30 minutes of non-face-to-face time during this encounter.       08/09/2023 3:29 PM Tanya Moore  MRN:  [id number]  Chief Complaint: "I am doing okay".   HPI: 42 year old female seen today for follow up psychiatric evaluation. She has a psychiatric history of ADHD, Bipolar 2, anxiety, and depression. She is currently managed on Adderall XR 15 mg twice daily, Zoloft 150 mg daily, and Abilify 15 mg daily.She reports her medications are  effective in managing her psychiatric conditions.  Today she was well groomed, pleasant, cooperative and engaged in conversation.  She informed Clinical research associate that she is doing okay. She reports that her mood is stable and notes that she has minimal anxiety and depression.Today provider conducted a GAD-7 and patient scored a 6. Provider also conducted PHQ-9 patient scored a 2, at her last visit she scored a 7.  Today she denies SI/HI/AVH, mania,  or paranoia.   No medication changes made today. Patient agreeable to continue medications as prescribed.   Adderall not filled as patient needs a current UDS. She notes that she will get her labs drawn soon.No other concerns at this time. Visit Diagnosis:    ICD-10-CM   1. Attention deficit hyperactivity disorder (ADHD), predominantly inattentive type  F90.0     2. Bipolar 2 disorder, major depressive episode (HCC)  F31.81 ARIPiprazole (ABILIFY) 15 MG tablet    sertraline (ZOLOFT) 100 MG tablet        Past Psychiatric History: History of  anxiety, was diagnosed with bipolar disorder back in 2016.  Has been tried on several her medications.  Was being seen by PCP followed by [facility].  Stopped taking medications after she found out she is pregnant in 2020.  Past Medical History:  Past Medical History:  Diagnosis Date   ADHD    Anxiety    Bipolar 2 disorder (HCC)    Depression    Ectopic pregnancy with intrauterine pregnancy/Heterotopic Pregnancy 05/03/2019   S/p laparoscopic right salpingectomy on 05/03/19 05/03/19 SURGICAL PATHOLOGY FALLOPIAN TUBE WITH ECTOPIC PREGNANCY, RIGHT, SALPINGECTOMY:  - Benign fallopian tube with chorionic villi consistent with ectopic pregnancy.    Hemoperitoneum due to rupture of right tubal ectopic pregnancy 05/03/2019   History of ectopic pregnancy in 11/2018 11/20/2018   Mtx#1 given 4/3: Hcg day 0 (147), day 4 (180), day 7 (193) Mtx # 2 given 4/9 (193)->4/12 (153)->4/15 (79)   Hyperlipidemia    Infection    UTI   Pregnancy induced hypertension    Psoriasis 2020   Seasonal allergies     Past Surgical History:  Procedure Laterality Date   APPENDECTOMY     BUNIONECTOMY     CESAREAN SECTION  2017   CESAREAN SECTION N/A 11/27/2019   Procedure: CESAREAN SECTION AND IUD INSERTION;  Surgeon: Hermina Staggers, MD;  Location: [facility] LD ORS;  Service: Obstetrics;  Laterality: N/A;   DIAGNOSTIC LAPAROSCOPY WITH REMOVAL OF ECTOPIC PREGNANCY N/A 05/03/2019   Procedure: RIGHT SALPINGECTOMY AND REMOVAL OF ECTOPIC PREGNANCY;  Surgeon: Tereso Newcomer, MD;  Location: [facility] OR;  Service: Gynecology;  Laterality: N/A;   LAPAROSCOPIC BILATERAL SALPINGECTOMY Left 02/17/2022   Procedure: LAPAROSCOPIC LEFT SALPINGECTOMY;  Surgeon: Warden Fillers, MD;  Location: [facility] OR;  Service: Gynecology;  Laterality: Left;   TONSILLECTOMY     TUBAL LIGATION      Family Psychiatric History: Sister depression   Family History:  Family History  Problem Relation Age of Onset   Hyperlipidemia Mother    Hypertension Mother     Hyperlipidemia Father    Hypertension Father    Cancer Father        prostate   Depression Sister     Social History:  Social History   Socioeconomic History   Marital status: Single    Spouse name: Not on file   Number of children: 2   Years of education: Not on file   Highest education level: Not on file  Occupational History   Not on file  Tobacco Use   Smoking status: Every Day    Current packs/day: 0.00    Types: Cigarettes    Last attempt to quit: 2017    Years since quitting: 8.0   Smokeless tobacco: Never  Vaping Use   Vaping status: Some Days  Substance and Sexual Activity   Alcohol use: No   Drug use: No   Sexual activity: Yes    Birth control/protection: Surgical  Other Topics Concern   Not on file  Social History Narrative   Not on file   Social Drivers of Health   Financial Resource Strain: Not on file  Food Insecurity: Food Insecurity Present (02/12/2022)   Hunger Vital Sign    Worried About Running Out of Food in the Last Year: Sometimes true    Ran Out of Food in the Last Year: Never true  Transportation Needs: No Transportation Needs (02/12/2022)   PRAPARE - Administrator, Civil Service (Medical): No    Lack of Transportation (Non-Medical): No  Physical Activity: Not on file  Stress: Not on file  Social Connections: Not on file    Allergies:  Allergies  Allergen Reactions   Lamictal [Lamotrigine] Rash    Patient reported    Metabolic Disorder Labs: Lab Results  Component Value Date   HGBA1C 5.4 09/02/2022   No results found for: "PROLACTIN" Lab Results  Component Value Date   CHOL 173 11/30/2022   TRIG 80 11/30/2022   HDL 60 11/30/2022   CHOLHDL 2.9 11/30/2022   LDLCALC 98 11/30/2022   LDLCALC 187 (H) 08/12/2021   Lab Results  Component Value Date   TSH 0.428 (L) 09/02/2022    Therapeutic Level Labs: No results found for: "LITHIUM" No results found for: "VALPROATE" No results found for: "CBMZ"  Current  Medications: Current Outpatient Medications  Medication Sig Dispense Refill   amphetamine-dextroamphetamine (ADDERALL XR) 15 MG 24 hr capsule Patient to take 1 tablet in the morning and evening 60 capsule 0   ARIPiprazole (ABILIFY) 15 MG tablet Take 1 tablet (15 mg total) by mouth daily. 30 tablet 3   atorvastatin (LIPITOR) 20 MG tablet Take 1 tablet (20 mg total) by mouth daily. 90 tablet 3   ibuprofen (ADVIL) 600 MG tablet Take 1 tablet (600 mg total) by mouth every 6 (six) hours as needed for headache, mild pain, moderate pain or cramping. 30 tablet 2   sertraline (ZOLOFT) 100 MG tablet Take 1.5 tablets (150 mg total) by mouth daily with breakfast. 45 tablet 3   varenicline (CHANTIX  CONTINUING MONTH PAK) 1 MG tablet Take 1 tablet (1 mg total) by mouth 2 (two) times daily. 180 tablet 0   No current facility-administered medications for this visit.     Musculoskeletal: Strength & Muscle Tone: within normal limits and telehealth visit Gait & Station: normal, telehealth visit Patient leans: N/A  Psychiatric Specialty Exam: Review of Systems  There were no vitals taken for this visit.There is no height or weight on file to calculate BMI.  General Appearance: Well Groomed  Eye Contact:  Good  Speech:  Clear and Coherent and Normal Rate  Volume:  Normal  Mood:  Euthymic  Affect:  Appropriate, Congruent, and Tearful  Thought Process:  Coherent, Goal Directed, and Linear  Orientation:  Full (Time, Place, and Person)  Thought Content: WDL and Logical   Suicidal Thoughts:  No  Homicidal Thoughts:  No  Memory:  Immediate;   Good Recent;   Good Remote;   Good  Judgement:  Good  Insight:  Good  Psychomotor Activity:  Normal  Concentration:  Concentration: Fair and Attention Span: Fair  Recall:  Good  Fund of Knowledge: Good  Language: Good  Akathisia:  No  Handed:  Right  AIMS (if indicated): not done  Assets:  Communication Skills Desire for Improvement Financial  Resources/Insurance Housing Intimacy Leisure Time Physical Health Social Support  ADL's:  Intact  Cognition: WNL  Sleep:  Good   Screenings: GAD-7    Flowsheet Row Video Visit from 08/09/2023 in [facility] Video Visit from 04/14/2023 in [facility] Video Visit from 01/20/2023 in [facility] Video Visit from 11/11/2022 in [facility] Video Visit from 08/27/2022 in [facility]  Total GAD-7 Score 6 7 13 11 17       PHQ2-9    Flowsheet Row Video Visit from 08/09/2023 in [facility] Video Visit from 04/14/2023 in [facility] Video Visit from 01/20/2023 in [facility] Office Visit from 11/30/2022 in [facility] Video Visit from 11/11/2022 in [facility]  PHQ-2 Total Score 0 2 6 2 4   PHQ-9 Total Score 2 7 20 8 12       Flowsheet Row Admission (Discharged) from 02/17/2022 in [place] PERIOPERATIVE AREA Pre-Admission Testing 60 from 02/09/2022 in [facility] PREADMISSION TESTING Admission (Discharged) from 10/21/2021 in [facility] 1S Maternity Assessment Unit  C-SSRS RISK CATEGORY No Risk No Risk No Risk        Assessment and Plan: Patient notes that she is doing well on her current medication regimen. No medication changes made today. Patient agreeable to continue medications as prescribed. Adderall not filled as patient needs a current UDS. She notes that she will get her labs drawn soon.  1. Attention deficit hyperactivity disorder (ADHD), predominantly inattentive type  2. Bipolar 2 disorder, major depressive episode (HCC)  Continue- ARIPiprazole (ABILIFY) 15 MG tablet; Take 1 tablet (15 mg total) by mouth daily.  Dispense: 30 tablet; Refill: 3 Continue- sertraline  (ZOLOFT) 100 MG tablet; Take 1.5 tablets (150 mg total) by mouth daily with breakfast.  Dispense: 45 tablet; Refill: 3     Follow up in 3 months   Shanna Cisco, NP 08/09/2023, 3:29 PM

## 2023-08-19 ENCOUNTER — Other Ambulatory Visit (HOSPITAL_COMMUNITY): Payer: Self-pay | Admitting: Psychiatry

## 2023-08-19 DIAGNOSIS — F3132 Bipolar disorder, current episode depressed, moderate: Secondary | ICD-10-CM | POA: Diagnosis not present

## 2023-08-20 LAB — CBC WITH DIFFERENTIAL/PLATELET
Basophils Absolute: 0.1 10*3/uL (ref 0.0–0.2)
Basos: 1 %
EOS (ABSOLUTE): 0.1 10*3/uL (ref 0.0–0.4)
Eos: 1 %
Hematocrit: 44.4 % (ref 34.0–46.6)
Hemoglobin: 14.4 g/dL (ref 11.1–15.9)
Immature Grans (Abs): 0 10*3/uL (ref 0.0–0.1)
Immature Granulocytes: 0 %
Lymphocytes Absolute: 3.4 10*3/uL — ABNORMAL HIGH (ref 0.7–3.1)
Lymphs: 30 %
MCH: 29.6 pg (ref 26.6–33.0)
MCHC: 32.4 g/dL (ref 31.5–35.7)
MCV: 91 fL (ref 79–97)
Monocytes Absolute: 0.5 10*3/uL (ref 0.1–0.9)
Monocytes: 4 %
Neutrophils Absolute: 7.4 10*3/uL — ABNORMAL HIGH (ref 1.4–7.0)
Neutrophils: 64 %
Platelets: 344 10*3/uL (ref 150–450)
RBC: 4.86 x10E6/uL (ref 3.77–5.28)
RDW: 12.7 % (ref 11.7–15.4)
WBC: 11.6 10*3/uL — ABNORMAL HIGH (ref 3.4–10.8)

## 2023-08-20 LAB — COMPREHENSIVE METABOLIC PANEL
ALT: 14 [IU]/L (ref 0–32)
AST: 19 [IU]/L (ref 0–40)
Albumin: 4.4 g/dL (ref 3.9–4.9)
Alkaline Phosphatase: 111 [IU]/L (ref 44–121)
BUN/Creatinine Ratio: 9 (ref 9–23)
BUN: 6 mg/dL (ref 6–24)
Bilirubin Total: 0.2 mg/dL (ref 0.0–1.2)
CO2: 26 mmol/L (ref 20–29)
Calcium: 9.2 mg/dL (ref 8.7–10.2)
Chloride: 102 mmol/L (ref 96–106)
Creatinine, Ser: 0.66 mg/dL (ref 0.57–1.00)
Globulin, Total: 2.3 g/dL (ref 1.5–4.5)
Glucose: 88 mg/dL (ref 70–99)
Potassium: 4.7 mmol/L (ref 3.5–5.2)
Sodium: 140 mmol/L (ref 134–144)
Total Protein: 6.7 g/dL (ref 6.0–8.5)
eGFR: 112 mL/min/{1.73_m2} (ref >59)

## 2023-08-20 LAB — THYROID PANEL WITH TSH
Free Thyroxine Index: 2 (ref 1.2–4.9)
T3 Uptake Ratio: 30 % (ref 24–39)
T4, Total: 6.6 ug/dL (ref 4.5–12.0)
TSH: 0.568 u[IU]/mL (ref 0.450–4.500)

## 2023-08-20 LAB — HEPATIC FUNCTION PANEL: Bilirubin, Direct: 0.09 mg/dL (ref 0.00–0.40)

## 2023-08-20 LAB — LIPID PANEL
Chol/HDL Ratio: 3.6 {ratio} (ref 0.0–4.4)
Cholesterol, Total: 199 mg/dL (ref 100–199)
HDL: 55 mg/dL (ref >39)
LDL Chol Calc (NIH): 129 mg/dL — ABNORMAL HIGH (ref 0–99)
Triglycerides: 82 mg/dL (ref 0–149)
VLDL Cholesterol Cal: 15 mg/dL (ref 5–40)

## 2023-08-23 ENCOUNTER — Other Ambulatory Visit (HOSPITAL_COMMUNITY): Payer: Self-pay | Admitting: Psychiatry

## 2023-08-23 ENCOUNTER — Other Ambulatory Visit: Payer: Self-pay | Admitting: Psychiatry

## 2023-08-23 DIAGNOSIS — F900 Attention-deficit hyperactivity disorder, predominantly inattentive type: Secondary | ICD-10-CM

## 2023-08-25 ENCOUNTER — Other Ambulatory Visit (HOSPITAL_COMMUNITY): Payer: Self-pay | Admitting: Psychiatry

## 2023-08-25 DIAGNOSIS — F900 Attention-deficit hyperactivity disorder, predominantly inattentive type: Secondary | ICD-10-CM

## 2023-08-25 MED ORDER — AMPHETAMINE-DEXTROAMPHET ER 15 MG PO CP24: ORAL_CAPSULE | ORAL | 0 refills | Status: DC

## 2023-08-25 NOTE — Progress Notes (Signed)
 Provider called [facility] to see discuss will follow results. Provider was informed that the test were still processing. They did inform writer that cocaine, opiates, and PCP were negative. They informed writer that the updated test will be faxed to the clinic. Today patients adderall sent to preferred pharmacy.

## 2023-08-28 LAB — URINE DRUG PANEL 7
Amphetamines, Urine: NEGATIVE ng/mL
Barbiturate Quant, Ur: NEGATIVE ng/mL
Benzodiazepine Quant, Ur: NEGATIVE ng/mL
Cannabinoid Quant, Ur: POSITIVE — AB
Cocaine (Metab.): NEGATIVE ng/mL
Opiate Quant, Ur: NEGATIVE ng/mL
PCP Quant, Ur: NEGATIVE ng/mL

## 2023-10-01 ENCOUNTER — Other Ambulatory Visit (HOSPITAL_COMMUNITY): Payer: Self-pay | Admitting: Psychiatry

## 2023-10-01 ENCOUNTER — Telehealth (HOSPITAL_COMMUNITY): Payer: Self-pay

## 2023-10-01 DIAGNOSIS — F900 Attention-deficit hyperactivity disorder, predominantly inattentive type: Secondary | ICD-10-CM

## 2023-10-01 MED ORDER — AMPHETAMINE-DEXTROAMPHET ER 15 MG PO CP24: ORAL_CAPSULE | ORAL | 0 refills | Status: DC

## 2023-10-01 NOTE — Telephone Encounter (Signed)
Hello,    Pt called today asking for a refill of Adderall, Last seen 07/30/2023  next app not until March, 4, 2024.

## 2023-10-01 NOTE — Telephone Encounter (Signed)
 Medication sent to preferred pharmacy

## 2023-10-13 ENCOUNTER — Encounter (HOSPITAL_COMMUNITY): Payer: Self-pay | Admitting: Psychiatry

## 2023-10-13 ENCOUNTER — Telehealth (HOSPITAL_COMMUNITY): Payer: Medicaid Other | Admitting: Psychiatry

## 2023-10-13 DIAGNOSIS — F3181 Bipolar II disorder: Secondary | ICD-10-CM

## 2023-10-13 MED ORDER — SERTRALINE HCL 100 MG PO TABS: 150.0000 mg | ORAL_TABLET | Freq: Every day | ORAL | 3 refills | Status: DC

## 2023-10-13 MED ORDER — ARIPIPRAZOLE 15 MG PO TABS
15.0000 mg | ORAL_TABLET | Freq: Every day | ORAL | 3 refills | Status: DC
Start: 2023-10-13 — End: 2024-01-07

## 2023-10-13 NOTE — Progress Notes (Signed)
 [facility] MD/PA/NP OP Progress Note Virtual Visit via Video Note  I connected with Tanya Moore on 10/13/23 at  8:30 AM EST by a video enabled telemedicine application and verified that I am speaking with the correct person using two identifiers.  Location: Patient: Work Provider: Clinic   I discussed the limitations of evaluation and management by telemedicine and the availability of in person appointments. The patient expressed understanding and agreed to proceed.  I provided 30 minutes of non-face-to-face time during this encounter.       10/13/2023 8:38 AM Tanya Moore  MRN:  [id number]  Chief Complaint: "Not much have changed".   HPI: 43 year old female seen today for follow up psychiatric evaluation. She has a psychiatric history of ADHD, Bipolar 2, anxiety, and depression. She is currently managed on Adderall XR 15 mg twice daily, Zoloft 150 mg daily, and Abilify 15 mg daily.She reports her medications are  effective in managing her psychiatric conditions.  Today she was well groomed, pleasant, cooperative and engaged in conversation.  She informed Clinical research associate that not much have changed. She notes that her mood continues to be stable and notes she has minimal anxiety and depression. Today provider conducted a GAD-7 and patient scored a 6, at her last visit she scored a 6. Provider also conducted PHQ-9 patient scored a 2, at her last visit she scored a 2.  Today she denies SI/HI/AVH, mania,  or paranoia.   No medication changes made today. Patient agreeable to continue medications as prescribed.   Adderall not filled as it was filled on 10/01/2023. No other concerns at this time. Visit Diagnosis:    ICD-10-CM   1. Bipolar 2 disorder, major depressive episode (HCC)  F31.81 ARIPiprazole (ABILIFY) 15 MG tablet    sertraline (ZOLOFT) 100 MG tablet         Past Psychiatric History: History of anxiety, was diagnosed with bipolar disorder back in 2016.  Has been tried on several her  medications.  Was being seen by PCP [facility] by [facility].  Stopped taking medications after she found out she is pregnant in 2020.  Past Medical History:  Past Medical History:  Diagnosis Date   ADHD    Anxiety    Bipolar 2 disorder (HCC)    Depression    Ectopic pregnancy with intrauterine pregnancy/Heterotopic Pregnancy 05/03/2019   S/p laparoscopic right salpingectomy on 05/03/19 05/03/19 SURGICAL PATHOLOGY FALLOPIAN TUBE WITH ECTOPIC PREGNANCY, RIGHT, SALPINGECTOMY:  - Benign fallopian tube with chorionic villi consistent with ectopic pregnancy.    Hemoperitoneum due to rupture of right tubal ectopic pregnancy 05/03/2019   History of ectopic pregnancy in 11/2018 11/20/2018   Mtx#1 given 4/3: Hcg day 0 (147), day 4 (180), day 7 (193) Mtx # 2 given 4/9 (193)->4/12 (153)->4/15 (79)   Hyperlipidemia    Infection    UTI   Pregnancy induced hypertension    Psoriasis 2020   Seasonal allergies     Past Surgical History:  Procedure Laterality Date   APPENDECTOMY     BUNIONECTOMY     CESAREAN SECTION  2017   CESAREAN SECTION N/A 11/27/2019   Procedure: CESAREAN SECTION AND IUD INSERTION;  Surgeon: Hermina Staggers, MD;  Location: [facility];  Service: Obstetrics;  Laterality: N/A;   DIAGNOSTIC LAPAROSCOPY WITH REMOVAL OF ECTOPIC PREGNANCY N/A 05/03/2019   Procedure: RIGHT SALPINGECTOMY AND REMOVAL OF ECTOPIC PREGNANCY;  Surgeon: Tereso Newcomer, MD;  Location: [facility] OR;  Service: Gynecology;  Laterality: N/A;   LAPAROSCOPIC  BILATERAL SALPINGECTOMY Left 02/17/2022   Procedure: LAPAROSCOPIC LEFT SALPINGECTOMY;  Surgeon: Warden Fillers, MD;  Location: [facility];  Service: Gynecology;  Laterality: Left;   TONSILLECTOMY     TUBAL LIGATION      Family Psychiatric History: Sister depression   Family History:  Family History  Problem Relation Age of Onset   Hyperlipidemia Mother    Hypertension Mother    Hyperlipidemia Father    Hypertension Father    Cancer Father        prostate    Depression Sister     Social History:  Social History   Socioeconomic History   Marital status: Single    Spouse name: Not on file   Number of children: 2   Years of education: Not on file   Highest education level: Not on file  Occupational History   Not on file  Tobacco Use   Smoking status: Every Day    Current packs/day: 0.00    Types: Cigarettes    Last attempt to quit: 2017    Years since quitting: 8.1   Smokeless tobacco: Never  Vaping Use   Vaping status: Some Days  Substance and Sexual Activity   Alcohol use: No   Drug use: No   Sexual activity: Yes    Birth control/protection: Surgical  Other Topics Concern   Not on file  Social History Narrative   Not on file   Social Drivers of Health   Financial Resource Strain: Not on file  Food Insecurity: Food Insecurity Present (02/12/2022)   Hunger Vital Sign    Worried About Running Out of Food in the Last Year: Sometimes true    Ran Out of Food in the Last Year: Never true  Transportation Needs: No Transportation Needs (02/12/2022)   PRAPARE - Administrator, Civil Service (Medical): No    Lack of Transportation (Non-Medical): No  Physical Activity: Not on file  Stress: Not on file  Social Connections: Not on file    Allergies:  Allergies  Allergen Reactions   Lamictal [Lamotrigine] Rash    Patient reported    Metabolic Disorder Labs: Lab Results  Component Value Date   HGBA1C 5.4 09/02/2022   No results found for: "PROLACTIN" Lab Results  Component Value Date   CHOL 199 08/19/2023   TRIG 82 08/19/2023   HDL 55 08/19/2023   CHOLHDL 3.6 08/19/2023   LDLCALC 129 (H) 08/19/2023   LDLCALC 98 11/30/2022   Lab Results  Component Value Date   TSH 0.568 08/19/2023   TSH 0.428 (L) 09/02/2022    Therapeutic Level Labs: No results found for: "LITHIUM" No results found for: "VALPROATE" No results found for: "CBMZ"  Current Medications: Current Outpatient Medications  Medication Sig  Dispense Refill   amphetamine-dextroamphetamine (ADDERALL XR) 15 MG 24 hr capsule Patient to take 1 tablet in the morning and evening 60 capsule 0   ARIPiprazole (ABILIFY) 15 MG tablet Take 1 tablet (15 mg total) by mouth daily. 30 tablet 3   atorvastatin (LIPITOR) 20 MG tablet Take 1 tablet (20 mg total) by mouth daily. 90 tablet 3   ibuprofen (ADVIL) 600 MG tablet Take 1 tablet (600 mg total) by mouth every 6 (six) hours as needed for headache, mild pain, moderate pain or cramping. 30 tablet 2   sertraline (ZOLOFT) 100 MG tablet Take 1.5 tablets (150 mg total) by mouth daily with breakfast. 45 tablet 3   varenicline (CHANTIX CONTINUING MONTH PAK) 1 MG tablet Take  1 tablet (1 mg total) by mouth 2 (two) times daily. 180 tablet 0   No current facility-administered medications for this visit.     Musculoskeletal: Strength & Muscle Tone: within normal limits and telehealth visit Gait & Station: normal, telehealth visit Patient leans: N/A  Psychiatric Specialty Exam: Review of Systems  There were no vitals taken for this visit.There is no height or weight on file to calculate BMI.  General Appearance: Well Groomed  Eye Contact:  Good  Speech:  Clear and Coherent and Normal Rate  Volume:  Normal  Mood:  Euthymic  Affect:  Appropriate, Congruent, and Tearful  Thought Process:  Coherent, Goal Directed, and Linear  Orientation:  Full (Time, Place, and Person)  Thought Content: WDL and Logical   Suicidal Thoughts:  No  Homicidal Thoughts:  No  Memory:  Immediate;   Good Recent;   Good Remote;   Good  Judgement:  Good  Insight:  Good  Psychomotor Activity:  Normal  Concentration:  Concentration: Good and Attention Span: Good  Recall:  Good  Fund of Knowledge: Good  Language: Good  Akathisia:  No  Handed:  Right  AIMS (if indicated): not done  Assets:  Communication Skills Desire for Improvement Financial Resources/Insurance Housing Intimacy Leisure Time Physical Health Social  Support  ADL's:  Intact  Cognition: WNL  Sleep:  Good   Screenings: GAD-7    Flowsheet Row Video Visit from 10/13/2023 in [facility] Video Visit from 08/09/2023 in [facility] Video Visit from 04/14/2023 in [facility] Video Visit from 01/20/2023 in [facility] Video Visit from 11/11/2022 in [facility]  Total GAD-7 Score 6 6 7 13 11       PHQ2-9    Flowsheet Row Video Visit from 10/13/2023 in [facility] Video Visit from 08/09/2023 in [facility] Video Visit from 04/14/2023 in [facility] Video Visit from 01/20/2023 in [facility] Office Visit from 11/30/2022 in [facility] [place]  PHQ-2 Total Score 0 0 2 6 2   PHQ-9 Total Score 2 2 7 20 8       Flowsheet Row Admission (Discharged) from 02/17/2022 in [facility]IOPER[facility]IVE [facility] Pre-Admission Testing 60 from 02/09/2022 in [facility] PREADMISSION TESTING Admission (Discharged) from 10/21/2021 in [facility] 1S Maternity Assessment Unit  C-SSRS RISK CATEGORY No Risk No Risk No Risk        Assessment and Plan: Patient notes that she is doing well on her current medication regimen. No medication changes made today. Patient agreeable to continue medications as prescribed.   Adderall not filled as it was filled on 10/01/2023.  1. Attention deficit hyperactivity disorder (ADHD), predominantly inattentive type  2. Bipolar 2 disorder, major depressive episode (HCC)  Continue- ARIPiprazole (ABILIFY) 15 MG tablet; Take 1 tablet (15 mg total) by mouth daily.  Dispense: 30 tablet; Refill: 3 Continue- sertraline (ZOLOFT) 100 MG tablet; Take 1.5 tablets (150 mg total) by mouth daily with breakfast.  Dispense: 45 tablet; Refill: 3     Follow  up in 2 months   Shanna Cisco, NP 10/13/2023, 8:38 AM

## 2023-10-29 ENCOUNTER — Other Ambulatory Visit (HOSPITAL_COMMUNITY): Payer: Self-pay | Admitting: Psychiatry

## 2023-10-29 ENCOUNTER — Telehealth (HOSPITAL_COMMUNITY): Payer: Self-pay

## 2023-10-29 DIAGNOSIS — F900 Attention-deficit hyperactivity disorder, predominantly inattentive type: Secondary | ICD-10-CM

## 2023-10-29 MED ORDER — AMPHETAMINE-DEXTROAMPHET ER 15 MG PO CP24: ORAL_CAPSULE | ORAL | 0 refills | Status: DC

## 2023-10-29 NOTE — Telephone Encounter (Signed)
 Medication sent to preferred pharmacy

## 2023-10-29 NOTE — Telephone Encounter (Signed)
 Hello,      Pt called today asking for a refill of Adderall,  last seen March, 5, 2025 last filled 2/21

## 2023-11-20 ENCOUNTER — Other Ambulatory Visit: Payer: Self-pay

## 2023-11-20 ENCOUNTER — Ambulatory Visit
Admission: RE | Admit: 2023-11-20 | Discharge: 2023-11-20 | Disposition: A | Discharge status: Home / Self Care | Source: Ambulatory Visit | Attending: Family Medicine | Admitting: Family Medicine

## 2023-11-20 VITALS — BP 127/88 | HR 87 | Temp 98.5°F | Resp 18 | Ht 64.0 in | Wt 130.0 lb

## 2023-11-20 DIAGNOSIS — H00039 Abscess of eyelid unspecified eye, unspecified eyelid: Secondary | ICD-10-CM

## 2023-11-20 MED ORDER — CEPHALEXIN 500 MG PO CAPS: 500.0000 mg | ORAL_CAPSULE | Freq: Two times a day (BID) | ORAL | 0 refills | Status: AC

## 2023-11-20 NOTE — ED Provider Notes (Signed)
 [name]    CSN: [id number] Arrival date & time: 11/20/23  1100      History   Chief Complaint Chief Complaint  Patient presents with   Eye Problem    Lower eyelid is swollen and sore. It has been this way a few days - Entered by patient    HPI Tanya Moore is a 43 y.o. female.    Eye Problem Here for swelling of her left lower eyelid.  It began bothering her on April 10.  No eye redness or eye drainage.  She does also note some left-sided sore throat.  No ear pain or fever.  She has had some congestion and some cough for a week or 2, felt mostly to be due to allergies.  She is allergic to Lamictal but no other medication  Last menstrual cycle was March 13.  They have been slightly irregular due to perimenopause.   Past Medical History:  Diagnosis Date   ADHD    Anxiety    Bipolar 2 disorder (HCC)    Depression    Ectopic pregnancy with intrauterine pregnancy/Heterotopic Pregnancy 05/03/2019   S/p laparoscopic right salpingectomy on 05/03/19 05/03/19 SURGICAL PATHOLOGY FALLOPIAN TUBE WITH ECTOPIC PREGNANCY, RIGHT, SALPINGECTOMY:  - Benign fallopian tube with chorionic villi consistent with ectopic pregnancy.    Hemoperitoneum due to rupture of right tubal ectopic pregnancy 05/03/2019   History of ectopic pregnancy in 11/2018 11/20/2018   Mtx#1 given 4/3: Hcg day 0 (147), day 4 (180), day 7 (193) Mtx # 2 given 4/9 (193)->4/12 (153)->4/15 (79)   Hyperlipidemia    Infection    UTI   Pregnancy induced hypertension    Psoriasis 2020   Seasonal allergies     Patient Active Problem List   Diagnosis Date Noted   Dermatofibroma of left upper arm 11/30/2022   Tobacco use disorder 11/30/2022   Attention deficit hyperactivity disorder (ADHD), predominantly inattentive type 10/02/2020   Bipolar disorder (HCC) 06/05/2020   Hypercholesteremia 02/19/2019    Past Surgical History:  Procedure Laterality Date   APPENDECTOMY     BUNIONECTOMY     CESAREAN  SECTION  2017   CESAREAN SECTION N/A 11/27/2019   Procedure: CESAREAN SECTION AND IUD INSERTION;  Surgeon: Othelia Blinks, MD;  Location: [facility];  Service: Obstetrics;  Laterality: N/A;   DIAGNOSTIC LAPAROSCOPY WITH REMOVAL OF ECTOPIC PREGNANCY N/A 05/03/2019   Procedure: RIGHT SALPINGECTOMY AND REMOVAL OF ECTOPIC PREGNANCY;  Surgeon: Julianne Octave, MD;  Location: [facility] OR;  Service: Gynecology;  Laterality: N/A;   LAPAROSCOPIC BILATERAL SALPINGECTOMY Left 02/17/2022   Procedure: LAPAROSCOPIC LEFT SALPINGECTOMY;  Surgeon: Abigail Abler, MD;  Location: [facility] OR;  Service: Gynecology;  Laterality: Left;   TONSILLECTOMY     TUBAL LIGATION      OB History     Gravida  6   Para  2   Term  2   Preterm      AB  3   Living  2      SAB      IAB  1   Ectopic  2   Multiple  0   Live Births  2            Home Medications    Prior to Admission medications   Medication Sig Start Date End Date Taking? Authorizing Provider  cephALEXin (KEFLEX) 500 MG capsule Take 1 capsule (500 mg total) by mouth 2 (two) times daily for 7 days. 11/20/23 11/27/23 Yes  Ann Keto, MD  amphetamine-dextroamphetamine (ADDERALL XR) 15 MG 24 hr capsule Patient to take 1 tablet in the morning and evening 10/29/23   Parsons, Brittney E, NP  ARIPiprazole (ABILIFY) 15 MG tablet Take 1 tablet (15 mg total) by mouth daily. 10/13/23   Arlyne Bering, NP  sertraline (ZOLOFT) 100 MG tablet Take 1.5 tablets (150 mg total) by mouth daily with breakfast. 10/13/23   Arlyne Bering, NP    Family History Family History  Problem Relation Age of Onset   Hyperlipidemia Mother    Hypertension Mother    Hyperlipidemia Father    Hypertension Father    Cancer Father        prostate   Depression Sister     Social History Social History   Tobacco Use   Smoking status: Every Day    Current packs/day: 0.00    Types: Cigarettes    Last attempt to quit: 2017    Years since quitting: 8.2    Smokeless tobacco: Never  Vaping Use   Vaping status: Some Days  Substance Use Topics   Alcohol use: No   Drug use: No     Allergies   Lamictal [lamotrigine]   Review of Systems Review of Systems   Physical Exam Triage Vital Signs ED Triage Vitals  Encounter Vitals Group     BP 11/20/23 1134 127/88     Systolic BP Percentile --      Diastolic BP Percentile --      Pulse Rate 11/20/23 1134 87     Resp 11/20/23 1134 18     Temp 11/20/23 1134 98.5 F (36.9 C)     Temp Source 11/20/23 1134 Oral     SpO2 11/20/23 1134 98 %     Weight 11/20/23 1136 130 lb (59 kg)     Height 11/20/23 1136 5\' 4"  (1.626 m)     Head Circumference --      Peak Flow --      Pain Score 11/20/23 1135 6     Pain Loc --      Pain Education --      Exclude from Growth Chart --    No data found.  Updated Vital Signs BP 127/88 (BP Location: Right Arm)   Pulse 87   Temp 98.5 F (36.9 C) (Oral)   Resp 18   Ht 5\' 4"  (1.626 m)   Wt 59 kg   LMP 10/21/2023 (Exact Date)   SpO2 98%   BMI 22.31 kg/m   Visual Acuity Right Eye Distance:   Left Eye Distance:   Bilateral Distance:    Right Eye Near:   Left Eye Near:    Bilateral Near:     Physical Exam Vitals reviewed.  Constitutional:      General: She is not in acute distress.    Appearance: She is not toxic-appearing.  HENT:     Right Ear: Tympanic membrane and ear canal normal.     Left Ear: Tympanic membrane and ear canal normal.     Mouth/Throat:     Mouth: Mucous membranes are moist.     Pharynx: No oropharyngeal exudate or posterior oropharyngeal erythema.     Comments: No tonsillar hypertrophy or erythema of the oropharynx. Eyes:     Extraocular Movements: Extraocular movements intact.     Conjunctiva/sclera: Conjunctivae normal.     Pupils: Pupils are equal, round, and reactive to light.     Comments: The left lower eyelid is puffy  and mildly tender.  It is firm to palpation about 2 cm inferior to the rim of the left lower  eyelid.  Cardiovascular:     Rate and Rhythm: Normal rate and regular rhythm.     Heart sounds: No murmur heard. Pulmonary:     Effort: Pulmonary effort is normal. No respiratory distress.     Breath sounds: No wheezing, rhonchi or rales.  Chest:     Chest wall: No tenderness.  Musculoskeletal:     Cervical back: Neck supple.  Lymphadenopathy:     Cervical: No cervical adenopathy.  Skin:    Capillary Refill: Capillary refill takes less than 2 seconds.     Coloration: Skin is not jaundiced or pale.  Neurological:     General: No focal deficit present.     Mental Status: She is alert and oriented to person, place, and time.  Psychiatric:        Behavior: Behavior normal.      UC Treatments / Results  Labs (all labs ordered are listed, but only abnormal results are displayed) Labs Reviewed - No data to display  EKG   Radiology No results found.  Procedures Procedures (including critical care time)  Medications Ordered in UC Medications - No data to display  Initial Impression / Assessment and Plan / UC Course  I have reviewed the triage vital signs and the nursing notes.  Pertinent labs & imaging results that were available during my care of the patient were reviewed by me and considered in my medical decision making (see chart for details).     Keflex is sent in to treat possible cellulitis of the eyelid. Final Clinical Impressions(s) / UC Diagnoses   Final diagnoses:  Cellulitis of eyelid     Discharge Instructions      Take cephalexin 500 mg--1 capsule 2 times daily for 7 days      ED Prescriptions     Medication Sig Dispense Auth. Provider   cephALEXin (KEFLEX) 500 MG capsule Take 1 capsule (500 mg total) by mouth 2 (two) times daily for 7 days. 14 capsule Ellsworth Haas, Hoda Hon K, MD      PDMP not reviewed this encounter.   Ann Keto, MD 11/20/23 1158

## 2023-11-20 NOTE — ED Triage Notes (Addendum)
 Pt presents with complaints of left eye swelling and soreness x 3 days. Pt currently rates her overall eye pain a 6/10. Zyrtec taken daily. Denies vision changes to eye. Pt also mentions soreness on the left side of throat.

## 2023-11-20 NOTE — Discharge Instructions (Signed)
 Take cephalexin 500 mg--1 capsule 2 times daily for 7 days

## 2023-11-29 ENCOUNTER — Telehealth (HOSPITAL_COMMUNITY): Payer: Self-pay

## 2023-11-29 NOTE — Telephone Encounter (Signed)
 Hello,      Pt called today asking for a refill of Adderall,  last seen March, 21, 2025 last filled 3/21

## 2023-11-30 ENCOUNTER — Other Ambulatory Visit (HOSPITAL_COMMUNITY): Payer: Self-pay | Admitting: Psychiatry

## 2023-11-30 DIAGNOSIS — F900 Attention-deficit hyperactivity disorder, predominantly inattentive type: Secondary | ICD-10-CM

## 2023-11-30 MED ORDER — AMPHETAMINE-DEXTROAMPHET ER 15 MG PO CP24: ORAL_CAPSULE | ORAL | 0 refills | Status: DC

## 2023-11-30 NOTE — Telephone Encounter (Signed)
Medication refilled and sent to preferred pharmacy

## 2023-12-17 ENCOUNTER — Telehealth (HOSPITAL_COMMUNITY): Admitting: Psychiatry

## 2024-01-07 ENCOUNTER — Encounter (HOSPITAL_COMMUNITY): Payer: Self-pay | Admitting: Psychiatry

## 2024-01-07 ENCOUNTER — Other Ambulatory Visit: Payer: Self-pay

## 2024-01-07 ENCOUNTER — Ambulatory Visit
Admission: RE | Admit: 2024-01-07 | Discharge: 2024-01-07 | Disposition: A | Discharge status: Home / Self Care | Source: Ambulatory Visit | Attending: Physician Assistant | Admitting: Physician Assistant

## 2024-01-07 ENCOUNTER — Telehealth (HOSPITAL_COMMUNITY): Admitting: Psychiatry

## 2024-01-07 VITALS — BP 139/97 | HR 82 | Temp 98.3°F | Resp 16 | Ht 64.0 in | Wt 125.0 lb

## 2024-01-07 DIAGNOSIS — F900 Attention-deficit hyperactivity disorder, predominantly inattentive type: Secondary | ICD-10-CM

## 2024-01-07 DIAGNOSIS — J029 Acute pharyngitis, unspecified: Secondary | ICD-10-CM | POA: Insufficient documentation

## 2024-01-07 DIAGNOSIS — H6593 Unspecified nonsuppurative otitis media, bilateral: Secondary | ICD-10-CM | POA: Insufficient documentation

## 2024-01-07 DIAGNOSIS — F3181 Bipolar II disorder: Secondary | ICD-10-CM

## 2024-01-07 LAB — POCT RAPID STREP A (OFFICE): Rapid Strep A Screen: NEGATIVE

## 2024-01-07 MED ORDER — AMPHETAMINE-DEXTROAMPHET ER 15 MG PO CP24: ORAL_CAPSULE | ORAL | 0 refills | Status: DC

## 2024-01-07 MED ORDER — SERTRALINE HCL 100 MG PO TABS: 150.0000 mg | ORAL_TABLET | Freq: Every day | ORAL | 3 refills | Status: DC

## 2024-01-07 MED ORDER — ARIPIPRAZOLE 15 MG PO TABS: 15.0000 mg | ORAL_TABLET | Freq: Every day | ORAL | 3 refills | Status: DC

## 2024-01-07 NOTE — Progress Notes (Signed)
 [facility] MD/PA/NP OP Progress Note Virtual Visit via Video Note  I connected with Tanya Moore on 01/07/24 at 11:30 AM EDT by a video enabled telemedicine application and verified that I am speaking with the correct person using two identifiers.  Location: Patient: Work Provider: Clinic   I discussed the limitations of evaluation and management by telemedicine and the availability of in person appointments. The patient expressed understanding and agreed to proceed.  I provided 30 minutes of non-face-to-face time during this encounter.       01/07/2024 11:42 AM Tanya Moore  MRN:  [id number]  Chief Complaint: "I have a new job.   HPI: 64 yea old seen today for follow up psychiatric evaluation. She has a psychiatric history of ADHD, Bipolar 2, anxiety, and depression. She is currently managed on Adderall XR 15 mg twice daily, Zoloft  150 mg daily, and Abilify  15 mg daily.She reports her medications are  effective in managing her psychiatric conditions.  Today she was well groomed, pleasant, cooperative and engaged in conversation.  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  She notes that she finds enjoyment in this job and does not miss her old position at [employer].  Patient notes that her mood, anxiety, depression continues to be well-managed.  Today provider conducted a GAD-7 and patient scored a 8, at her last visit she scored a 6. Provider also conducted PHQ-9 patient scored a 4, at her last visit she scored a 2.  She endorses adequate sleep and appetite.  Today she denies SI/HI/AVH, mania,  or paranoia.   No medication changes made today. Patient agreeable to continue medications as prescribed.   Patient informed that she would need to do a UDS prior to next refill appointment.  She endorsed understanding and agreed.  Provider also ordered a CBC, CMP, lipid panel, HgbA1c, thyroid  panel, and LFT.  She will no other concerns at this time. Visit  Diagnosis:    ICD-10-CM   1. Attention deficit hyperactivity disorder (ADHD), predominantly inattentive type  F90.0 amphetamine -dextroamphetamine (ADDERALL XR) 15 MG 24 hr capsule    DRUG MONITOR, MARIJUANAMETAB, QN, URINE    DRUG MONITOR, MARIJUANAMETAB, QN, URINE    Urine Drug Panel 7    2. Bipolar 2 disorder, major depressive episode (HCC)  F31.81 ARIPiprazole  (ABILIFY ) 15 MG tablet    sertraline  (ZOLOFT ) 100 MG tablet    Rapid urine drug screen (hospital performed)    Hepatic function panel    Thyroid  Panel With TSH    CBC w/Diff/Platelet    HgB A1c    Comprehensive Metabolic Panel (CMET)    DRUG MONITOR, MARIJUANAMETAB, QN, URINE    DRUG MONITOR, MARIJUANAMETAB, QN, URINE    Comprehensive Metabolic Panel (CMET)    HgB A1c    CBC w/Diff/Platelet    Thyroid  Panel With TSH    Hepatic function panel    Rapid urine drug screen (hospital performed)          Past Psychiatric History: History of anxiety, was diagnosed with bipolar disorder back in 2016.  Has been tried on several her medications.  Was being seen by PCP followed by [facility].  Stopped taking medications after she found out she is pregnant in 2020.  Past Medical History:  Past Medical History:  Diagnosis Date   ADHD    Anxiety    Bipolar 2 disorder (HCC)    Depression    Ectopic pregnancy with intrauterine pregnancy/Heterotopic Pregnancy 05/03/2019  S/p laparoscopic right salpingectomy on 05/03/19 05/03/19 SURGICAL PATHOLOGY FALLOPIAN TUBE WITH ECTOPIC PREGNANCY, RIGHT, SALPINGECTOMY:  - Benign fallopian tube with chorionic villi consistent with ectopic pregnancy.    Hemoperitoneum due to rupture of right tubal ectopic pregnancy 05/03/2019   History of ectopic pregnancy in 11/2018 11/20/2018   Mtx#1 given 4/3: Hcg day 0 (147), day 4 (180), day 7 (193) Mtx # 2 given 4/9 (193)->4/12 (153)->4/15 (79)   Hyperlipidemia    Infection    UTI   Pregnancy induced hypertension    Psoriasis 2020   Seasonal allergies      Past Surgical History:  Procedure Laterality Date   APPENDECTOMY     BUNIONECTOMY     CESAREAN SECTION  2017   CESAREAN SECTION N/A 11/27/2019   Procedure: CESAREAN SECTION AND IUD INSERTION;  Surgeon: Othelia Blinks, MD;  [facility]: [facility];  Service: Obstetrics;  Laterality: N/A;   DIAGNOSTIC LAPAROSCOPY WITH REMOVAL OF ECTOPIC PREGNANCY N/A 05/03/2019   Procedure: RIGHT SALPINGECTOMY AND REMOVAL OF ECTOPIC PREGNANCY;  Surgeon: Julianne Octave, MD;  Location: [facility] OR;  Service: Gynecology;  Laterality: N/A;   LAPAROSCOPIC BILATERAL SALPINGECTOMY Left 02/17/2022   Procedure: LAPAROSCOPIC LEFT SALPINGECTOMY;  Surgeon: Abigail Abler, MD;  Location: [facility] OR;  Service: Gynecology;  Laterality: Left;   TONSILLECTOMY     TUBAL LIGATION      Family Psychiatric History: Sister depression   Family History:  Family History  Problem Relation Age of Onset   Hyperlipidemia Mother    Hypertension Mother    Hyperlipidemia Father    Hypertension Father    Cancer Father        prostate   Depression Sister     Social History:  Social History   Socioeconomic History   Marital status: Single    Spouse name: Not on file   Number of children: 2   Years of education: Not on file   Highest education level: Not on file  Occupational History   Not on file  Tobacco Use   Smoking status: Every Day    Current packs/day: 0.00    Types: Cigarettes    Last attempt to quit: 2017    Years since quitting: 8.4   Smokeless tobacco: Never  Vaping Use   Vaping status: Some Days  Substance and Sexual Activity   Alcohol use: No   Drug use: No   Sexual activity: Yes    Birth control/protection: Surgical  Other Topics Concern   Not on file  Social History Narrative   Not on file   Social Drivers of Health   Financial Resource Strain: Not on file  Food Insecurity: Food Insecurity Present (02/12/2022)   Hunger Vital Sign    Worried About Running Out of Food in the Last Year: Sometimes true     Ran Out of Food in the Last Year: Never true  Transportation Needs: No Transportation Needs (02/12/2022)   PRAPARE - Administrator, Civil Service (Medical): No    Lack of Transportation (Non-Medical): No  Physical Activity: Not on file  Stress: Not on file  Social Connections: Not on file    Allergies:  Allergies  Allergen Reactions   Lamictal [Lamotrigine] Rash    Patient reported    Metabolic Disorder Labs: Lab Results  Component Value Date   HGBA1C 5.4 09/02/2022   No results found for: "PROLACTIN" Lab Results  Component Value Date   CHOL 199 08/19/2023   TRIG 82 08/19/2023  HDL 55 08/19/2023   CHOLHDL 3.6 08/19/2023   LDLCALC 129 (H) 08/19/2023   LDLCALC 98 11/30/2022   Lab Results  Component Value Date   TSH 0.568 08/19/2023   TSH 0.428 (L) 09/02/2022    Therapeutic Level Labs: No results found for: "LITHIUM" No results found for: "VALPROATE" No results found for: "CBMZ"  Current Medications: Current Outpatient Medications  Medication Sig Dispense Refill   amphetamine -dextroamphetamine (ADDERALL XR) 15 MG 24 hr capsule Patient to take 1 tablet in the morning and evening 60 capsule 0   ARIPiprazole  (ABILIFY ) 15 MG tablet Take 1 tablet (15 mg total) by mouth daily. 30 tablet 3   sertraline  (ZOLOFT ) 100 MG tablet Take 1.5 tablets (150 mg total) by mouth daily with breakfast. 45 tablet 3   No current facility-administered medications for this visit.     Musculoskeletal: Strength & Muscle Tone: within normal limits and telehealth visit Gait & Station: normal, telehealth visit Patient leans: N/A  Psychiatric Specialty Exam: Review of Systems  Last menstrual period 01/01/2024.There is no height or weight on file to calculate BMI.  General Appearance: Well Groomed  Eye Contact:  Good  Speech:  Clear and Coherent and Normal Rate  Volume:  Normal  Mood:  Euthymic  Affect:  Appropriate, Congruent, and Tearful  Thought Process:  Coherent, Goal  Directed, and Linear  Orientation:  Full (Time, Place, and Person)  Thought Content: WDL and Logical   Suicidal Thoughts:  No  Homicidal Thoughts:  No  Memory:  Immediate;   Good Recent;   Good Remote;   Good  Judgement:  Good  Insight:  Good  Psychomotor Activity:  Normal  Concentration:  Concentration: Good and Attention Span: Good  Recall:  Good  Fund of Knowledge: Good  Language: Good  Akathisia:  No  Handed:  Right  AIMS (if indicated): not done  Assets:  Communication Skills Desire for Improvement Financial Resources/Insurance Housing Intimacy Leisure Time Physical Health Social Support  ADL's:  Intact  Cognition: WNL  Sleep:  Good   Screenings: GAD-7    Flowsheet Row Video Visit from 01/07/2024 in [facility] Video Visit from 10/13/2023 in [facility] Video Visit from 08/09/2023 in [facility] Video Visit from 04/14/2023 in [facility] Video Visit from 01/20/2023 in [facility]  Total GAD-7 Score 8 6 6 7 13       PHQ2-9    Flowsheet Row Video Visit from 01/07/2024 in [facility] Video Visit from 10/13/2023 in [facility] Video Visit from 08/09/2023 in [facility] Video Visit from 04/14/2023 in [facility] Video Visit from 01/20/2023 in [facility]  PHQ-2 Total Score 1 0 0 2 6  PHQ-9 Total Score 4 2 2 7 20       Flowsheet Row Video Visit from 01/07/2024 in [facility] Most recent reading at 01/07/2024 11:24 AM UC from 01/07/2024 in [facility]) Most recent reading at 01/07/2024  9:14 AM UC from 11/20/2023 in [facility] at [facility]) Most recent reading at 11/20/2023 11:38 AM  C-SSRS RISK  CATEGORY No Risk No Risk No Risk        Assessment and Plan: Patient notes that she is doing well on her current medication regimen. No medication changes made today. Patient agreeable to  continue medications as prescribed.   Patient informed that she would need to do a UDS prior to next refill appointment.  She endorsed understanding and agreed.  Provider also ordered a CBC, CMP, lipid panel, HgbA1c, thyroid  panel, and LFT.    1. Attention deficit hyperactivity disorder (ADHD), predominantly inattentive type  Continue- amphetamine -dextroamphetamine (ADDERALL XR) 15 MG 24 hr capsule; Patient to take 1 tablet in the morning and evening  Dispense: 60 capsule; Refill: 0 - DRUG MONITOR, MARIJUANAMETAB, QN, URINE; Future  2. Bipolar 2 disorder, major depressive episode (HCC)  Continue - ARIPiprazole  (ABILIFY ) 15 MG tablet; Take 1 tablet (15 mg total) by mouth daily.  Dispense: 30 tablet; Refill: 3 - sertraline  (ZOLOFT ) 100 MG tablet; Take 1.5 tablets (150 mg total) by mouth daily with breakfast.  Dispense: 45 tablet; Refill: 3 - Rapid urine drug screen (hospital performed); Future - Hepatic function panel; Future - Thyroid  Panel With TSH; Future - CBC w/Diff/Platelet; Future - HgB A1c; Future - Comprehensive Metabolic Panel (CMET); Future - DRUG MONITOR, MARIJUANAMETAB, QN, URINE; Future   Follow up in 3 months   Arlyne Bering, NP 01/07/2024, 11:42 AM

## 2024-01-07 NOTE — ED Triage Notes (Signed)
 Pt presents with complaints of sore throat and fluid in ears. Symptoms began last night, 5/29 at 6 PM. Denies OTC medications PTA for symptoms reported. However pt did drink hot tea with honey with improvement. Denies fevers at home. Pt currently rates her overall pain a 6/10.

## 2024-01-07 NOTE — ED Provider Notes (Signed)
 [name]    CSN: [id number] Arrival date & time: 01/07/24  0855      History   Chief Complaint Chief Complaint  Patient presents with   Sore Throat    Feels like there's fluid in my ears as well - Entered by patient    HPI Tanya Moore is a 43 y.o. female.   HPI Pt presents today with concerns of sore throat and fluid in the ears which started yesterday afternoon She denies fever, chills, nasal congestion, rhinorrhea, postnasal drainage, nausea, vomiting, diarrhea Interventions: warm tea with honey, Ricola,   She denies recent sick contacts but she does have two young children  She denies recent travel   Past Medical History:  Diagnosis Date   ADHD    Anxiety    Bipolar 2 disorder (HCC)    Depression    Ectopic pregnancy with intrauterine pregnancy/Heterotopic Pregnancy 05/03/2019   S/p laparoscopic right salpingectomy on 05/03/19 05/03/19 SURGICAL PATHOLOGY FALLOPIAN TUBE WITH ECTOPIC PREGNANCY, RIGHT, SALPINGECTOMY:  - Benign fallopian tube with chorionic villi consistent with ectopic pregnancy.    Hemoperitoneum due to rupture of right tubal ectopic pregnancy 05/03/2019   History of ectopic pregnancy in 11/2018 11/20/2018   Mtx#1 given 4/3: Hcg day 0 (147), day 4 (180), day 7 (193) Mtx # 2 given 4/9 (193)->4/12 (153)->4/15 (79)   Hyperlipidemia    Infection    UTI   Pregnancy induced hypertension    Psoriasis 2020   Seasonal allergies     Patient Active Problem List   Diagnosis Date Noted   Dermatofibroma of left upper arm 11/30/2022   Tobacco use disorder 11/30/2022   Attention deficit hyperactivity disorder (ADHD), predominantly inattentive type 10/02/2020   Bipolar disorder (HCC) 06/05/2020   Hypercholesteremia 02/19/2019    Past Surgical History:  Procedure Laterality Date   APPENDECTOMY     BUNIONECTOMY     CESAREAN SECTION  2017   CESAREAN SECTION N/A 11/27/2019   Procedure: CESAREAN SECTION AND IUD INSERTION;  Surgeon: Othelia Blinks, MD;  Location: [facility];  Service: Obstetrics;  Laterality: N/A;   DIAGNOSTIC LAPAROSCOPY WITH REMOVAL OF ECTOPIC PREGNANCY N/A 05/03/2019   Procedure: RIGHT SALPINGECTOMY AND REMOVAL OF ECTOPIC PREGNANCY;  Surgeon: Julianne Octave, MD;  Location: [facility] OR;  Service: Gynecology;  Laterality: N/A;   LAPAROSCOPIC BILATERAL SALPINGECTOMY Left 02/17/2022   Procedure: LAPAROSCOPIC LEFT SALPINGECTOMY;  Surgeon: Abigail Abler, MD;  Location: [facility] OR;  Service: Gynecology;  Laterality: Left;   TONSILLECTOMY     TUBAL LIGATION      OB History     Gravida  6   Para  2   Term  2   Preterm      AB  3   Living  2      SAB      IAB  1   Ectopic  2   Multiple  0   Live Births  2            Home Medications    Prior to Admission medications   Medication Sig Start Date End Date Taking? Authorizing Provider  amphetamine -dextroamphetamine (ADDERALL XR) 15 MG 24 hr capsule Patient to take 1 tablet in the morning and evening 01/07/24   Parsons, Brittney E, NP  ARIPiprazole  (ABILIFY ) 15 MG tablet Take 1 tablet (15 mg total) by mouth daily. 01/07/24   Arlyne Bering, NP  sertraline  (ZOLOFT ) 100 MG tablet Take 1.5 tablets (150 mg total) by mouth  daily with breakfast. 01/07/24   Arlyne Bering, NP    Family History Family History  Problem Relation Age of Onset   Hyperlipidemia Mother    Hypertension Mother    Hyperlipidemia Father    Hypertension Father    Cancer Father        prostate   Depression Sister     Social History Social History   Tobacco Use   Smoking status: Every Day    Current packs/day: 0.00    Types: Cigarettes    Last attempt to quit: 2017    Years since quitting: 8.4   Smokeless tobacco: Never  Vaping Use   Vaping status: Some Days  Substance Use Topics   Alcohol use: No   Drug use: No     Allergies   Lamictal [lamotrigine]   Review of Systems Review of Systems  Constitutional:  Positive for fatigue. Negative for chills  and fever.  HENT:  Positive for sore throat. Negative for congestion, ear pain, postnasal drip, rhinorrhea, sinus pressure, sinus pain and voice change.        Ear fullness, sensation of fluid in ears   Respiratory:  Negative for cough, shortness of breath and wheezing.   Gastrointestinal:  Negative for diarrhea, nausea and vomiting.  Musculoskeletal:  Negative for myalgias.  Neurological:  Negative for dizziness, light-headedness and headaches.     Physical Exam Triage Vital Signs ED Triage Vitals  Encounter Vitals Group     BP 01/07/24 0905 (!) 139/97     Systolic BP Percentile --      Diastolic BP Percentile --      Pulse Rate 01/07/24 0905 82     Resp 01/07/24 0905 16     Temp 01/07/24 0905 98.3 F (36.8 C)     Temp Source 01/07/24 0905 Oral     SpO2 01/07/24 0905 98 %     Weight 01/07/24 0905 125 lb (56.7 kg)     Height 01/07/24 0905 5\' 4"  (1.626 m)     Head Circumference --      Peak Flow --      Pain Score 01/07/24 0913 6     Pain Loc --      Pain Education --      Exclude from Growth Chart --    No data found.  Updated Vital Signs BP (!) 139/97 (BP Location: Right Arm)   Pulse 82   Temp 98.3 F (36.8 C) (Oral)   Resp 16   Ht 5\' 4"  (1.626 m)   Wt 125 lb (56.7 kg)   LMP 01/01/2024 (Exact Date)   SpO2 98%   BMI 21.46 kg/m   Visual Acuity Right Eye Distance:   Left Eye Distance:   Bilateral Distance:    Right Eye Near:   Left Eye Near:    Bilateral Near:     Physical Exam Vitals reviewed.  Constitutional:      General: She is awake.     Appearance: Normal appearance. She is well-developed and well-groomed.  HENT:     Head: Normocephalic and atraumatic.     Right Ear: Hearing and ear canal normal. A middle ear effusion is present. Tympanic membrane is bulging. Tympanic membrane is not injected, scarred, perforated, erythematous or retracted.     Left Ear: Hearing and ear canal normal. A middle ear effusion is present. Tympanic membrane is bulging.  Tympanic membrane is not injected, scarred, perforated, erythematous or retracted.     Mouth/Throat:  Lips: Pink.     Mouth: Mucous membranes are moist.     Pharynx: Oropharynx is clear. Uvula midline. Posterior oropharyngeal erythema present. No pharyngeal swelling, oropharyngeal exudate, uvula swelling or postnasal drip.     Tonsils: No tonsillar exudate.  Cardiovascular:     Rate and Rhythm: Normal rate and regular rhythm.     Pulses: Normal pulses.          Radial pulses are 2+ on the right side and 2+ on the left side.     Heart sounds: Normal heart sounds. No murmur heard.    No friction rub. No gallop.  Pulmonary:     Effort: Pulmonary effort is normal.     Breath sounds: Normal breath sounds. No decreased air movement. No decreased breath sounds, wheezing, rhonchi or rales.  Musculoskeletal:     Cervical back: Normal range of motion and neck supple.  Lymphadenopathy:     Head:     Right side of head: No submental, submandibular or preauricular adenopathy.     Left side of head: No submental, submandibular or preauricular adenopathy.     Cervical:     Right cervical: No superficial cervical adenopathy.    Left cervical: No superficial cervical adenopathy.     Upper Body:     Right upper body: No supraclavicular adenopathy.     Left upper body: No supraclavicular adenopathy.  Neurological:     General: No focal deficit present.     Mental Status: She is alert and oriented to person, place, and time.  Psychiatric:        Mood and Affect: Mood normal.        Behavior: Behavior normal. Behavior is cooperative.      UC Treatments / Results  Labs (all labs ordered are listed, but only abnormal results are displayed) Labs Reviewed  CULTURE, GROUP A STREP [facility])  POCT RAPID STREP A (OFFICE)    EKG   Radiology No results found.  Procedures Procedures (including critical care time)  Medications Ordered in UC Medications - No data to display  Initial Impression  / Assessment and Plan / UC Course  I have reviewed the triage vital signs and the nursing notes.  Pertinent labs & imaging results that were available during my care of the patient were reviewed by me and considered in my medical decision making (see chart for details).      Final Clinical Impressions(s) / UC Diagnoses   Final diagnoses:  Sore throat  Fluid level behind tympanic membrane of both ears   Patient presents today with concerns of sore throat as well as sensation of ear fullness and fluid in the ears.  She reports that symptoms started yesterday afternoon and have minimally improved with home measures.  Physical exam is overall reassuring.  She does have evidence of middle ear effusion but no sign of infection bilaterally.  Rapid strep was negative.  Will send for culture for definitive rule out, results to dictate further management.  At this time I suspect likely viral illness but cannot rule out eustachian tube dysfunction.  Recommend over-the-counter medications as needed for further symptomatic relief.  ED and return precautions reviewed and provided after visit summary.  Follow-up as needed    Discharge Instructions      You were seen today for concerns of a sore throat as well as fluid in the ears.  Your rapid strep test was negative.  I have sent a sample off for strep culture for  definitive rule out.  We will keep you updated on these results once they are available.   Your physical exam did demonstrate some mild redness along the back of the throat as well as signs of a middle ear effusion which means that there is fluid behind the eardrum.  Your ears do not appear to have an infection and I recommend the following to help with the fluid level: I also recommend adding an antihistamine to your daily regimen This includes medications like Claritin, Allegra, Zyrtec- the generics of these work very well and are usually less expensive I recommend using Flonase nasal spray - 2  puffs twice per day to help with your nasal congestion The antihistamines and Flonase can take a few weeks to provide significant relief from allergy symptoms but should start to provide some benefit soon. If you start to develop further symptoms I recommend over-the-counter medications such as DayQuil/NyQuil, TheraFlu, Alka-Seltzer per your preference as needed. If you feel like your symptoms are progressing and do not seem to be getting better with over-the-counter medications you can always return to urgent care or follow-up with your primary care provider for further evaluation.   ED Prescriptions   None    PDMP not reviewed this encounter.   Jerona Mooring, PA-C 01/07/24 1434

## 2024-01-07 NOTE — Discharge Instructions (Signed)
 You were seen today for concerns of a sore throat as well as fluid in the ears.  Your rapid strep test was negative.  I have sent a sample off for strep culture for definitive rule out.  We will keep you updated on these results once they are available.   Your physical exam did demonstrate some mild redness along the back of the throat as well as signs of a middle ear effusion which means that there is fluid behind the eardrum.  Your ears do not appear to have an infection and I recommend the following to help with the fluid level: I also recommend adding an antihistamine to your daily regimen This includes medications like Claritin, Allegra, Zyrtec- the generics of these work very well and are usually less expensive I recommend using Flonase nasal spray - 2 puffs twice per day to help with your nasal congestion The antihistamines and Flonase can take a few weeks to provide significant relief from allergy symptoms but should start to provide some benefit soon. If you start to develop further symptoms I recommend over-the-counter medications such as DayQuil/NyQuil, TheraFlu, Alka-Seltzer per your preference as needed. If you feel like your symptoms are progressing and do not seem to be getting better with over-the-counter medications you can always return to urgent care or follow-up with your primary care provider for further evaluation.

## 2024-01-10 ENCOUNTER — Ambulatory Visit (HOSPITAL_COMMUNITY): Payer: Self-pay

## 2024-01-10 ENCOUNTER — Telehealth (HOSPITAL_COMMUNITY): Payer: Self-pay

## 2024-01-10 LAB — CULTURE, GROUP A STREP (THRC)

## 2024-01-10 NOTE — Telephone Encounter (Signed)
 Documented in sep call.

## 2024-01-11 ENCOUNTER — Telehealth (HOSPITAL_COMMUNITY): Payer: Self-pay | Admitting: *Deleted

## 2024-01-11 ENCOUNTER — Telehealth (HOSPITAL_COMMUNITY): Payer: Self-pay

## 2024-01-11 NOTE — Telephone Encounter (Signed)
 Thank you for this update

## 2024-01-11 NOTE — Telephone Encounter (Signed)
 Fax received for PA approval of Aripiprazole  15mg  until 02/06/25. Called to notify pharmacy.

## 2024-01-11 NOTE — Telephone Encounter (Signed)
 Pts   PA was started for her Abilify , Via cover my meds which has been approved from 01/11/2024- 01/10/2025

## 2024-01-13 ENCOUNTER — Ambulatory Visit
Admission: RE | Admit: 2024-01-13 | Discharge: 2024-01-13 | Disposition: A | Discharge status: Home / Self Care | Source: Ambulatory Visit | Attending: Physician Assistant | Admitting: Physician Assistant

## 2024-01-13 ENCOUNTER — Other Ambulatory Visit (HOSPITAL_COMMUNITY): Payer: Self-pay

## 2024-01-13 ENCOUNTER — Other Ambulatory Visit: Payer: Self-pay

## 2024-01-13 VITALS — BP 149/104 | HR 105 | Temp 98.2°F | Resp 16 | Ht 64.0 in | Wt 125.0 lb

## 2024-01-13 DIAGNOSIS — H68003 Unspecified Eustachian salpingitis, bilateral: Secondary | ICD-10-CM

## 2024-01-13 DIAGNOSIS — H9201 Otalgia, right ear: Secondary | ICD-10-CM

## 2024-01-13 MED ORDER — DICLOFENAC POTASSIUM 50 MG PO TABS
50.0000 mg | ORAL_TABLET | Freq: Two times a day (BID) | ORAL | 0 refills | Status: DC
Start: 2024-01-13 — End: 2024-01-13

## 2024-01-13 MED ORDER — CIPROFLOXACIN-DEXAMETHASONE 0.3-0.1 % OT SUSP: 4.0000 [drp] | Freq: Two times a day (BID) | OTIC | 0 refills | Status: DC

## 2024-01-13 MED ORDER — DICLOFENAC SODIUM 75 MG PO TBEC
75.0000 mg | DELAYED_RELEASE_TABLET | Freq: Two times a day (BID) | ORAL | 0 refills | Status: DC
Start: 2024-01-13 — End: 2024-01-14

## 2024-01-13 MED ORDER — HYDROCORTISONE-ACETIC ACID 1-2 % OT SOLN: 3.0000 [drp] | Freq: Three times a day (TID) | OTIC | 0 refills | Status: DC

## 2024-01-13 NOTE — Discharge Instructions (Addendum)
 You were seen today for concerns of continued ear pain particularly on the right side.  At this time your eardrum looks like it is improved since you were last seen which I believe means that we are on the right track with your regimen.  Please continue to take the Flonase and Zyrtec as directed as this will help with the eustachian tube dysfunction.  To help with your discomfort and to help prevent potential ear canal infection I am sending in a medication called acetic acid-hydrocortisone eardrops.  You can place about 3 drops into both ears about 3 times per day.  The hydrocortisone and this should help reduce inflammation and irritation of the canal and hopefully reduce pain.  I have also sent in a medication called diclofenac 50 mg for you to take as needed up to twice per day.  This medication is an NSAID so please do not take it with other NSAIDs such as ibuprofen , naproxen, Aleve, Motrin , Advil .  You can take Tylenol  as needed for further pain relief. If your symptoms seem to be lingering over the next 2 weeks or seem to be worsening you can always return here or follow-up with your primary care provider for further evaluation and ongoing management.

## 2024-01-13 NOTE — ED Triage Notes (Signed)
 Pt presents with complaints of right ear pain that started yesterday 6/4. Pt currently rates her overall ear pain a 9/10. Pt states there has been fluid behind the ears for about a week. OTC Ibuprofen  taken with little to no pain relief.

## 2024-01-13 NOTE — ED Provider Notes (Signed)
 [name]    CSN: [id number] Arrival date & time: 01/13/24  0903      History   Chief Complaint Chief Complaint  Patient presents with   Otalgia    Had fluid behind my eardrums at my visit last week, this afternoon my right ear became excruciatingly painful - Entered by patient    HPI KATALEAH BEJAR is a 43 y.o. female.   HPI  Pt presents today with concerns for persistent ear pain with R>L  She states yesterday the pain was very bad  She reports that she did have some improvement with her previous symptoms after using Flonase and antihistamine but ear pain is still prevalent    Past Medical History:  Diagnosis Date   ADHD    Anxiety    Bipolar 2 disorder (HCC)    Depression    Ectopic pregnancy with intrauterine pregnancy/Heterotopic Pregnancy 05/03/2019   S/p laparoscopic right salpingectomy on 05/03/19 05/03/19 SURGICAL PATHOLOGY FALLOPIAN TUBE WITH ECTOPIC PREGNANCY, RIGHT, SALPINGECTOMY:  - Benign fallopian tube with chorionic villi consistent with ectopic pregnancy.    Hemoperitoneum due to rupture of right tubal ectopic pregnancy 05/03/2019   History of ectopic pregnancy in 11/2018 11/20/2018   Mtx#1 given 4/3: Hcg day 0 (147), day 4 (180), day 7 (193) Mtx # 2 given 4/9 (193)->4/12 (153)->4/15 (79)   Hyperlipidemia    Infection    UTI   Pregnancy induced hypertension    Psoriasis 2020   Seasonal allergies     Patient Active Problem List   Diagnosis Date Noted   Dermatofibroma of left upper arm 11/30/2022   Tobacco use disorder 11/30/2022   Attention deficit hyperactivity disorder (ADHD), predominantly inattentive type 10/02/2020   Bipolar disorder (HCC) 06/05/2020   Hypercholesteremia 02/19/2019    Past Surgical History:  Procedure Laterality Date   APPENDECTOMY     BUNIONECTOMY     CESAREAN SECTION  2017   CESAREAN SECTION N/A 11/27/2019   Procedure: CESAREAN SECTION AND IUD INSERTION;  Surgeon: Othelia Blinks, MD;  Location: [facility] LD  ORS;  Service: Obstetrics;  Laterality: N/A;   DIAGNOSTIC LAPAROSCOPY WITH REMOVAL OF ECTOPIC PREGNANCY N/A 05/03/2019   Procedure: RIGHT SALPINGECTOMY AND REMOVAL OF ECTOPIC PREGNANCY;  Surgeon: Julianne Octave, MD;  Location: [facility] OR;  Service: Gynecology;  Laterality: N/A;   LAPAROSCOPIC BILATERAL SALPINGECTOMY Left 02/17/2022   Procedure: LAPAROSCOPIC LEFT SALPINGECTOMY;  Surgeon: Abigail Abler, MD;  Location: [facility] OR;  Service: Gynecology;  Laterality: Left;   TONSILLECTOMY     TUBAL LIGATION      OB History     Gravida  6   Para  2   Term  2   Preterm      AB  3   Living  2      SAB      IAB  1   Ectopic  2   Multiple  0   Live Births  2            Home Medications    Prior to Admission medications   Medication Sig Start Date End Date Taking? Authorizing Provider  ciprofloxacin-dexamethasone  (CIPRODEX) OTIC suspension Place 4 drops into both ears 2 (two) times daily. 01/13/24  Yes Davonna Ertl E, PA-C  diclofenac (VOLTAREN) 75 MG EC tablet Take 1 tablet (75 mg total) by mouth 2 (two) times daily. 01/13/24  Yes Othell Diluzio E, PA-C  amphetamine -dextroamphetamine (ADDERALL XR) 15 MG 24 hr capsule Patient to take 1 tablet  in the morning and evening 01/07/24   Ardena Koyanagi E, NP  ARIPiprazole  (ABILIFY ) 15 MG tablet Take 1 tablet (15 mg total) by mouth daily. 01/07/24   Arlyne Bering, NP  sertraline  (ZOLOFT ) 100 MG tablet Take 1.5 tablets (150 mg total) by mouth daily with breakfast. 01/07/24   Arlyne Bering, NP    Family History Family History  Problem Relation Age of Onset   Hyperlipidemia Mother    Hypertension Mother    Hyperlipidemia Father    Hypertension Father    Cancer Father        prostate   Depression Sister     Social History Social History   Tobacco Use   Smoking status: Every Day    Current packs/day: 0.00    Types: Cigarettes    Last attempt to quit: 2017    Years since quitting: 8.4   Smokeless tobacco: Never  Vaping  Use   Vaping status: Some Days  Substance Use Topics   Alcohol use: No   Drug use: No     Allergies   Lamictal [lamotrigine]   Review of Systems Review of Systems  Constitutional:  Negative for chills and fever.  HENT:  Positive for ear pain and sinus pressure. Negative for ear discharge, sinus pain and sore throat (Improved since last week but intermittent).   Respiratory:  Negative for cough and shortness of breath.      Physical Exam Triage Vital Signs ED Triage Vitals  Encounter Vitals Group     BP 01/13/24 0906 (!) 152/101     Systolic BP Percentile --      Diastolic BP Percentile --      Pulse Rate 01/13/24 0906 (!) 105     Resp 01/13/24 0906 16     Temp 01/13/24 0906 98.2 F (36.8 C)     Temp Source 01/13/24 0906 Oral     SpO2 01/13/24 0906 98 %     Weight 01/13/24 0906 125 lb (56.7 kg)     Height 01/13/24 0906 5\' 4"  (1.626 m)     Head Circumference --      Peak Flow --      Pain Score 01/13/24 0914 9     Pain Loc --      Pain Education --      Exclude from Growth Chart --    No data found.  Updated Vital Signs BP (!) 149/104 (BP Location: Right Arm)   Pulse (!) 105   Temp 98.2 F (36.8 C) (Oral)   Resp 16   Ht 5\' 4"  (1.626 m)   Wt 125 lb (56.7 kg)   LMP 01/01/2024 (Exact Date)   SpO2 98%   BMI 21.46 kg/m   Visual Acuity Right Eye Distance:   Left Eye Distance:   Bilateral Distance:    Right Eye Near:   Left Eye Near:    Bilateral Near:     Physical Exam Vitals reviewed.  Constitutional:      General: She is awake.     Appearance: Normal appearance. She is well-developed and well-groomed.  HENT:     Head: Normocephalic and atraumatic.     Right Ear: Hearing and external ear normal. Tenderness present. No drainage. Tympanic membrane is erythematous. Tympanic membrane is not injected, scarred, perforated, retracted or bulging.     Left Ear: Hearing and external ear normal. Tenderness present. No drainage. A middle ear effusion is present.   Eyes:     General: Lids are  normal. Gaze aligned appropriately.     Extraocular Movements: Extraocular movements intact.     Conjunctiva/sclera: Conjunctivae normal.  Pulmonary:     Effort: Pulmonary effort is normal.  Neurological:     General: No focal deficit present.     Mental Status: She is alert and oriented to person, place, and time.     GCS: GCS eye subscore is 4. GCS verbal subscore is 5. GCS motor subscore is 6.     Cranial Nerves: No cranial nerve deficit, dysarthria or facial asymmetry.  Psychiatric:        Attention and Perception: Attention and perception normal.        Mood and Affect: Mood and affect normal.        Speech: Speech normal.        Behavior: Behavior normal. Behavior is cooperative.      UC Treatments / Results  Labs (all labs ordered are listed, but only abnormal results are displayed) Labs Reviewed - No data to display  EKG   Radiology No results found.  Procedures Procedures (including critical care time)  Medications Ordered in UC Medications - No data to display  Initial Impression / Assessment and Plan / UC Course  I have reviewed the triage vital signs and the nursing notes.  Pertinent labs & imaging results that were available during my care of the patient were reviewed by me and considered in my medical decision making (see chart for details).      Final Clinical Impressions(s) / UC Diagnoses   Final diagnoses:  Right ear pain  Eustachian catarrh, bilateral  Patient presents today with concerns for continued right ear pain and sensation of fluid in the ears.  She reports that her symptoms seem to have gotten mildly better but yesterday she did have more severe ear pain on the right side that was worrisome.  Physical exam is notable for erythematous right ear canal and mildly erythematous right TM but no signs of opacity or obvious ear infection.  I reviewed with patient that her tympanic membrane did not appear as bulging as  when she was previously evaluated.  Right tympanic membrane has small fluid bubble present but is also not bulging and is not erythematous. At this time suspect that she continues to have eustachian tube dysfunction and recommend continued use of Zyrtec and Flonase.  Given patient's severe pain and erythematous canal will send in ciprofloxacin-dexamethasone  otic solution to help prevent/treat otitis externa.  Will also send in diclofenac 75 mg p.o. twice daily to assist with pain.  Reviewed that she should not take other NSAIDs while taking this and can use Tylenol  as needed for further pain management.  ED and return precautions reviewed and provided after visit summary.  Follow-up as needed.   Discharge Instructions      You were seen today for concerns of continued ear pain particularly on the right side.  At this time your eardrum looks like it is improved since you were last seen which I believe means that we are on the right track with your regimen.  Please continue to take the Flonase and Zyrtec as directed as this will help with the eustachian tube dysfunction.  To help with your discomfort and to help prevent potential ear canal infection I am sending in a medication called acetic acid-hydrocortisone eardrops.  You can place about 3 drops into both ears about 3 times per day.  The hydrocortisone and this should help reduce inflammation and irritation of  the canal and hopefully reduce pain.  I have also sent in a medication called diclofenac 50 mg for you to take as needed up to twice per day.  This medication is an NSAID so please do not take it with other NSAIDs such as ibuprofen , naproxen, Aleve, Motrin , Advil .  You can take Tylenol  as needed for further pain relief. If your symptoms seem to be lingering over the next 2 weeks or seem to be worsening you can always return here or follow-up with your primary care provider for further evaluation and ongoing management.    ED Prescriptions      Medication Sig Dispense Auth. Provider   acetic acid-hydrocortisone (VOSOL-HC) OTIC solution  (Status: Discontinued) Place 3 drops into both ears 3 (three) times daily. 10 mL Karolina Zamor E, PA-C   diclofenac (CATAFLAM) 50 MG tablet  (Status: Discontinued) Take 1 tablet (50 mg total) by mouth 2 (two) times daily. 30 tablet Densil Ottey E, PA-C   ciprofloxacin-dexamethasone  (CIPRODEX) OTIC suspension Place 4 drops into both ears 2 (two) times daily. 7.5 mL Xaviera Flaten E, PA-C   diclofenac (VOLTAREN) 75 MG EC tablet Take 1 tablet (75 mg total) by mouth 2 (two) times daily. 30 tablet Ezmae Speers E, PA-C      PDMP not reviewed this encounter.   Bion Todorov, Pearla Bottom, PA-C 01/13/24 1031

## 2024-01-14 ENCOUNTER — Other Ambulatory Visit (HOSPITAL_COMMUNITY): Payer: Self-pay

## 2024-01-14 ENCOUNTER — Telehealth: Payer: Self-pay | Admitting: Pharmacy Technician

## 2024-01-14 DIAGNOSIS — H9203 Otalgia, bilateral: Secondary | ICD-10-CM

## 2024-01-14 MED ORDER — IBUPROFEN 600 MG PO TABS: 600.0000 mg | ORAL_TABLET | Freq: Three times a day (TID) | ORAL | 0 refills | Status: DC | PRN

## 2024-01-14 NOTE — Telephone Encounter (Signed)
 Pharmacy Patient Advocate Encounter   Received notification from CoverMyMeds that prior authorization for Diclofenac Sodium 75MG  dr tablets is required/requested.   Insurance verification completed.   The patient is insured through [facility] .   Per test claim:  IBUPROFEN , NAPROXEN, MELOXICAM,CELEBREX is preferred by the insurance.  If suggested medication is appropriate, Please send in a new RX and discontinue this one. If not, please advise as to why it's not appropriate so that we may request a Prior Authorization. Please note, some preferred medications may still require a PA.  If the suggested medications have not been trialed and there are no contraindications to their use, the PA will not be submitted, as it will not be approved.

## 2024-01-17 ENCOUNTER — Other Ambulatory Visit (HOSPITAL_COMMUNITY): Payer: Self-pay

## 2024-01-28 ENCOUNTER — Ambulatory Visit
Admission: RE | Admit: 2024-01-28 | Discharge: 2024-01-28 | Disposition: A | Discharge status: Home / Self Care | Source: Ambulatory Visit | Attending: Internal Medicine

## 2024-01-28 VITALS — BP 124/91 | HR 88 | Temp 97.9°F | Resp 16

## 2024-01-28 DIAGNOSIS — Z20818 Contact with and (suspected) exposure to other bacterial communicable diseases: Secondary | ICD-10-CM

## 2024-01-28 DIAGNOSIS — J069 Acute upper respiratory infection, unspecified: Secondary | ICD-10-CM | POA: Insufficient documentation

## 2024-01-28 DIAGNOSIS — J029 Acute pharyngitis, unspecified: Secondary | ICD-10-CM | POA: Diagnosis not present

## 2024-01-28 LAB — POCT RAPID STREP A (OFFICE): Rapid Strep A Screen: NEGATIVE

## 2024-01-28 NOTE — Discharge Instructions (Addendum)
 You have a viral illness which will improve on its own with rest, fluids, and medications to help with your symptoms.  Strep test here is negative, throat culture is pending. Staff will call if throat culture shows any bacterial growth requiring treatment with antibiotics. If throat culture is negative, you will not receive a phone call from us . You may also see these results on MyChart.   Tylenol , guaifenesin (plain mucinex), and saline nasal sprays may help relieve symptoms.   Two teaspoons of honey in 1 cup of warm water  every 4-6 hours may help with throat pains.  Humidifier in room at nighttime may help soothe cough (clean well daily).   For chest pain, shortness of breath, inability to keep food or fluids down without vomiting, fever that does not respond to tylenol  or motrin , or any other severe symptoms, please go to the ER for further evaluation. Return to urgent care as needed, otherwise follow-up with PCP.

## 2024-01-28 NOTE — ED Triage Notes (Signed)
 Pt states he boss has strep throat and this morning her throat became sore.

## 2024-01-28 NOTE — ED Provider Notes (Signed)
 [name]    CSN: [id number] Arrival date & time: 01/28/24  1026      History   Chief Complaint Chief Complaint  Patient presents with   Sore Throat    Boss has strep throat - Entered by patient    HPI Tanya Moore is a 43 y.o. female.   Tanya Moore is a 43 y.o. female presenting for chief complaint of Sore Throat, congestion, and mild throat clearing cough that started yesterday.  She states she has been exposed to her boss at work who currently has strep throat.  Sore throat is worsened by swallowing and coughing.  Denies recent fever, chills, nausea, vomiting, rash, dizziness, headache, and abdominal pain.  Denies recent antibiotic or steroid use in the last 90 days.  No difficulty swallowing or maintaining secretions.  She has been using cough drops (Ricola honey lemon) with some relief of sore throat.  She has not attempted use of any Tylenol  or ibuprofen  prior to arrival.   Sore Throat    Past Medical History:  Diagnosis Date   ADHD    Anxiety    Bipolar 2 disorder (HCC)    Depression    Ectopic pregnancy with intrauterine pregnancy/Heterotopic Pregnancy 05/03/2019   S/p laparoscopic right salpingectomy on 05/03/19 05/03/19 SURGICAL PATHOLOGY FALLOPIAN TUBE WITH ECTOPIC PREGNANCY, RIGHT, SALPINGECTOMY:  - Benign fallopian tube with chorionic villi consistent with ectopic pregnancy.    Hemoperitoneum due to rupture of right tubal ectopic pregnancy 05/03/2019   History of ectopic pregnancy in 11/2018 11/20/2018   Mtx#1 given 4/3: Hcg day 0 (147), day 4 (180), day 7 (193) Mtx # 2 given 4/9 (193)->4/12 (153)->4/15 (79)   Hyperlipidemia    Infection    UTI   Pregnancy induced hypertension    Psoriasis 2020   Seasonal allergies     Patient Active Problem List   Diagnosis Date Noted   Dermatofibroma of left upper arm 11/30/2022   Tobacco use disorder 11/30/2022   Attention deficit hyperactivity disorder (ADHD), predominantly inattentive type  10/02/2020   Bipolar disorder (HCC) 06/05/2020   Hypercholesteremia 02/19/2019    Past Surgical History:  Procedure Laterality Date   APPENDECTOMY     BUNIONECTOMY     CESAREAN SECTION  2017   CESAREAN SECTION N/A 11/27/2019   Procedure: CESAREAN SECTION AND IUD INSERTION;  Surgeon: Othelia Blinks, MD;  Location: [facility] LD ORS;  Service: Obstetrics;  Laterality: N/A;   DIAGNOSTIC LAPAROSCOPY WITH REMOVAL OF ECTOPIC PREGNANCY N/A 05/03/2019   Procedure: RIGHT SALPINGECTOMY AND REMOVAL OF ECTOPIC PREGNANCY;  Surgeon: Julianne Octave, MD;  Location: [facility] OR;  Service: Gynecology;  Laterality: N/A;   LAPAROSCOPIC BILATERAL SALPINGECTOMY Left 02/17/2022   Procedure: LAPAROSCOPIC LEFT SALPINGECTOMY;  Surgeon: Abigail Abler, MD;  Location: [facility] OR;  Service: Gynecology;  Laterality: Left;   TONSILLECTOMY     TUBAL LIGATION      OB History     Gravida  6   Para  2   Term  2   Preterm      AB  3   Living  2      SAB      IAB  1   Ectopic  2   Multiple  0   Live Births  2            Home Medications    Prior to Admission medications   Medication Sig Start Date End Date Taking? Authorizing Provider  amphetamine -dextroamphetamine (ADDERALL XR) 15  MG 24 hr capsule Patient to take 1 tablet in the morning and evening 01/07/24   Parsons, Brittney E, NP  ARIPiprazole  (ABILIFY ) 15 MG tablet Take 1 tablet (15 mg total) by mouth daily. 01/07/24   Arlyne Bering, NP  ciprofloxacin -dexamethasone  (CIPRODEX ) OTIC suspension Place 4 drops into both ears 2 (two) times daily. 01/13/24   Mecum, Erin E, PA-C  ibuprofen  (ADVIL ) 600 MG tablet Take 1 tablet (600 mg total) by mouth every 8 (eight) hours as needed. 01/14/24   Naida Austria, MD  sertraline  (ZOLOFT ) 100 MG tablet Take 1.5 tablets (150 mg total) by mouth daily with breakfast. 01/07/24   Arlyne Bering, NP    Family History Family History  Problem Relation Age of Onset   Hyperlipidemia Mother    Hypertension Mother     Hyperlipidemia Father    Hypertension Father    Cancer Father        prostate   Depression Sister     Social History Social History   Tobacco Use   Smoking status: Every Day    Current packs/day: 0.00    Types: Cigarettes    Last attempt to quit: 2017    Years since quitting: 8.4   Smokeless tobacco: Never  Vaping Use   Vaping status: Some Days  Substance Use Topics   Alcohol use: No   Drug use: No     Allergies   Lamictal [lamotrigine]   Review of Systems Review of Systems Per HPI  Physical Exam Triage Vital Signs ED Triage Vitals  Encounter Vitals Group     BP 01/28/24 1031 (!) 124/91     Girls Systolic BP Percentile --      Girls Diastolic BP Percentile --      Boys Systolic BP Percentile --      Boys Diastolic BP Percentile --      Pulse Rate 01/28/24 1031 88     Resp 01/28/24 1031 16     Temp 01/28/24 1031 97.9 F (36.6 C)     Temp Source 01/28/24 1031 Oral     SpO2 01/28/24 1031 99 %     Weight --      Height --      Head Circumference --      Peak Flow --      Pain Score 01/28/24 1033 5     Pain Loc --      Pain Education --      Exclude from Growth Chart --    No data found.  Updated Vital Signs BP (!) 124/91 (BP Location: Right Arm)   Pulse 88   Temp 97.9 F (36.6 C) (Oral)   Resp 16   LMP 01/01/2024 (Exact Date)   SpO2 99%   Visual Acuity Right Eye Distance:   Left Eye Distance:   Bilateral Distance:    Right Eye Near:   Left Eye Near:    Bilateral Near:     Physical Exam Vitals and nursing note reviewed.  Constitutional:      Appearance: She is not ill-appearing or toxic-appearing.  HENT:     Head: Normocephalic and atraumatic.     Right Ear: Hearing, tympanic membrane, ear canal and external ear normal.     Left Ear: Hearing, tympanic membrane, ear canal and external ear normal.     Nose: Nose normal.     Mouth/Throat:     Lips: Pink.     Mouth: Mucous membranes are moist. No injury or oral lesions.  Dentition:  Normal dentition.     Tongue: No lesions.     Pharynx: Oropharynx is clear. Uvula midline. Posterior oropharyngeal erythema present. No pharyngeal swelling, oropharyngeal exudate, uvula swelling or postnasal drip.     Tonsils: No tonsillar exudate.     Comments: No trismus, phonation normal, maintaining secretions without difficulty.   Eyes:     General: Lids are normal. Vision grossly intact. Gaze aligned appropriately.     Extraocular Movements: Extraocular movements intact.     Conjunctiva/sclera: Conjunctivae normal.   Neck:     Trachea: Trachea and phonation normal.   Cardiovascular:     Rate and Rhythm: Normal rate and regular rhythm.     Heart sounds: Normal heart sounds, S1 normal and S2 normal.  Pulmonary:     Effort: Pulmonary effort is normal. No respiratory distress.     Breath sounds: Normal breath sounds and air entry.   Musculoskeletal:     Cervical back: Neck supple.  Lymphadenopathy:     Cervical: No cervical adenopathy.   Skin:    General: Skin is warm and dry.     Capillary Refill: Capillary refill takes less than 2 seconds.     Findings: No rash.   Neurological:     General: No focal deficit present.     Mental Status: She is alert and oriented to person, place, and time. Mental status is at baseline.     Cranial Nerves: No dysarthria or facial asymmetry.   Psychiatric:        Mood and Affect: Mood normal.        Speech: Speech normal.        Behavior: Behavior normal.        Thought Content: Thought content normal.        Judgment: Judgment normal.      UC Treatments / Results  Labs (all labs ordered are listed, but only abnormal results are displayed) Labs Reviewed  POCT RAPID STREP A (OFFICE)    EKG   Radiology No results found.  Procedures Procedures (including critical care time)  Medications Ordered in UC Medications - No data to display  Initial Impression / Assessment and Plan / UC Course  I have reviewed the triage vital  signs and the nursing notes.  Pertinent labs & imaging results that were available during my care of the patient were reviewed by me and considered in my medical decision making (see chart for details).   1. Viral pharyngitis, viral URI with cough Suspect viral URI, viral syndrome.  Strep/viral testing: POC strep negative, throat culture pending.   Physical exam findings reassuring, vital signs hemodynamically stable, and lungs clear, therefore deferred imaging of the chest.  Advised supportive care/prescriptions for symptomatic relief as outlined in AVS.    Counseled patient on potential for adverse effects with medications prescribed/recommended today, strict ER and return-to-clinic precautions discussed, patient verbalized understanding.    Final Clinical Impressions(s) / UC Diagnoses   Final diagnoses:  Viral pharyngitis  Viral URI with cough     Discharge Instructions      You have a viral illness which will improve on its own with rest, fluids, and medications to help with your symptoms.  Strep test here is negative, throat culture is pending. Staff will call if throat culture shows any bacterial growth requiring treatment with antibiotics. If throat culture is negative, you will not receive a phone call from us . You may also see these results on MyChart.   Tylenol , guaifenesin (  plain mucinex), and saline nasal sprays may help relieve symptoms.   Two teaspoons of honey in 1 cup of warm water  every 4-6 hours may help with throat pains.  Humidifier in room at nighttime may help soothe cough (clean well daily).   For chest pain, shortness of breath, inability to keep food or fluids down without vomiting, fever that does not respond to tylenol  or motrin , or any other severe symptoms, please go to the ER for further evaluation. Return to urgent care as needed, otherwise follow-up with PCP.       ED Prescriptions   None    PDMP not reviewed this encounter.   Starlene Eaton, [place] 01/28/24 1044

## 2024-01-31 ENCOUNTER — Ambulatory Visit (HOSPITAL_COMMUNITY): Payer: Self-pay

## 2024-01-31 LAB — CULTURE, GROUP A STREP (THRC)

## 2024-02-15 DIAGNOSIS — F3181 Bipolar II disorder: Secondary | ICD-10-CM | POA: Diagnosis not present

## 2024-02-15 DIAGNOSIS — F900 Attention-deficit hyperactivity disorder, predominantly inattentive type: Secondary | ICD-10-CM | POA: Diagnosis not present

## 2024-02-16 LAB — CBC WITH DIFFERENTIAL/PLATELET
Basophils Absolute: 0.1 x10E3/uL (ref 0.0–0.2)
Basos: 1 %
EOS (ABSOLUTE): 0.1 x10E3/uL (ref 0.0–0.4)
Eos: 1 %
Hematocrit: 43.9 % (ref 34.0–46.6)
Hemoglobin: 14.3 g/dL (ref 11.1–15.9)
Immature Grans (Abs): 0 x10E3/uL (ref 0.0–0.1)
Immature Granulocytes: 0 %
Lymphocytes Absolute: 2.8 x10E3/uL (ref 0.7–3.1)
Lymphs: 31 %
MCH: 29.4 pg (ref 26.6–33.0)
MCHC: 32.6 g/dL (ref 31.5–35.7)
MCV: 90 fL (ref 79–97)
Monocytes Absolute: 0.4 x10E3/uL (ref 0.1–0.9)
Monocytes: 5 %
Neutrophils Absolute: 5.5 x10E3/uL (ref 1.4–7.0)
Neutrophils: 62 %
Platelets: 300 x10E3/uL (ref 150–450)
RBC: 4.87 x10E6/uL (ref 3.77–5.28)
RDW: 12.6 % (ref 11.7–15.4)
WBC: 8.8 x10E3/uL (ref 3.4–10.8)

## 2024-02-16 LAB — COMPREHENSIVE METABOLIC PANEL WITH GFR
ALT: 18 IU/L (ref 0–32)
AST: 16 IU/L (ref 0–40)
Albumin: 4.7 g/dL (ref 3.9–4.9)
Alkaline Phosphatase: 102 IU/L (ref 44–121)
BUN/Creatinine Ratio: 17 (ref 9–23)
BUN: 12 mg/dL (ref 6–24)
Bilirubin Total: 0.3 mg/dL (ref 0.0–1.2)
CO2: 24 mmol/L (ref 20–29)
Calcium: 9.8 mg/dL (ref 8.7–10.2)
Chloride: 102 mmol/L (ref 96–106)
Creatinine, Ser: 0.71 mg/dL (ref 0.57–1.00)
Globulin, Total: 2.6 g/dL (ref 1.5–4.5)
Glucose: 89 mg/dL (ref 70–99)
Potassium: 4.1 mmol/L (ref 3.5–5.2)
Sodium: 143 mmol/L (ref 134–144)
Total Protein: 7.3 g/dL (ref 6.0–8.5)
eGFR: 108 mL/min/1.73 (ref >59)

## 2024-02-16 LAB — HEPATIC FUNCTION PANEL
ALT: 17 IU/L (ref 0–32)
AST: 16 IU/L (ref 0–40)
Albumin: 4.8 g/dL (ref 3.9–4.9)
Alkaline Phosphatase: 105 IU/L (ref 44–121)
Bilirubin Total: 0.3 mg/dL (ref 0.0–1.2)
Bilirubin, Direct: 0.12 mg/dL (ref 0.00–0.40)
Total Protein: 7.2 g/dL (ref 6.0–8.5)

## 2024-02-16 LAB — THYROID PANEL WITH TSH
Free Thyroxine Index: 2.7 (ref 1.2–4.9)
T3 Uptake Ratio: 34 % (ref 24–39)
T4, Total: 8 ug/dL (ref 4.5–12.0)
TSH: 0.698 u[IU]/mL (ref 0.450–4.500)

## 2024-02-16 LAB — HEMOGLOBIN A1C
Est. average glucose Bld gHb Est-mCnc: 108 mg/dL
Hgb A1c MFr Bld: 5.4 % (ref 4.8–5.6)

## 2024-02-20 LAB — URINE DRUG PANEL 7
Barbiturate Quant, Ur: NEGATIVE ng/mL
Benzodiazepine Quant, Ur: NEGATIVE ng/mL
Cocaine (Metab.): NEGATIVE ng/mL
Creatinine, Urine: 154.7 mg/dL (ref 20.0–300.0)
Nitrite Urine, Quantitative: NEGATIVE ug/mL
OPIATE SCREEN URINE: NEGATIVE ng/mL
PCP Quant, Ur: NEGATIVE ng/mL
pH, Urine: 5.7 (ref 4.5–8.9)

## 2024-02-20 LAB — AMPHETAMINE CONF, UR
Amphetamine GC/MS Conf: >3000 ng/mL
Amphetamine: POSITIVE — AB
Amphetamines: POSITIVE — AB
Methamphetamine: NEGATIVE

## 2024-02-20 LAB — CANNABINOID CONFIRMATION, UR
CANNABINOIDS: POSITIVE — AB
Carboxy THC GC/MS Conf: 508 ng/mL

## 2024-02-23 ENCOUNTER — Other Ambulatory Visit (HOSPITAL_COMMUNITY): Payer: Self-pay | Admitting: Physician Assistant

## 2024-02-23 ENCOUNTER — Telehealth (HOSPITAL_COMMUNITY): Payer: Self-pay

## 2024-02-23 DIAGNOSIS — F900 Attention-deficit hyperactivity disorder, predominantly inattentive type: Secondary | ICD-10-CM

## 2024-02-23 MED ORDER — AMPHETAMINE-DEXTROAMPHET ER 15 MG PO CP24: ORAL_CAPSULE | ORAL | 0 refills | Status: DC

## 2024-02-23 NOTE — Telephone Encounter (Signed)
 Hello,   Pt called today requesting for a refill of adderall. Pt canceled last app with Dr.Eddie but rescheduled with provider for August 29 th.   JNL, CMA

## 2024-02-23 NOTE — Telephone Encounter (Signed)
 Message acknowledged and reviewed.  Urine drug screen was completed by patient.  Patient medication to be e-prescribed to pharmacy of choice.

## 2024-02-23 NOTE — Progress Notes (Signed)
 Provider was contacted by Ja'Bron N. Lewis, CMA regarding patient's medication refill requests (alcohol).  Patient's last prescription was filled on 01/07/2024.  Urine drug screen was performed on 02/15/2024.  PDMP was reviewed prior to refilling Adderall prescription.  Patient's medication to be e-prescribed to pharmacy of choice.

## 2024-02-25 ENCOUNTER — Ambulatory Visit: Admitting: Family Medicine

## 2024-02-25 VITALS — BP 130/82 | HR 88 | Ht 64.0 in | Wt 123.4 lb

## 2024-02-25 DIAGNOSIS — Z1231 Encounter for screening mammogram for malignant neoplasm of breast: Secondary | ICD-10-CM | POA: Diagnosis not present

## 2024-02-25 DIAGNOSIS — N951 Menopausal and female climacteric states: Secondary | ICD-10-CM

## 2024-02-25 MED ORDER — GABAPENTIN 300 MG PO CAPS: 300.0000 mg | ORAL_CAPSULE | Freq: Every day | ORAL | 0 refills | Status: AC

## 2024-02-25 NOTE — Patient Instructions (Addendum)
 Thank you for coming in today! Here is a summary of what we discussed:  -You need a mammogram to prevent breast cancer.  Please schedule an appointment.  You can call [phone].      - For your perimenopausal symptoms I prescribed gabapentin.  You should take 1 tablet at night.  If this goes well and you are still having symptoms, you can increase to 2 tablets a day.  - Please schedule follow-up appointment in 1 month to check in about your symptoms and change the medication if needed   Please call the clinic at [phone] if your symptoms worsen or you have any concerns.  Best, Dr Adele

## 2024-02-25 NOTE — Progress Notes (Signed)
    SUBJECTIVE:   Chief compliant/HPI: annual examination  Tanya Moore is a 43 y.o. who presents today for an annual exam.   History tabs reviewed and updated .   Review of systems form reviewed and notable for:  Wants bump on L arm examined- thinks it's gotten larger. No discharge, not painful recently  Hot flashes- started in the Fall, multiple times a day, don't last long. Periods started to become irregular in the Fall as well. Also having insomnia- not new for her but has gotten worse around December of 24.  Does not want to take OCPs due to concerns about mental health side effects Agreeable to taking gabapentin  OBJECTIVE:   BP 130/82   Pulse 88   Ht 5' 4 (1.626 m)   Wt 123 lb 6.4 oz (56 kg)   SpO2 100%   BMI 21.18 kg/m   General: Awake and conversant, no acute distress CV: RRR normal S1/S2, no M/R/G Pulm: CTAB, normal WOB on RA Neuro: No focal deficits Psych: Appropriate mood and affect  ASSESSMENT/PLAN:   Assessment & Plan Breast cancer screening by mammogram Last mammogram in 2023, was normal.  Ordered screening mammogram today. Perimenopause Symptoms of insomnia, hot flashes, menstrual irregularity interfering with quality of life.  Patient prefers not to take OCP due to concerns about mental health effects.  Will start low-dose gabapentin, instructed patient to take 1 nightly and increase to twice daily if well-tolerated and symptoms ongoing.  - gabapentin (NEURONTIN) 300 MG capsule; Take 1 capsule (300 mg total) by mouth at bedtime.  Dispense: 30 capsule; Refill: 0 - Follow-up in 1 month to titrate medicine as indicated  Annual Examination  See AVS for age appropriate recommendations.   PHQ score 5, reviewed and discussed. Sleep issues as above Blood pressure reviewed and at goal .  Asked about intimate partner violence and resources given as appropriate  The patient currently uses BTL for contraception.   Considered the following items based upon  USPSTF recommendations: Diabetes screening: Done through psych provider, 5.4 Screening for elevated cholesterol: Completed in January, LDL 129.  Patient actively making healthy dietary and physical activity choices.  May need to consider statin in the future HIV testing: Patient declined, no new sexual partners Hepatitis C: Declined.  Negative in 2021 Hepatitis B: Declined, no risk factors Syphilis if at high risk: Declined, no new sexual partners or risk factors GC/CT not at high risk and not ordered. Reviewed risk factors for latent tuberculosis and not indicated Reviewed risk factors for osteoporosis. Early screening not ordered.  Reports good dietary intake of vitamin D and calcium .   Discussed family history, BRCA testing not indicated.  Denies family history of breast cancer.  Cervical cancer screening: prior Pap reviewed, repeat due in Oct 2025 Breast cancer screening: discussed potential benefits, risks including overdiagnosis and biopsy, elected proceed with mammogram Colorectal cancer screening: not applicable given age.  if age 27 or over.   Follow up in 1 year or sooner if indicated.  MyChart Activation: Already signed up  Rea Raring, MD [facility]

## 2024-03-27 ENCOUNTER — Ambulatory Visit
Admission: RE | Admit: 2024-03-27 | Discharge: 2024-03-27 | Disposition: A | Discharge status: Home / Self Care | Source: Ambulatory Visit | Attending: Family Medicine | Admitting: Family Medicine

## 2024-03-27 DIAGNOSIS — Z1231 Encounter for screening mammogram for malignant neoplasm of breast: Secondary | ICD-10-CM

## 2024-04-04 ENCOUNTER — Telehealth (HOSPITAL_COMMUNITY): Admitting: Physician Assistant

## 2024-04-07 ENCOUNTER — Encounter (HOSPITAL_COMMUNITY): Payer: Self-pay | Admitting: Physician Assistant

## 2024-04-07 ENCOUNTER — Telehealth (HOSPITAL_COMMUNITY): Admitting: Physician Assistant

## 2024-04-07 DIAGNOSIS — F3181 Bipolar II disorder: Secondary | ICD-10-CM | POA: Diagnosis not present

## 2024-04-07 DIAGNOSIS — F900 Attention-deficit hyperactivity disorder, predominantly inattentive type: Secondary | ICD-10-CM | POA: Diagnosis not present

## 2024-04-07 MED ORDER — ARIPIPRAZOLE 15 MG PO TABS: 15.0000 mg | ORAL_TABLET | Freq: Every day | ORAL | 3 refills | Status: DC

## 2024-04-07 MED ORDER — AMPHETAMINE-DEXTROAMPHET ER 15 MG PO CP24: ORAL_CAPSULE | ORAL | 0 refills | Status: DC

## 2024-04-07 MED ORDER — SERTRALINE HCL 100 MG PO TABS: 150.0000 mg | ORAL_TABLET | Freq: Every day | ORAL | 3 refills | Status: DC

## 2024-04-07 NOTE — Progress Notes (Signed)
 [facility] MD/PA/NP OP Progress Note  Virtual Visit via Video Note  I connected with Tanya Moore on 04/07/24 at 10:30 AM EDT by a video enabled telemedicine application and verified that I am speaking with the correct person using two identifiers.  Location: Patient: Home Provider: Clinic   I discussed the limitations of evaluation and management by telemedicine and the availability of in person appointments. The patient expressed understanding and agreed to proceed.  Follow Up Instructions:  I discussed the assessment and treatment plan with the patient. The patient was provided an opportunity to ask questions and all were answered. The patient agreed with the plan and demonstrated an understanding of the instructions.   The patient was advised to call back or seek an in-person evaluation if the symptoms worsen or if the condition fails to improve as anticipated.  I provided 14 minutes of non-face-to-face time during this encounter.  Reginia FORBES Bolster, PA    04/07/2024 6:22 PM Tanya Moore  MRN:  [id number]  Chief Complaint:  Chief Complaint  Patient presents with   Follow-up   Medication Refill   HPI:   Tanya Moore is a 43 year old female with a past psychiatric history significant for attention deficit hyperactivity disorder (predominantly inattentive type) and bipolar 2 disorder (major depressive episode) who presents to [facility] via virtual video visit for follow-up and medication management.  Patient was last seen by Dr. Harl on 01/07/2024.  During her last encounter, patient was being managed on the following psychiatric medications:  Adderall XR 15 mg 24-hour capsule morning and evening Abilify  15 mg daily Sertraline  150 mg daily with breakfast  Patient presents to the encounter requesting refills on her Adderall XR.  Patient denies experiencing any adverse side effects from her use of Adderall XR.  Patient reports that it  takes roughly 30 minutes to an hour for her Adderall to kick in once she has taken it.  She states that she normally takes her first dose between 6 AM and 7 AM.  She reports taking her second dose between 1 PM and 2 PM.  She reports that the second dose of her Adderall XR last her 3 to 4 hours.  Patient reports that her use of Adderall XR is not lasting throughout the day.  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  Patient is interested in increasing her dosage of Adderall for the management of her focus, concentration, and memory.  Patient endorses depression but states that her episodes have been situational in nature.  She reports that she has been experiencing some depressive episodes due to her uncle passing away.  Patient endorses anxiety and rates her anxiety a 4.5 out of 10.  Patient denies any new stressors at this time.  A PHQ-9 screen was performed with the patient scoring a 9.  A GAD-7 screen was also performed with the patient scoring a 10.  Patient is alert and oriented x 4, calm, cooperative, and fully engaged in conversation during the encounter.  Patient endorses okay mood.  Patient exhibits depressed mood with appropriate affect.  Patient denies suicidal or homicidal ideations.  She further denies auditory or visual hallucinations and does not appear to be responding to internal/external stimuli.  Patient endorses good sleep and receives on average 7 to 8 hours of sleep per night.  Patient endorses good appetite and eats on average 3 meals per  day.  Patient denies alcohol consumption or illicit drug use.  Patient endorses tobacco use and smokes on average 10 cigarettes/day.  Visit Diagnosis:    ICD-10-CM   1. Attention deficit hyperactivity disorder (ADHD), predominantly inattentive type  F90.0 amphetamine -dextroamphetamine (ADDERALL XR) 15 MG 24 hr capsule    2. Bipolar 2 disorder, major depressive episode (HCC)  F31.81  ARIPiprazole  (ABILIFY ) 15 MG tablet    sertraline  (ZOLOFT ) 100 MG tablet      Past Psychiatric History:  Patient has a history of anxiety and was diagnosed with bipolar disorder back in 2016.  Patient has been tried on several medications in the past.  Patient was seen by her primary care provider followed by [facility].  Patient stopped taking her medications after she found out she was pregnant in 2020.  Past Medical History:  Past Medical History:  Diagnosis Date   ADHD    Anxiety    Bipolar 2 disorder (HCC)    Depression    Ectopic pregnancy with intrauterine pregnancy/Heterotopic Pregnancy 05/03/2019   S/p laparoscopic right salpingectomy on 05/03/19 05/03/19 SURGICAL PATHOLOGY FALLOPIAN TUBE WITH ECTOPIC PREGNANCY, RIGHT, SALPINGECTOMY:  - Benign fallopian tube with chorionic villi consistent with ectopic pregnancy.    Hemoperitoneum due to rupture of right tubal ectopic pregnancy 05/03/2019   History of ectopic pregnancy in 11/2018 11/20/2018   Mtx#1 given 4/3: Hcg day 0 (147), day 4 (180), day 7 (193) Mtx # 2 given 4/9 (193)->4/12 (153)->4/15 (79)   Hyperlipidemia    Infection    UTI   Pregnancy induced hypertension    Psoriasis 2020   Seasonal allergies     Past Surgical History:  Procedure Laterality Date   APPENDECTOMY     BUNIONECTOMY     CESAREAN SECTION  2017   CESAREAN SECTION N/A 11/27/2019   Procedure: CESAREAN SECTION AND IUD INSERTION;  Surgeon: Lorence Ozell CROME, MD;  Location: [facility] LD ORS;  Service: Obstetrics;  Laterality: N/A;   DIAGNOSTIC LAPAROSCOPY WITH REMOVAL OF ECTOPIC PREGNANCY N/A 05/03/2019   Procedure: RIGHT SALPINGECTOMY AND REMOVAL OF ECTOPIC PREGNANCY;  Surgeon: Herchel Gloris LABOR, MD;  Location: [facility] OR;  Service: Gynecology;  Laterality: N/A;   LAPAROSCOPIC BILATERAL SALPINGECTOMY Left 02/17/2022   Procedure: LAPAROSCOPIC LEFT SALPINGECTOMY;  Surgeon: Zina Jerilynn LABOR, MD;  Location: [facility] OR;  Service: Gynecology;  Laterality: Left;   TONSILLECTOMY      TUBAL LIGATION      Family Psychiatric History:  Sister - depression  Family History:  Family History  Problem Relation Age of Onset   Hyperlipidemia Mother    Hypertension Mother    Hyperlipidemia Father    Hypertension Father    Cancer Father        prostate   Depression Sister    Breast cancer Neg Hx     Social History:  Social History   Socioeconomic History   Marital status: Single    Spouse name: Not on file   Number of children: 2   Years of education: Not on file   Highest education level: Bachelor's degree (e.g., BA, AB, BS)  Occupational History   Not on file  Tobacco Use   Smoking status: Every Day    Current packs/day: 0.00    Types: Cigarettes    Last attempt to quit: 2017    Years since quitting: 8.7   Smokeless tobacco: Never  Vaping Use   Vaping status: Some Days  Substance and Sexual Activity   Alcohol use: No   Drug use:  No   Sexual activity: Yes    Birth control/protection: Surgical  Other Topics Concern   Not on file  Social History Narrative   Not on file   Social Drivers of Health   Financial Resource Strain: High Risk (02/25/2024)   Overall Financial Resource Strain (CARDIA)    Difficulty of Paying Living Expenses: Hard  Food Insecurity: Food Insecurity Present (02/25/2024)   Hunger Vital Sign    Worried About Running Out of Food in the Last Year: Sometimes true    Ran Out of Food in the Last Year: Sometimes true  Transportation Needs: No Transportation Needs (02/25/2024)   PRAPARE - Administrator, Civil Service (Medical): No    Lack of Transportation (Non-Medical): No  Physical Activity: Sufficiently Active (02/25/2024)   Exercise Vital Sign    Days of Exercise per Week: 5 days    Minutes of Exercise per Session: 40 min  Stress: Stress Concern Present (02/25/2024)   [employer] of Occupational Health - Occupational Stress Questionnaire    Feeling of Stress: To some extent  Social Connections: Socially Isolated  (02/25/2024)   Social Connection and Isolation Panel    Frequency of Communication with Friends and Family: More than three times a week    Frequency of Social Gatherings with Friends and Family: Three times a week    Attends Religious Services: Never    Active Member of Clubs or Organizations: No    Attends Banker Meetings: Not on file    Marital Status: Never married    Allergies:  Allergies  Allergen Reactions   Lamictal [Lamotrigine] Rash    Patient reported    Metabolic Disorder Labs: Lab Results  Component Value Date   HGBA1C 5.4 02/15/2024   No results found for: PROLACTIN Lab Results  Component Value Date   CHOL 199 08/19/2023   TRIG 82 08/19/2023   HDL 55 08/19/2023   CHOLHDL 3.6 08/19/2023   LDLCALC 129 (H) 08/19/2023   LDLCALC 98 11/30/2022   Lab Results  Component Value Date   TSH 0.698 02/15/2024   TSH 0.568 08/19/2023    Therapeutic Level Labs: No results found for: LITHIUM No results found for: VALPROATE No results found for: CBMZ  Current Medications: Current Outpatient Medications  Medication Sig Dispense Refill   amphetamine -dextroamphetamine (ADDERALL XR) 15 MG 24 hr capsule Patient to take 1 tablet in the morning and evening 60 capsule 0   ARIPiprazole  (ABILIFY ) 15 MG tablet Take 1 tablet (15 mg total) by mouth daily. 30 tablet 3   gabapentin  (NEURONTIN ) 300 MG capsule Take 1 capsule (300 mg total) by mouth at bedtime. 30 capsule 0   sertraline  (ZOLOFT ) 100 MG tablet Take 1.5 tablets (150 mg total) by mouth daily with breakfast. 45 tablet 3   No current facility-administered medications for this visit.     Musculoskeletal: Strength & Muscle Tone: within normal limits Gait & Station: normal Patient leans: N/A  Psychiatric Specialty Exam: Review of Systems  Psychiatric/Behavioral:  Positive for decreased concentration and dysphoric mood. Negative for hallucinations, self-injury, sleep disturbance and suicidal ideas.  The patient is nervous/anxious. The patient is not hyperactive.     There were no vitals taken for this visit.There is no height or weight on file to calculate BMI.  General Appearance: Casual  Eye Contact:  Good  Speech:  Clear and Coherent and Normal Rate  Volume:  Normal  Mood:  Anxious and Depressed  Affect:  Appropriate  Thought Process:  Coherent, Goal Directed, and Descriptions of Associations: Intact  Orientation:  Full (Time, Place, and Person)  Thought Content: WDL   Suicidal Thoughts:  No  Homicidal Thoughts:  No  Memory:  Immediate;   Good Recent;   Good Remote;   Good  Judgement:  Good  Insight:  Good  Psychomotor Activity:  Normal  Concentration:  Concentration: Good and Attention Span: Good  Recall:  Good  Fund of Knowledge: Good  Language: Good  Akathisia:  No  Handed:  Right  AIMS (if indicated): not done  Assets:  Communication Skills Desire for Improvement Financial Resources/Insurance Housing Intimacy Leisure Time Physical Health Social Support Vocational/Educational  ADL's:  Intact  Cognition: WNL  Sleep:  Good   Screenings: AIMS    Flowsheet Row Video Visit from 04/07/2024 in [facility]  AIMS Total Score 1   GAD-7    Flowsheet Row Video Visit from 04/07/2024 in [facility] Video Visit from 01/07/2024 in [facility] Video Visit from 10/13/2023 in [facility] Video Visit from 08/09/2023 in [facility] Video Visit from 04/14/2023 in [facility]  Total GAD-7 Score 10 8 6 6 7    PHQ2-9    Flowsheet Row Video Visit from 04/07/2024 in [facility] Office Visit from 02/25/2024 in [facility] Video Visit from 01/07/2024 in [facility] Video Visit from 10/13/2023 in [facility] Video Visit from 08/09/2023 in [facility]  PHQ-2 Total Score 2 0 1 0 0  PHQ-9 Total Score 9 5 4 2 2    Flowsheet Row Video Visit from 04/07/2024 in [facility] UC from 01/28/2024 in [facility] UC from 01/13/2024 in [facility])  C-SSRS RISK CATEGORY No Risk No Risk No Risk     Assessment and Plan:   Tanya Moore is a 43 year old female with a past psychiatric history significant for attention deficit hyperactivity disorder (predominantly inattentive type) and bipolar 2 disorder (major depressive episode) who presents to [facility] via virtual video visit for follow-up and medication management.   Patient presents to the encounter requesting refills on her Adderall.  She denies experiencing any adverse side effects from her use of Adderall but states that her current dosage has not been effective in managing her focus, concentration, and memory.  Patient reports that when she takes her second dose of Adderall during the day, it only lasts 3 to 4 hours before it starts wearing off.  Patient reports that she has locked her keys in her car and left her work computer at work several times in the past.  Patient is interested in increasing her dosage of Adderall XR.  Provider informed patient that her Adderall dosage would not be adjusted due to the patient normally being seen by Dr. Harl.  Provider explained that Dr. Harl would need to be the one to adjust her prescription.  Patient vocalized understanding.  Patient endorses some depression stating that it is typically situational in nature.  She reports that she has been depressed lately due to the recent passing of her uncle.  Patient also endorses some anxiety.  A PHQ-9 screen was performed with the patient scoring a 9.  A  GAD-7  screen was also performed with the patient scoring a 10.  Despite experiencing some depression and anxiety, patient would like to continue taking her Abilify  and sertraline  as prescribed.  Patient's medications to be e-prescribed to pharmacy of choice.  An aims assessment was performed with the patient scoring a 1.  A [place] Suicide Severity Rating Scale was performed with the patient being considered no risk.  Patient denies suicidal ideations and is able to contract for safety at this time.    Collaboration of Care: Collaboration of Care: Medication Management AEB provider managing patient's psychiatric medications and Psychiatrist AEB patient being followed by mental health provider at this facility  Patient/Guardian was advised Release of Information must be obtained prior to any record release in order to collaborate their care with an outside provider. Patient/Guardian was advised if they have not already done so to contact the registration department to sign all necessary forms in order for us  to release information regarding their care.   Consent: Patient/Guardian gives verbal consent for treatment and assignment of benefits for services provided during this visit. Patient/Guardian expressed understanding and agreed to proceed.   1. Attention deficit hyperactivity disorder (ADHD), predominantly inattentive type  - amphetamine -dextroamphetamine (ADDERALL XR) 15 MG 24 hr capsule; Patient to take 1 tablet in the morning and evening  Dispense: 60 capsule; Refill: 0  2. Bipolar 2 disorder, major depressive episode (HCC)  - ARIPiprazole  (ABILIFY ) 15 MG tablet; Take 1 tablet (15 mg total) by mouth daily.  Dispense: 30 tablet; Refill: 3 - sertraline  (ZOLOFT ) 100 MG tablet; Take 1.5 tablets (150 mg total) by mouth daily with breakfast.  Dispense: 45 tablet; Refill: 3  Patient to follow-up in 6 weeks Provider spent a total of 14 minutes with the patient/reviewing patient's chart  Reginia FORBES Bolster, PA 04/07/2024, 6:22 PM

## 2024-04-11 ENCOUNTER — Telehealth (HOSPITAL_COMMUNITY): Payer: Self-pay

## 2024-04-11 NOTE — Telephone Encounter (Signed)
 Patient pharmacy called in stating that patient insurance has rejected prescription of adderall 15 mg. Per pharmacy staff, clinical documentation is needed to determine why patient is taking this medication twice a day.

## 2024-04-17 ENCOUNTER — Telehealth (HOSPITAL_COMMUNITY): Payer: Self-pay

## 2024-04-17 NOTE — Telephone Encounter (Signed)
 Patient called in to follow up on the documentation needed for her adderall. Patient reports going three weeks without her medication.    Patient pharmacy called in last week stating that patient insurance has rejected prescription of adderall 15 mg. Per pharmacy staff, clinical documentation is needed to determine why patient is taking this medication twice a day.

## 2024-05-22 ENCOUNTER — Other Ambulatory Visit (HOSPITAL_COMMUNITY): Payer: Self-pay | Admitting: Psychiatry

## 2024-05-22 ENCOUNTER — Telehealth (HOSPITAL_COMMUNITY): Payer: Self-pay

## 2024-05-22 DIAGNOSIS — F900 Attention-deficit hyperactivity disorder, predominantly inattentive type: Secondary | ICD-10-CM

## 2024-05-22 MED ORDER — AMPHETAMINE-DEXTROAMPHET ER 15 MG PO CP24: ORAL_CAPSULE | ORAL | 0 refills | Status: DC

## 2024-05-22 NOTE — Telephone Encounter (Signed)
 Medication sent to preferred pharmacy

## 2024-05-22 NOTE — Telephone Encounter (Signed)
 Patient called in requesting refill of her Adderall 15 mg XR. Patient last seen 04/07/24.

## 2024-05-26 ENCOUNTER — Telehealth (HOSPITAL_COMMUNITY): Admitting: Psychiatry

## 2024-05-26 ENCOUNTER — Encounter (HOSPITAL_COMMUNITY): Payer: Self-pay | Admitting: Psychiatry

## 2024-05-26 DIAGNOSIS — F900 Attention-deficit hyperactivity disorder, predominantly inattentive type: Secondary | ICD-10-CM | POA: Diagnosis not present

## 2024-05-26 DIAGNOSIS — F3181 Bipolar II disorder: Secondary | ICD-10-CM | POA: Diagnosis not present

## 2024-05-26 MED ORDER — AMPHETAMINE-DEXTROAMPHET ER 20 MG PO CP24
ORAL_CAPSULE | ORAL | Status: DC
Start: 2024-05-26 — End: 2024-06-22

## 2024-05-26 MED ORDER — ARIPIPRAZOLE 15 MG PO TABS: 15.0000 mg | ORAL_TABLET | Freq: Every day | ORAL | 3 refills | Status: DC

## 2024-05-26 MED ORDER — SERTRALINE HCL 100 MG PO TABS: 150.0000 mg | ORAL_TABLET | Freq: Every day | ORAL | 3 refills | Status: DC

## 2024-05-26 NOTE — Progress Notes (Signed)
 BH MD/PA/NP OP Progress Note Virtual Visit via Video Note  I connected with Tanya Moore on 05/26/24 at  8:00 AM EDT by a video enabled telemedicine application and verified that I am speaking with the correct person using two identifiers.  Location: Patient:Home Provider: Clinic   I discussed the limitations of evaluation and management by telemedicine and the availability of in person appointments. The patient expressed understanding and agreed to proceed.  I provided 30 minutes of non-face-to-face time during this encounter.       05/26/2024 8:15 AM Tanya Moore  MRN:  [id number]  Chief Complaint: I have been working long hours and my medication wears off.   HPI: 40 yea old seen today for follow up psychiatric evaluation. She has a psychiatric history of ADHD, Bipolar 2, anxiety, and depression. She is currently managed on Adderall XR 15 mg twice daily, Zoloft  150 mg daily, and Abilify  15 mg daily.She reports her medications are somewhat effective in managing her psychiatric conditions.  Today she was well groomed, pleasant, cooperative and engaged in conversation.  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  She notes that she works 4 days a week 10-hour shifts.  She finds enjoyment in her job however reports that Adderall wears off prior to her shift ending.  She describes being forgetful, disorganized, and inattentive to things that are mentally taxing.  Provider informed patient that Adderall XR 15 mg twice daily can be increased to 20 mg twice daily.  Patient was agreeable.    Since her last visit she informed writer that her anxiety and depression continues to be well-managed.  Today provider conducted a GAD-7 the patient scored 6.  Provider also conducted PHQ-9 to be scored a 5.  She endorses adequate sleep and appetite.Today she denies SI/HI/AVH, mania,  or paranoia.   Adderall XR 15 mg BID increased to 20 mg twice daily.  Patient is not in need of  refills today.  She will continue her other medications as prescribed. No other concerns at this time. Visit Diagnosis:    ICD-10-CM   1. Bipolar 2 disorder, major depressive episode (HCC)  F31.81 ARIPiprazole  (ABILIFY ) 15 MG tablet    sertraline  (ZOLOFT ) 100 MG tablet    2. Attention deficit hyperactivity disorder (ADHD), predominantly inattentive type  F90.0 amphetamine -dextroamphetamine (ADDERALL XR) 20 MG 24 hr capsule           Past Psychiatric History: History of anxiety, was diagnosed with bipolar disorder back in 2016.  Has been tried on several her medications.  Was being seen by PCP followed by [facility].  Stopped taking medications after she found out she is pregnant in 2020.  Past Medical History:  Past Medical History:  Diagnosis Date   ADHD    Anxiety    Bipolar 2 disorder (HCC)    Depression    Ectopic pregnancy with intrauterine pregnancy/Heterotopic Pregnancy 05/03/2019   S/p laparoscopic right salpingectomy on 05/03/19 05/03/19 SURGICAL PATHOLOGY FALLOPIAN TUBE WITH ECTOPIC PREGNANCY, RIGHT, SALPINGECTOMY:  - Benign fallopian tube with chorionic villi consistent with ectopic pregnancy.    Hemoperitoneum due to rupture of right tubal ectopic pregnancy 05/03/2019   History of ectopic pregnancy in 11/2018 11/20/2018   Mtx#1 given 4/3: Hcg day 0 (147), day 4 (180), day 7 (193) Mtx # 2 given 4/9 (193)->4/12 (153)->4/15 (79)   Hyperlipidemia    Infection    UTI   Pregnancy induced hypertension    Psoriasis 2020   Seasonal  allergies     Past Surgical History:  Procedure Laterality Date   APPENDECTOMY     BUNIONECTOMY     CESAREAN SECTION  2017   CESAREAN SECTION N/A 11/27/2019   Procedure: CESAREAN SECTION AND IUD INSERTION;  Surgeon: Lorence Ozell CROME, MD;  [facility]: [facility];  Service: Obstetrics;  Laterality: N/A;   DIAGNOSTIC LAPAROSCOPY WITH REMOVAL OF ECTOPIC PREGNANCY N/A 05/03/2019   Procedure: RIGHT SALPINGECTOMY AND REMOVAL OF ECTOPIC PREGNANCY;  Surgeon:  Herchel Gloris LABOR, MD;  Location: [facility] OR;  Service: Gynecology;  Laterality: N/A;   LAPAROSCOPIC BILATERAL SALPINGECTOMY Left 02/17/2022   Procedure: LAPAROSCOPIC LEFT SALPINGECTOMY;  Surgeon: Zina Jerilynn LABOR, MD;  Location: [facility] OR;  Service: Gynecology;  Laterality: Left;   TONSILLECTOMY     TUBAL LIGATION      Family Psychiatric History: Sister depression   Family History:  Family History  Problem Relation Age of Onset   Hyperlipidemia Mother    Hypertension Mother    Hyperlipidemia Father    Hypertension Father    Cancer Father        prostate   Depression Sister    Breast cancer Neg Hx     Social History:  Social History   Socioeconomic History   Marital status: Single    Spouse name: Not on file   Number of children: 2   Years of education: Not on file   Highest education level: Bachelor's degree (e.g., BA, AB, BS)  Occupational History   Not on file  Tobacco Use   Smoking status: Every Day    Current packs/day: 0.00    Types: Cigarettes    Last attempt to quit: 2017    Years since quitting: 8.7   Smokeless tobacco: Never  Vaping Use   Vaping status: Some Days  Substance and Sexual Activity   Alcohol use: No   Drug use: No   Sexual activity: Yes    Birth control/protection: Surgical  Other Topics Concern   Not on file  Social History Narrative   Not on file   Social Drivers of Health   Financial Resource Strain: High Risk (02/25/2024)   Overall Financial Resource Strain (CARDIA)    Difficulty of Paying Living Expenses: Hard  Food Insecurity: Food Insecurity Present (02/25/2024)   Hunger Vital Sign    Worried About Running Out of Food in the Last Year: Sometimes true    Ran Out of Food in the Last Year: Sometimes true  Transportation Needs: No Transportation Needs (02/25/2024)   PRAPARE - Administrator, Civil Service (Medical): No    Lack of Transportation (Non-Medical): No  Physical Activity: Sufficiently Active (02/25/2024)   Exercise  Vital Sign    Days of Exercise per Week: 5 days    Minutes of Exercise per Session: 40 min  Stress: Stress Concern Present (02/25/2024)   [employer] of Occupational Health - Occupational Stress Questionnaire    Feeling of Stress: To some extent  Social Connections: Socially Isolated (02/25/2024)   Social Connection and Isolation Panel    Frequency of Communication with Friends and Family: More than three times a week    Frequency of Social Gatherings with Friends and Family: Three times a week    Attends Religious Services: Never    Active Member of Clubs or Organizations: No    Attends Banker Meetings: Not on file    Marital Status: Never married    Allergies:  Allergies  Allergen Reactions   Lamictal [  Lamotrigine] Rash    Patient reported    Metabolic Disorder Labs: Lab Results  Component Value Date   HGBA1C 5.4 02/15/2024   No results found for: PROLACTIN Lab Results  Component Value Date   CHOL 199 08/19/2023   TRIG 82 08/19/2023   HDL 55 08/19/2023   CHOLHDL 3.6 08/19/2023   LDLCALC 129 (H) 08/19/2023   LDLCALC 98 11/30/2022   Lab Results  Component Value Date   TSH 0.698 02/15/2024   TSH 0.568 08/19/2023    Therapeutic Level Labs: No results found for: LITHIUM No results found for: VALPROATE No results found for: CBMZ  Current Medications: Current Outpatient Medications  Medication Sig Dispense Refill   amphetamine -dextroamphetamine (ADDERALL XR) 20 MG 24 hr capsule Patient to take 1 tablet in the morning and evening     ARIPiprazole  (ABILIFY ) 15 MG tablet Take 1 tablet (15 mg total) by mouth daily. 30 tablet 3   gabapentin  (NEURONTIN ) 300 MG capsule Take 1 capsule (300 mg total) by mouth at bedtime. 30 capsule 0   sertraline  (ZOLOFT ) 100 MG tablet Take 1.5 tablets (150 mg total) by mouth daily with breakfast. 45 tablet 3   No current facility-administered medications for this visit.     Musculoskeletal: Strength & Muscle  Tone: within normal limits and telehealth visit Gait & Station: normal, telehealth visit Patient leans: N/A  Psychiatric Specialty Exam: Review of Systems  There were no vitals taken for this visit.There is no height or weight on file to calculate BMI.  General Appearance: Well Groomed  Eye Contact:  Good  Speech:  Clear and Coherent and Normal Rate  Volume:  Normal  Mood:  Euthymic  Affect:  Appropriate, Congruent, and Tearful  Thought Process:  Coherent, Goal Directed, and Linear  Orientation:  Full (Time, Place, and Person)  Thought Content: WDL and Logical   Suicidal Thoughts:  No  Homicidal Thoughts:  No  Memory:  Immediate;   Good Recent;   Good Remote;   Good  Judgement:  Good  Insight:  Good  Psychomotor Activity:  Normal  Concentration:  Concentration: Good and Attention Span: Fair  Recall:  Good  Fund of Knowledge: Good  Language: Good  Akathisia:  No  Handed:  Right  AIMS (if indicated): not done  Assets:  Communication Skills Desire for Improvement Financial Resources/Insurance Housing Intimacy Leisure Time Physical Health Social Support  ADL's:  Intact  Cognition: WNL  Sleep:  Good   Screenings: AIMS    Flowsheet Row Video Visit from 04/07/2024 in [facility]  AIMS Total Score 1   GAD-7    Flowsheet Row Video Visit from 05/26/2024 in [facility] Video Visit from 04/07/2024 in [facility] Video Visit from 01/07/2024 in [facility] Video Visit from 10/13/2023 in [facility] Video Visit from 08/09/2023 in [facility]  Total GAD-7 Score 6 10 8 6 6    PHQ2-9    Flowsheet Row Video Visit from 05/26/2024 in [facility] Video Visit from 04/07/2024 in [facility] Office Visit from 02/25/2024 in [facility] Of  [place] Video Visit from 01/07/2024 in [facility] Video Visit from 10/13/2023 in [facility]  PHQ-2 Total Score 2 2 0 1 0  PHQ-9 Total Score 5 9 5 4 2    Flowsheet  Row Video Visit from 04/07/2024 in [facility] UC from 01/28/2024 in [facility] UC from 01/13/2024 in [facility] Care at [facility])  C-SSRS RISK CATEGORY No Risk No Risk No Risk     Assessment and Plan: Patient notes that her anxiety and depression are well-managed.  She does note that she has issues with her concentration at work.Adderall XR 15 mg BID increased to 20 mg twice daily.  Patient is not in need of refills today.  She will continue her other medications as prescribed.  1. Bipolar 2 disorder, major depressive episode (HCC)  Continue- ARIPiprazole  (ABILIFY ) 15 MG tablet; Take 1 tablet (15 mg total) by mouth daily.  Dispense: 30 tablet; Refill: 3 Continue- sertraline  (ZOLOFT ) 100 MG tablet; Take 1.5 tablets (150 mg total) by mouth daily with breakfast.  Dispense: 45 tablet; Refill: 3  2. Attention deficit hyperactivity disorder (ADHD), predominantly inattentive type  Increased- amphetamine -dextroamphetamine (ADDERALL XR) 20 MG 24 hr capsule; Patient to take 1 tablet in the morning and evening    Follow up in 3 months   Zane FORBES Bach, NP 05/26/2024, 8:15 AM

## 2024-06-01 DIAGNOSIS — H5213 Myopia, bilateral: Secondary | ICD-10-CM | POA: Diagnosis not present

## 2024-06-22 ENCOUNTER — Other Ambulatory Visit (HOSPITAL_COMMUNITY): Payer: Self-pay | Admitting: Psychiatry

## 2024-06-22 ENCOUNTER — Telehealth (HOSPITAL_COMMUNITY): Payer: Self-pay

## 2024-06-22 DIAGNOSIS — F900 Attention-deficit hyperactivity disorder, predominantly inattentive type: Secondary | ICD-10-CM

## 2024-06-22 DIAGNOSIS — F3181 Bipolar II disorder: Secondary | ICD-10-CM

## 2024-06-22 MED ORDER — AMPHETAMINE-DEXTROAMPHET ER 20 MG PO CP24: 20.0000 mg | ORAL_CAPSULE | Freq: Two times a day (BID) | ORAL | 0 refills | Status: DC

## 2024-06-22 NOTE — Telephone Encounter (Signed)
 Medication sent to preferred pharmacy

## 2024-06-22 NOTE — Telephone Encounter (Signed)
 Patient called in requesting refill of Adderall 20 mg, which was increased during last visit.

## 2024-07-25 ENCOUNTER — Telehealth (HOSPITAL_COMMUNITY): Payer: Self-pay

## 2024-07-25 ENCOUNTER — Other Ambulatory Visit (HOSPITAL_COMMUNITY): Payer: Self-pay | Admitting: Psychiatry

## 2024-07-25 DIAGNOSIS — F900 Attention-deficit hyperactivity disorder, predominantly inattentive type: Secondary | ICD-10-CM

## 2024-07-25 MED ORDER — AMPHETAMINE-DEXTROAMPHET ER 20 MG PO CP24: 20.0000 mg | ORAL_CAPSULE | Freq: Two times a day (BID) | ORAL | 0 refills | Status: DC

## 2024-07-25 NOTE — Telephone Encounter (Signed)
 Medication sent to preferred pharmacy

## 2024-07-25 NOTE — Telephone Encounter (Signed)
 Requesting refill of adderall 20 mg XR

## 2024-08-14 ENCOUNTER — Telehealth (HOSPITAL_COMMUNITY): Admitting: Psychiatry

## 2024-08-14 ENCOUNTER — Encounter (HOSPITAL_COMMUNITY): Payer: Self-pay | Admitting: Psychiatry

## 2024-08-14 DIAGNOSIS — F3181 Bipolar II disorder: Secondary | ICD-10-CM | POA: Diagnosis not present

## 2024-08-14 MED ORDER — ARIPIPRAZOLE 15 MG PO TABS: 15.0000 mg | ORAL_TABLET | Freq: Every day | ORAL | 3 refills | Status: AC

## 2024-08-14 MED ORDER — SERTRALINE HCL 100 MG PO TABS: 150.0000 mg | ORAL_TABLET | Freq: Every day | ORAL | 3 refills | Status: AC

## 2024-08-14 NOTE — Progress Notes (Signed)
 BH MD/PA/NP OP Progress Note Virtual Visit via Video Note  I connected with Tanya Moore on 08/14/2024 at  3:30 PM EST by a video enabled telemedicine application and verified that I am speaking with the correct person using two identifiers.  Location: Patient:Home Provider: Home office   I discussed the limitations of evaluation and management by telemedicine and the availability of in person appointments. The patient expressed understanding and agreed to proceed.  I provided 30 minutes of non-face-to-face time during this encounter.       08/14/2024 11:09 AM Tanya Moore  MRN:  [id number]  Chief Complaint: Everything is good.   HPI: 44 year old seen today for follow up psychiatric evaluation. She has a psychiatric history of ADHD, Bipolar 2, anxiety, and depression. She is currently managed on Adderall XR 20 mg twice daily, Zoloft  150 mg daily, and Abilify  15 mg daily.She reports her medications are effective in managing her psychiatric conditions.  Today she was well groomed, pleasant, cooperative and engaged in conversation.  She informed clinical research associate that everything is good.  She notes that her current dose of Adderall is helping her focus better.  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  Over the holiday season she notes that she got engaged.  Patient notes that her children are doing well.    Since her last visit she reports that she has minimal anxiety and depression. Today provider conducted a GAD-7 the patient scored 5, at her last visit she scored a 6.  Provider also conducted PHQ-9 to be scored a 6, at her last visit she scored a 5.  She endorses adequate sleep and appetite.Today she denies SI/HI/AVH, mania,  or paranoia.   Adderall recently filled on 07/25/2024.  It is currently not due for refill.  Patient will continue her other medication as prescribed.  No other concerns at this time. Visit Diagnosis:    ICD-10-CM   1. Bipolar 2 disorder,  major depressive episode (HCC)  F31.81 sertraline  (ZOLOFT ) 100 MG tablet    ARIPiprazole  (ABILIFY ) 15 MG tablet            Past Psychiatric History: History of anxiety, was diagnosed with bipolar disorder back in 2016.  Has been tried on several her medications.  Was being seen by PCP [facility] by [facility].  Stopped taking medications after she found out she is pregnant in 2020.  Past Medical History:  Past Medical History:  Diagnosis Date   ADHD    Anxiety    Bipolar 2 disorder (HCC)    Depression    Ectopic pregnancy with intrauterine pregnancy/Heterotopic Pregnancy 05/03/2019   S/p laparoscopic right salpingectomy on 05/03/19 05/03/19 SURGICAL PATHOLOGY FALLOPIAN TUBE WITH ECTOPIC PREGNANCY, RIGHT, SALPINGECTOMY:  - Benign fallopian tube with chorionic villi consistent with ectopic pregnancy.    Hemoperitoneum due to rupture of right tubal ectopic pregnancy 05/03/2019   History of ectopic pregnancy in 11/2018 11/20/2018   Mtx#1 given 4/3: Hcg day 0 (147), day 4 (180), day 7 (193) Mtx # 2 given 4/9 (193)->4/12 (153)->4/15 (79)   Hyperlipidemia    Infection    UTI   Pregnancy induced hypertension    Psoriasis 2020   Seasonal allergies     Past Surgical History:  Procedure Laterality Date   APPENDECTOMY     BUNIONECTOMY     CESAREAN SECTION  2017   CESAREAN SECTION N/A 11/27/2019   Procedure: CESAREAN SECTION AND IUD INSERTION;  Surgeon: Lorence Ozell CROME,  MD;  Location: [facility] LD [facility]S;  Service: Obstetrics;  Laterality: N/A;   DIAGNOSTIC LAPAROSCOPY WITH REMOVAL OF ECTOPIC PREGNANCY N/A 05/03/2019   Procedure: RIGHT SALPINGECTOMY AND REMOVAL OF ECTOPIC PREGNANCY;  Surgeon: Herchel Gloris LABOR, MD;  Location: [facility] OR;  Service: Gynecology;  Laterality: N/A;   LAPAROSCOPIC BILATERAL SALPINGECTOMY Left 02/17/2022   Procedure: LAPAROSCOPIC LEFT SALPINGECTOMY;  Surgeon: Zina Jerilynn LABOR, MD;  Location: [facility] OR;  Service: Gynecology;  Laterality: Left;   TONSILLECTOMY     TUBAL LIGATION       Family Psychiatric History: Sister depression   Family History:  Family History  Problem Relation Age of Onset   Hyperlipidemia Mother    Hypertension Mother    Hyperlipidemia Father    Hypertension Father    Cancer Father        prostate   Depression Sister    Breast cancer Neg Hx     Social History:  Social History   Socioeconomic History   Marital status: Single    Spouse name: Not on file   Number of children: 2   Years of education: Not on file   Highest education level: Bachelor's degree (e.g., BA, AB, BS)  Occupational History   Not on file  Tobacco Use   Smoking status: Every Day    Current packs/day: 0.00    Average packs/day: 0.5 packs/day    Types: Cigarettes    Last attempt to quit: 2017    Years since quitting: 9.0   Smokeless tobacco: Never  Vaping Use   Vaping status: Some Days  Substance and Sexual Activity   Alcohol use: No   Drug use: No   Sexual activity: Yes    Birth control/protection: Surgical  Other Topics Concern   Not on file  Social History Narrative   Not on file   Social Drivers of Health   Tobacco Use: High Risk (08/14/2024)   Patient History    Smoking Tobacco Use: Every Day    Smokeless Tobacco Use: Never    Passive Exposure: Not on file  Financial Resource Strain: High Risk (02/25/2024)   Overall Financial Resource Strain (CARDIA)    Difficulty of Paying Living Expenses: Hard  Food Insecurity: Food Insecurity Present (02/25/2024)   Epic    Worried About Programme Researcher, Broadcasting/film/video in the Last Year: Sometimes true    Ran Out of Food in the Last Year: Sometimes true  Transportation Needs: No Transportation Needs (02/25/2024)   Epic    Lack of Transportation (Medical): No    Lack of Transportation (Non-Medical): No  Physical Activity: Sufficiently Active (02/25/2024)   Exercise Vital Sign    Days of Exercise per Week: 5 days    Minutes of Exercise per Session: 40 min  Stress: Stress Concern Present (02/25/2024)   [employer] of Occupational Health - Occupational Stress Questionnaire    Feeling of Stress: To some extent  Social Connections: Socially Isolated (02/25/2024)   Social Connection and Isolation Panel    Frequency of Communication with Friends and Family: More than three times a week    Frequency of Social Gatherings with Friends and Family: Three times a week    Attends Religious Services: Never    Active Member of Clubs or Organizations: No    Attends Banker Meetings: Not on file    Marital Status: Never married  Depression (PHQ2-9): Medium Risk (08/14/2024)   Depression (PHQ2-9)    PHQ-2 Score: 6  Alcohol Screen: Low Risk (02/25/2024)  Alcohol Screen    Last Alcohol Screening Score (AUDIT): 1  Housing: Low Risk (02/25/2024)   Epic    Unable to Pay for Housing in the Last Year: No    Number of Times Moved in the Last Year: 0    Homeless in the Last Year: No  Utilities: Not on file  Health Literacy: Not on file    Allergies:  Allergies  Allergen Reactions   Lamictal [Lamotrigine] Rash    Patient reported    Metabolic Disorder Labs: Lab Results  Component Value Date   HGBA1C 5.4 02/15/2024   No results found for: PROLACTIN Lab Results  Component Value Date   CHOL 199 08/19/2023   TRIG 82 08/19/2023   HDL 55 08/19/2023   CHOLHDL 3.6 08/19/2023   LDLCALC 129 (H) 08/19/2023   LDLCALC 98 11/30/2022   Lab Results  Component Value Date   TSH 0.698 02/15/2024   TSH 0.568 08/19/2023    Therapeutic Level Labs: No results found for: LITHIUM No results found for: VALPROATE No results found for: CBMZ  Current Medications: Current Outpatient Medications  Medication Sig Dispense Refill   amphetamine -dextroamphetamine (ADDERALL XR) 20 MG 24 hr capsule Take 1 capsule (20 mg total) by mouth 2 (two) times daily. Patient to take 1 tablet in the morning and evening 60 capsule 0   ARIPiprazole  (ABILIFY ) 15 MG tablet Take 1 tablet (15 mg total) by mouth daily.  30 tablet 3   gabapentin  (NEURONTIN ) 300 MG capsule Take 1 capsule (300 mg total) by mouth at bedtime. 30 capsule 0   sertraline  (ZOLOFT ) 100 MG tablet Take 1.5 tablets (150 mg total) by mouth daily with breakfast. 45 tablet 3   No current facility-administered medications for this visit.     Musculoskeletal: Strength & Muscle Tone: within normal limits and telehealth visit Gait & Station: normal, telehealth visit Patient leans: N/A  Psychiatric Specialty Exam: Review of Systems  There were no vitals taken for this visit.There is no height or weight on file to calculate BMI.  General Appearance: Well Groomed  Eye Contact:  Good  Speech:  Clear and Coherent and Normal Rate  Volume:  Normal  Mood:  Euthymic  Affect:  Appropriate, Congruent, and Tearful  Thought Process:  Coherent, Goal Directed, and Linear  Orientation:  Full (Time, Place, and Person)  Thought Content: WDL and Logical   Suicidal Thoughts:  No  Homicidal Thoughts:  No  Memory:  Immediate;   Good Recent;   Good Remote;   Good  Judgement:  Good  Insight:  Good  Psychomotor Activity:  Normal  Concentration:  Concentration: Good and Attention Span: Fair  Recall:  Good  Fund of Knowledge: Good  Language: Good  Akathisia:  No  Handed:  Right  AIMS (if indicated): not done  Assets:  Communication Skills Desire for Improvement Financial Resources/Insurance Housing Intimacy Leisure Time Physical Health Social Support  ADL's:  Intact  Cognition: WNL  Sleep:  Good   Screenings: AIMS    Flowsheet Row Video Visit from 04/07/2024 in [facility]  AIMS Total Score 1   GAD-7    Flowsheet Row Video Visit from 08/14/2024 in [facility] Video Visit from 05/26/2024 in [facility] Video Visit from 04/07/2024 in [facility] Video Visit from 01/07/2024 in [facility] Video Visit  from 10/13/2023 in [facility]  Total GAD-7 Score 5 6 10  8  6   PHQ2-9    Flowsheet Row Video Visit from 08/14/2024 in [facility] Video Visit from 05/26/2024 in [facility] Video Visit from 04/07/2024 in [facility] Office Visit from 02/25/2024 in [facility] Of [place]. [facility] Video Visit from 01/07/2024 in [facility]  PHQ-2 Total Score 1 2 2  0 1  PHQ-9 Total Score 6 5 9 5 4    Flowsheet Row Video Visit from 04/07/2024 in [facility] UC from 01/28/2024 in [facility] UC from 01/13/2024 in [facility] at [facility])  C-SSRS RISK CATEGORY No Risk No Risk No Risk     Assessment and Plan: Patient notes she is doing well on her current medication regimen.Adderall recently filled on 07/25/2024.  It is currently not due for refill.  Patient will continue her other medication as prescribed  1. Bipolar 2 disorder, major depressive episode (HCC)  Continue- sertraline  (ZOLOFT ) 100 MG tablet; Take 1.5 tablets (150 mg total) by mouth daily with breakfast.  Dispense: 45 tablet; Refill: 3 Continue- ARIPiprazole  (ABILIFY ) 15 MG tablet; Take 1 tablet (15 mg total) by mouth daily.  Dispense: 30 tablet; Refill: 3   Follow up in 2.5 months   Zane FORBES Bach, NP 08/14/2024, 11:09 AM

## 2024-08-25 ENCOUNTER — Other Ambulatory Visit (HOSPITAL_COMMUNITY): Payer: Self-pay | Admitting: Psychiatry

## 2024-08-25 ENCOUNTER — Telehealth (HOSPITAL_COMMUNITY): Payer: Self-pay

## 2024-08-25 DIAGNOSIS — F900 Attention-deficit hyperactivity disorder, predominantly inattentive type: Secondary | ICD-10-CM

## 2024-08-25 MED ORDER — AMPHETAMINE-DEXTROAMPHET ER 20 MG PO CP24: 20.0000 mg | ORAL_CAPSULE | Freq: Two times a day (BID) | ORAL | 0 refills | Status: AC

## 2024-08-25 NOTE — Telephone Encounter (Signed)
 Patient is calling for a refill on Adderall, last sent on 12/16. Patient has a follow up on 3/16. Please review and advise, thank you

## 2024-08-25 NOTE — Telephone Encounter (Signed)
 Medication sent to preferred pharmacy

## 2024-10-23 ENCOUNTER — Telehealth (HOSPITAL_COMMUNITY): Admitting: Psychiatry

## 2024-10-25 ENCOUNTER — Telehealth (HOSPITAL_COMMUNITY): Admitting: Psychiatry
